# Patient Record
Sex: Male | Born: 1950 | Race: Black or African American | Hispanic: No | Marital: Married | Smoking: Former smoker
Health system: Southern US, Community
[De-identification: ages and names within clinical notes are randomized; demographics above are authoritative.]

## PROBLEM LIST (undated history)

## (undated) DIAGNOSIS — G4730 Sleep apnea, unspecified: Secondary | ICD-10-CM

## (undated) DIAGNOSIS — R634 Abnormal weight loss: Secondary | ICD-10-CM

## (undated) DIAGNOSIS — T8610 Unspecified complication of kidney transplant: Secondary | ICD-10-CM

## (undated) DIAGNOSIS — I119 Hypertensive heart disease without heart failure: Secondary | ICD-10-CM

## (undated) DIAGNOSIS — E785 Hyperlipidemia, unspecified: Secondary | ICD-10-CM

## (undated) DIAGNOSIS — N189 Chronic kidney disease, unspecified: Secondary | ICD-10-CM

## (undated) DIAGNOSIS — E1043 Type 1 diabetes mellitus with diabetic autonomic (poly)neuropathy: Secondary | ICD-10-CM

## (undated) DIAGNOSIS — E1129 Type 2 diabetes mellitus with other diabetic kidney complication: Secondary | ICD-10-CM

## (undated) DIAGNOSIS — Z8619 Personal history of other infectious and parasitic diseases: Secondary | ICD-10-CM

## (undated) DIAGNOSIS — E669 Obesity, unspecified: Secondary | ICD-10-CM

## (undated) DIAGNOSIS — I951 Orthostatic hypotension: Secondary | ICD-10-CM

## (undated) DIAGNOSIS — M1990 Unspecified osteoarthritis, unspecified site: Secondary | ICD-10-CM

## (undated) DIAGNOSIS — K3184 Gastroparesis: Secondary | ICD-10-CM

## (undated) DIAGNOSIS — E13319 Other specified diabetes mellitus with unspecified diabetic retinopathy without macular edema: Secondary | ICD-10-CM

## (undated) DIAGNOSIS — D638 Anemia in other chronic diseases classified elsewhere: Secondary | ICD-10-CM

## (undated) HISTORY — DX: Abnormal weight loss: R63.4

## (undated) HISTORY — DX: Hyperlipidemia, unspecified: E78.5

## (undated) HISTORY — DX: Unspecified osteoarthritis, unspecified site: M19.90

## (undated) HISTORY — DX: Chronic kidney disease, unspecified: N18.9

## (undated) HISTORY — DX: Personal history of other infectious and parasitic diseases: Z86.19

---

## 2002-01-24 ENCOUNTER — Encounter: Payer: Self-pay | Admitting: *Deleted

## 2002-01-24 ENCOUNTER — Inpatient Hospital Stay (HOSPITAL_COMMUNITY): Admission: EM | Admit: 2002-01-24 | Discharge: 2002-01-28 | Payer: Self-pay

## 2002-01-25 ENCOUNTER — Encounter: Payer: Self-pay | Admitting: Internal Medicine

## 2002-01-27 ENCOUNTER — Encounter: Payer: Self-pay | Admitting: Internal Medicine

## 2002-02-01 ENCOUNTER — Ambulatory Visit (HOSPITAL_COMMUNITY): Admission: RE | Admit: 2002-02-01 | Discharge: 2002-02-01 | Payer: Self-pay | Admitting: Vascular Surgery

## 2002-02-01 ENCOUNTER — Encounter: Payer: Self-pay | Admitting: Vascular Surgery

## 2002-03-21 ENCOUNTER — Encounter: Payer: Self-pay | Admitting: Nephrology

## 2002-03-21 ENCOUNTER — Inpatient Hospital Stay (HOSPITAL_COMMUNITY): Admission: AD | Admit: 2002-03-21 | Discharge: 2002-03-22 | Payer: Self-pay | Admitting: Nephrology

## 2002-04-06 ENCOUNTER — Ambulatory Visit (HOSPITAL_COMMUNITY): Admission: RE | Admit: 2002-04-06 | Discharge: 2002-04-06 | Payer: Self-pay | Admitting: Nephrology

## 2002-04-14 ENCOUNTER — Inpatient Hospital Stay (HOSPITAL_COMMUNITY): Admission: RE | Admit: 2002-04-14 | Discharge: 2002-04-14 | Payer: Self-pay | Admitting: Nephrology

## 2002-04-14 ENCOUNTER — Encounter: Payer: Self-pay | Admitting: Nephrology

## 2002-05-06 ENCOUNTER — Encounter: Payer: Self-pay | Admitting: Nephrology

## 2002-05-06 ENCOUNTER — Encounter: Admission: RE | Admit: 2002-05-06 | Discharge: 2002-05-06 | Payer: Self-pay | Admitting: Nephrology

## 2002-06-22 ENCOUNTER — Encounter: Payer: Self-pay | Admitting: *Deleted

## 2002-06-22 ENCOUNTER — Encounter: Admission: RE | Admit: 2002-06-22 | Discharge: 2002-06-22 | Payer: Self-pay | Admitting: *Deleted

## 2002-06-24 ENCOUNTER — Ambulatory Visit (HOSPITAL_COMMUNITY): Admission: RE | Admit: 2002-06-24 | Discharge: 2002-06-24 | Payer: Self-pay | Admitting: *Deleted

## 2002-06-24 ENCOUNTER — Encounter: Payer: Self-pay | Admitting: *Deleted

## 2002-08-10 ENCOUNTER — Ambulatory Visit (HOSPITAL_COMMUNITY): Admission: RE | Admit: 2002-08-10 | Discharge: 2002-08-10 | Payer: Self-pay | Admitting: *Deleted

## 2002-09-14 ENCOUNTER — Ambulatory Visit (HOSPITAL_COMMUNITY): Admission: RE | Admit: 2002-09-14 | Discharge: 2002-09-14 | Payer: Self-pay | Admitting: Vascular Surgery

## 2002-11-17 HISTORY — PX: KIDNEY TRANSPLANT: SHX239

## 2002-12-05 ENCOUNTER — Ambulatory Visit (HOSPITAL_COMMUNITY): Admission: RE | Admit: 2002-12-05 | Discharge: 2002-12-05 | Payer: Self-pay | Admitting: Internal Medicine

## 2002-12-21 ENCOUNTER — Ambulatory Visit (HOSPITAL_COMMUNITY): Admission: RE | Admit: 2002-12-21 | Discharge: 2002-12-21 | Payer: Self-pay | Admitting: Nephrology

## 2002-12-21 ENCOUNTER — Encounter: Payer: Self-pay | Admitting: Nephrology

## 2003-06-02 ENCOUNTER — Ambulatory Visit (HOSPITAL_COMMUNITY): Admission: RE | Admit: 2003-06-02 | Discharge: 2003-06-02 | Payer: Self-pay | Admitting: Nephrology

## 2003-06-02 ENCOUNTER — Encounter: Payer: Self-pay | Admitting: Nephrology

## 2004-02-06 ENCOUNTER — Emergency Department (HOSPITAL_COMMUNITY): Admission: EM | Admit: 2004-02-06 | Discharge: 2004-02-06 | Payer: Self-pay | Admitting: Emergency Medicine

## 2005-03-14 ENCOUNTER — Encounter: Admission: RE | Admit: 2005-03-14 | Discharge: 2005-03-14 | Payer: Self-pay | Admitting: Nephrology

## 2005-07-15 ENCOUNTER — Encounter: Admission: RE | Admit: 2005-07-15 | Discharge: 2005-08-11 | Payer: Self-pay | Admitting: Nephrology

## 2005-09-30 ENCOUNTER — Encounter: Admission: RE | Admit: 2005-09-30 | Discharge: 2005-12-29 | Payer: Self-pay | Admitting: Nephrology

## 2006-03-24 ENCOUNTER — Encounter: Admission: RE | Admit: 2006-03-24 | Discharge: 2006-03-24 | Payer: Self-pay | Admitting: Nephrology

## 2006-10-15 ENCOUNTER — Encounter: Admission: RE | Admit: 2006-10-15 | Discharge: 2007-01-13 | Payer: Self-pay | Admitting: Nephrology

## 2006-10-27 ENCOUNTER — Encounter: Admission: RE | Admit: 2006-10-27 | Discharge: 2006-12-23 | Payer: Self-pay | Admitting: Nephrology

## 2006-11-17 LAB — HM COLONOSCOPY

## 2007-02-18 ENCOUNTER — Encounter: Admission: RE | Admit: 2007-02-18 | Discharge: 2007-02-18 | Payer: Self-pay | Admitting: Nephrology

## 2007-02-21 ENCOUNTER — Encounter: Admission: RE | Admit: 2007-02-21 | Discharge: 2007-02-21 | Payer: Self-pay | Admitting: Nephrology

## 2007-04-13 ENCOUNTER — Ambulatory Visit: Payer: Self-pay | Admitting: Cardiology

## 2007-04-20 ENCOUNTER — Encounter: Admission: RE | Admit: 2007-04-20 | Discharge: 2007-05-18 | Payer: Self-pay | Admitting: Nephrology

## 2007-04-21 ENCOUNTER — Ambulatory Visit: Payer: Self-pay

## 2007-05-04 ENCOUNTER — Ambulatory Visit: Payer: Self-pay

## 2007-05-04 ENCOUNTER — Encounter: Payer: Self-pay | Admitting: Cardiology

## 2007-05-11 ENCOUNTER — Ambulatory Visit: Payer: Self-pay | Admitting: Cardiology

## 2007-08-04 ENCOUNTER — Encounter: Admission: RE | Admit: 2007-08-04 | Discharge: 2007-08-04 | Payer: Self-pay | Admitting: Nephrology

## 2007-09-29 ENCOUNTER — Encounter: Admission: RE | Admit: 2007-09-29 | Discharge: 2007-09-29 | Payer: Self-pay | Admitting: Nephrology

## 2008-07-05 ENCOUNTER — Encounter: Admission: RE | Admit: 2008-07-05 | Discharge: 2008-08-08 | Payer: Self-pay | Admitting: Nephrology

## 2008-08-23 ENCOUNTER — Encounter: Admission: RE | Admit: 2008-08-23 | Discharge: 2008-09-26 | Payer: Self-pay | Admitting: Nephrology

## 2008-10-03 ENCOUNTER — Encounter: Admission: RE | Admit: 2008-10-03 | Discharge: 2008-10-03 | Payer: Self-pay | Admitting: Nephrology

## 2008-10-20 ENCOUNTER — Inpatient Hospital Stay (HOSPITAL_COMMUNITY): Admission: EM | Admit: 2008-10-20 | Discharge: 2008-10-23 | Payer: Self-pay | Admitting: Emergency Medicine

## 2009-02-09 ENCOUNTER — Emergency Department (HOSPITAL_COMMUNITY): Admission: EM | Admit: 2009-02-09 | Discharge: 2009-02-09 | Payer: Self-pay | Admitting: Emergency Medicine

## 2009-02-13 DIAGNOSIS — G4730 Sleep apnea, unspecified: Secondary | ICD-10-CM | POA: Insufficient documentation

## 2009-02-13 DIAGNOSIS — I517 Cardiomegaly: Secondary | ICD-10-CM

## 2009-02-13 DIAGNOSIS — E1143 Type 2 diabetes mellitus with diabetic autonomic (poly)neuropathy: Secondary | ICD-10-CM

## 2009-02-13 HISTORY — DX: Sleep apnea, unspecified: G47.30

## 2009-03-22 ENCOUNTER — Encounter: Admission: RE | Admit: 2009-03-22 | Discharge: 2009-03-22 | Payer: Self-pay | Admitting: Nephrology

## 2009-05-11 ENCOUNTER — Encounter: Payer: Self-pay | Admitting: Cardiology

## 2009-05-30 ENCOUNTER — Ambulatory Visit: Payer: Self-pay | Admitting: Cardiology

## 2009-11-13 ENCOUNTER — Encounter: Payer: Self-pay | Admitting: Cardiology

## 2009-12-04 ENCOUNTER — Encounter: Admission: RE | Admit: 2009-12-04 | Discharge: 2010-03-04 | Payer: Self-pay | Admitting: Family Medicine

## 2010-01-08 ENCOUNTER — Encounter: Payer: Self-pay | Admitting: Cardiology

## 2010-01-10 ENCOUNTER — Encounter: Payer: Self-pay | Admitting: Cardiology

## 2010-04-16 ENCOUNTER — Encounter: Payer: Self-pay | Admitting: Cardiology

## 2010-04-17 ENCOUNTER — Encounter: Payer: Self-pay | Admitting: Cardiology

## 2010-12-17 NOTE — Letter (Signed)
Summary: [facility] Office Note  [facility] Office Note   Imported By: Roderic Ovens 02/07/2010 14:57:13  _____________________________________________________________________  External Attachment:    Type:   Image     Comment:   External Document

## 2010-12-17 NOTE — Letter (Signed)
Summary: [facility] Patient Note   [facility] Patient Note   Imported By: Roderic Ovens 05/21/2010 12:34:04  _____________________________________________________________________  External Attachment:    Type:   Image     Comment:   External Document

## 2010-12-17 NOTE — Letter (Signed)
Summary: [facility] Office Note  [facility] Office Note   Imported By: Roderic Ovens 12/11/2009 11:20:16  _____________________________________________________________________  External Attachment:    Type:   Image     Comment:   External Document

## 2011-02-27 LAB — BASIC METABOLIC PANEL
Calcium: 7.8 mg/dL — ABNORMAL LOW (ref 8.4–10.5)
GFR calc Af Amer: 51 mL/min — ABNORMAL LOW (ref >60)
GFR calc non Af Amer: 42 mL/min — ABNORMAL LOW (ref >60)
Glucose, Bld: 150 mg/dL — ABNORMAL HIGH (ref 70–99)
Potassium: 3.3 mEq/L — ABNORMAL LOW (ref 3.5–5.1)
Sodium: 139 mEq/L (ref 135–145)

## 2011-02-27 LAB — GLUCOSE, CAPILLARY
Glucose-Capillary: 279 mg/dL — ABNORMAL HIGH (ref 70–99)
Glucose-Capillary: 308 mg/dL — ABNORMAL HIGH (ref 70–99)
Glucose-Capillary: 323 mg/dL — ABNORMAL HIGH (ref 70–99)
Glucose-Capillary: 75 mg/dL (ref 70–99)

## 2011-02-27 LAB — URINALYSIS, ROUTINE W REFLEX MICROSCOPIC
Bilirubin Urine: NEGATIVE
Hgb urine dipstick: NEGATIVE
Nitrite: NEGATIVE
Specific Gravity, Urine: 1.017 (ref 1.005–1.030)
pH: 5 (ref 5.0–8.0)

## 2011-02-27 LAB — CBC
HCT: 37.5 % — ABNORMAL LOW (ref 39.0–52.0)
Hemoglobin: 12.5 g/dL — ABNORMAL LOW (ref 13.0–17.0)
RBC: 4.52 MIL/uL (ref 4.22–5.81)
RDW: 13.9 % (ref 11.5–15.5)

## 2011-02-27 LAB — DIFFERENTIAL
Basophils Absolute: 0 10*3/uL (ref 0.0–0.1)
Eosinophils Relative: 2 % (ref 0–5)
Lymphocytes Relative: 12 % (ref 12–46)
Lymphs Abs: 0.6 10*3/uL — ABNORMAL LOW (ref 0.7–4.0)
Monocytes Absolute: 0.8 10*3/uL (ref 0.1–1.0)
Monocytes Relative: 15 % — ABNORMAL HIGH (ref 3–12)
Neutro Abs: 3.8 10*3/uL (ref 1.7–7.7)

## 2011-04-01 NOTE — Discharge Summary (Signed)
[name], [name]             ACCOUNT NO.:  [id number]   MEDICAL RECORD NO.:  [id number]          PATIENT TYPE:  INP   LOCATION:  4741                         FACILITY:  [facility]   PHYSICIAN:  Estelle Grumbles, MDDATE OF BIRTH:  11-06-1951   DATE OF ADMISSION:  10/20/2008  DATE OF DISCHARGE:  10/23/2008                               DISCHARGE SUMMARY   [name] DOCTOR/>  [name], M.D.   CARDIOLOGIST:  Pricilla Riffle, MD, [facility].   PRIMARY CARE PHYSICIAN:  Melvyn Novas, M.D.   DISCHARGE DIAGNOSES:  1. Unresponsiveness secondary to hypoglycemia.  2. Pneumonia likely aspiration pneumonitis.  3. Acute renal failure with history of carotid artery stents status      post renal transplantation.  4. 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  5. Hypertension.   DISCHARGE MEDICATIONS:  1. Avelox 400 mg 1 every twice a day.  2. Meclizine 12.5 mg three times a day as needed.  3. Prednisone 5 mg orally daily.  4. Imdur 30 mg orally twice a day.  5. Zocor 40 mg daily.  6. Lasix 40 mg Monday, Wednesday, and Friday.  7. Omeprazole 20 mg daily.  8. Reglan 10 mg four times a day.  9. CellCept 250 mg orally twice a day.  10.Norvasc 10 mg daily.  11.Cyclosporine 100 mg orally twice a day.  12.Levothyroxine 100 mg twice a day.  13.Aspirin 81 mg daily.  14.Lantus 15 units subcutaneous q.a.m. and 5 units subcutaneous at      bedtime.  Regular insulin according to the sliding scale regimen .   HOSPITAL COURSE:  [name] was admitted to the emergency room with  unresponsiveness.  His blood sugars came down to 40 after taking the  Lantus insulin.  He was on the scene unresponsive, EMS found his state  difficult and brought him to the emergency room with complaints listed  in the history and physical, please review the admitting note done by  Dr. __________ on October 20, 2008.  At the time of admission, he was  also found to have bilateral  basilar interstitial infiltrates concerning  for pneumonitis, suspect of initial pneumonia, which was thought likely  aspiration pneumonitis in view of the prior history of unresponsiveness.  He was started on Avelox.  Nephrology was consulted.  We continued his  home medications.  The patient's Lantus was also adjusted.  Apparently  his Lantus insulin was decreased to 18 units in the morning and then 15  units in the evening, since the blood sugars were repeatedly 90s even  after taking food.  We will decrease the Lantus to 15 units in the  morning and 5 units in the evening.  I have recommend the patient to  hold on to the Lantus if his blood sugars are low, and I also  recommended him to frequently monitor his blood sugars and use  insulin  as needed.  The patient understands and agrees with recommendations.  The patient is discharged home, he is to follow up with his primary care  physician and Dr. Sherrie Mustache.  Estelle Grumbles, MD  Electronically Signed     TP/MEDQ  D:  10/23/2008  T:  10/24/2008  Job:  [id number]   cc:   [name], M.D.  Melvyn Novas, M.D.

## 2011-04-01 NOTE — Assessment & Plan Note (Signed)
HEALTHCARE                            CARDIOLOGY OFFICE NOTE   NAME:Coe, COUNCIL MUNGUIA                    MRN:          [id number]  DATE:04/13/2007                            DOB:          [birth year]    I was asked by Dr. Arrie Aran to evaluate Casimiro Needle Olexa with multiple  cardiac risk factors and a history of congestive heart failure.  He  asked to consult specifically about looking into the possibility of  coronary disease with his multiple risk factors.   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   He has a lot of difficulty getting around, but does become short of  breath with just walking a couple hundred feet.  He denies any chest  pressure, tightness, or other ischemic symptoms.   His risk factors include obesity, type 2 diabetes, hyperlipidemia,  history of hypertension with endstage renal disease, history of renal  transplant, which is stable, remote tobacco, which he quit in 1978.  He  also has a family history of coronary disease in 2 brothers.   MEDICATIONS:  1. Lantus 45 units a day.  2. Labetalol 300 mg p.o. t.i.d.  3. Cellcept 250 mg 4 two times a day.  4. Prednisone 5 mg a day.  5. Metoclopramide 10 mg q.i.d.  6. Isosorbide 30 mg b.i.d.  7. Omeprazole 20 mg daily.  8. Simvastatin 40 mg nightly.  9. Aspirin 81 mg daily.  10.Cyclosporin 100 mg 2 times a day.  11.Lisinopril 20 mg a day.  12.Furosemide 40 mg on Monday, Wednesday, Friday.   ALLERGIES:  1. IV DYE.  2. SHELLFISH.   He gives a history of being hospitalized 6 years ago in [place] with  heart failure.  This was during the stages of his endstage hypertensive  renal disease prior to his transplant.  At that time, he had a lot of  swelling in his ankles and feet.  He does not have much trouble with  that now.   SURGICAL HISTORY:  Negative.   FAMILY HISTORY:   Premature in 2 brothers with coronary disease.   SOCIAL HISTORY:  He is a Actor.  He is married and has 8 children.  His wife is with him today.  They seem very close.   REVIEW OF SYSTEMS:  Other than his HPI, he has gastroesophageal reflux,  and has chronic breathing problems.  He apparently carries a diagnosis  of sleep apnea, but does not wear his CPAP.  He has diabetic neuropathy,  and has a lot of problems with generalized gait disturbances and  weakness.  His MRI of the brain has shown premature atrophy and chronic  microvascular changes.  No stroke or focal mass identified.   EXAMINATION:  His blood pressure is elevated today at 175/84.  His pulse  64 and regular.  His weight is 195. He is 6 feet 1 inch.  HEENT:  Normocephalic and atraumatic.  Premature graying.  He has a  beard.  PERRLA.  Extraocular  movements intact.  Sclerae are muddy.  Facial symmetry is normal.  Dentition is satisfactory.  NECK:  Supple.  Carotids are full without bruits.  There is no  thyromegaly.  Trachea is midline.  LUNGS:  Clear.  HEART:  Reveals a poorly appreciated PMI.  He has normal S1 and S2  without gallop or murmur.  ABDOMINAL EXAM:  Protuberant.  Good bowel sounds.  Organomegaly could  not be adequately assessed.  EXTREMITIES:  Reveal on 1+ pretibial edema.  Pulses are intact, but  reduced.  NEUROLOGIC:  Exam is intact.  SKIN:  Intact.   Electrocardiogram demonstrates sinus rhythm with moderate voltage  criteria for LVH.  He has ST segment changes with T wave inversion in 2,  3, and aVF, as well as V6.  He has early repolarization in other leads.   ASSESSMENT:  1. Dyspnea on exertion with probably coronary artery disease.  He has      multiple risk factors, as outlined above.  2. History of congestive heart failure pretransplant at the time of      his endstage renal disease in [place].  3. Hypertension, under questionable control.  4. Type 2 diabetes.  5. Hyperlipidemia, on a  statin (LDL recently less than 100 at 99, HDL      35, triglycerides elevated).  6. Obesity.  7. Sedentary lifestyle.  8. Remote tobacco.  9. Chronic obstructive sleep apnea with noncompliance.   RECOMMENDATIONS:  1. Adenosine rest/stress Myoview to rule out obstructive coronary      disease.  2. A 2-D echo to assess LV and RV function, as well as LV wall      thickness, and question of diastolic noncompliance.   I have asked him to get an adult blood pressure cuff, and to record  pressures for me.  I will see him back in 3 weeks to review the studies  and look at his blood pressures.  We will make further recommendations  at that time.     Thomas C. Daleen Squibb, MD, [facility]  Electronically Signed    TCW/MedQ  DD: 04/13/2007  DT: 04/13/2007  Job #: [id number]   cc:   [name], M.D.

## 2011-04-01 NOTE — H&P (Signed)
[name], PFALZGRAF NO.:  [id number]   MEDICAL RECORD NO.:  [id number]          PATIENT TYPE:  INP   LOCATION:  4741                         FACILITY:  [facility]   PHYSICIAN:  Acey Lav, MD  DATE OF BIRTH:  October 11, 1951   DATE OF ADMISSION:  10/20/2008  DATE OF DISCHARGE:                              HISTORY & PHYSICAL   duplicate dictation      Acey Lav, MD  Electronically Signed     CV/MEDQ  D:  10/20/2008  T:  10/21/2008  Job:  [id number]

## 2011-04-01 NOTE — H&P (Signed)
[name], [name]             ACCOUNT NO.:  [id number]   MEDICAL RECORD NO.:  [id number]          PATIENT TYPE:  INP   LOCATION:  4741                         FACILITY:  [facility]   PHYSICIAN:  Acey Lav, MD  DATE OF BIRTH:  25-Mar-1951   DATE OF ADMISSION:  10/20/2008  DATE OF DISCHARGE:                              HISTORY & PHYSICAL   PRIMARY CARE PHYSICIAN:  [name], M.D.   CHIEF COMPLAINT:  Unresponsiveness with hypoglycemia and dyspnea and  cough.   HISTORY OF PRESENT ILLNESS:  [name] is a 60 year old African  American gentleman with hypertension, diabetes who underwent a renal  transplant in 2004, followed closely by Dr. Arrie Aran.  He also has  chronic dyspnea and is followed by [facility].  He has felt  relatively well until this morning when he was found unresponsive with a  blood glucose of 30.  The night prior he had strayed from his normal  regimen and after checking his blood sugars at 7 p.m. prior to eating  and then taking insulin based on sliding scale to account for his meal  and for his blood sugar.  He said he ate at 8 p.m. and then checked his  blood sugar at 9 p.m.  He had been getting extra insulin on a sliding  scale for meals.  Later that evening his insulin was in the 90s and he  took 20 units of Lantus at bedtime.  In the morning his blood glucose  was found to be 30 when he was unresponsive and checked by EMS.  EMS  responded, gave him tablets under the tongue and intravenous dextrose  with resuscitation.  He came to finding tablets in his mouth that were  not dissolved.  He coughed vigorously and felt short of breath.  He was  brought to the emergency department where chest x-ray showed bilateral  infiltrates that were read as atelectasis versus infiltrates.  Initial  cardiac enzymes were negative.  EKG did not show any acute changes.  He  was not hypoxic on exam.  He is not febrile initially although his  temperature  later went to 100.3.  He had a VQ scan done which was low  probability for pulmonary embolism.  In the emergency department he was  started on Avelox and Tamiflu.  He notes no sore throat, no sinus  congestion and no cough and told he had this episode of hypoglycemia.  He states he has been eating relatively well.  He did not have fever,  abdominal pain, nausea, vomiting, or diarrhea, blood per rectum until  and had had no new symptoms until his acute hypoglycemic episode.   PAST MEDICAL HISTORY:  1. Kidney transplant 2004.  2. Diabetes mellitus.  3. Hypertension.  4. Hyperlipidemia.  5. Congestive heart failure with diastolic dysfunction.  6. Obstructive sleep apnea.  7. Tobacco use, quit 25 years ago.  8. Secondary hyperparathyroidism.  9. Obesity.   PAST SURGICAL HISTORY:  Transplant done in 2004.   FAMILY HISTORY:  Family history significant for sister dying due to  complications of chronic kidney disease.  Mother died of 63 due to  complications of diabetes. Father died at 19 of heart disease.   SOCIAL HISTORY:  He is married and lives with his wife.  He is retired  from Aeronautical engineer.   ALLERGIES:  INTRAVENOUS DYE and also to SHELLFISH.   CURRENT MEDICATIONS:  1. Meclizine 12.5 mg three times daily.  2. Prednisone 5 mg daily.  3. Isosorbide mononitrate 30 mg twice daily.  4. Zocor 40 mg daily.  5. Lasix 40 mg 1/2 tablet daily.  6. Omeprazole 20 mg daily.  7. Reglan 10 mg four times a day.  8. CellCept 250 mg 4 tablets twice daily.  9. Norvasc 10 mg daily.  10.Cyclosporin 100 mg take two tablets twice daily.  11.Labetalol 300 mg twice daily.  12.Aspirin 81 mg daily.  13.Lantus take 25 units in the morning and 20 units in the evening and      take a sliding scale insulin.   REVIEW OF SYSTEMS:  Review of systems as mentioned in history of present  illness.  Otherwise 10-point review of systems negative.   PHYSICAL EXAMINATION:  VITAL SIGNS:  Good initial vital signs  show blood  pressure 142/68 with a temperature of 97.3, pulse 78, respirations 22,  pulse ox 90% room air.  Temperature rose to 100.3 later during his  hospital stay and blood pressure rose 173/82.  GENERAL:  Pleasant gentleman, fatigued-appearing, slightly overweight.  HEENT: Normocephalic, atraumatic.  He has slightly prominent eyes.  Conjunctivae without exudate or lesions.  Pupils were constricted but  reactive to light.  Oropharynx is clear without exudate or lesion.  He  had no maxillary sinus tenderness.  CARDIOVASCULAR:  Regular rate and rhythm without murmurs, gallops or  rubs.  LUNGS:  Fairly clear to auscultation bilaterally without wheezes,  rhonchi or rales anteriorly.  There were a few crackles at the base  posteriorly.  ABDOMEN:  Soft, nondistended, nontender.  EXTREMITIES:  2+ pretibial edema.   LABORATORY DATA:  EKG showed T-wave inversions in V6, I and aVL which  were chronic and J-point elevation in V2-V3.  Chest x-ray showed  bilateral infiltrates at the bases which were read as atelectasis versus  infiltrates.  His VQ scan was negative.  He did have a positive D-dimer  but again the VQ scan was negative.   His ISTAT showed a creatinine elevated to 1.6, a BUN of 24, sodium 144,  potassium 2.6, chloride 107, glucose 110.  CBC on admission show white  count of 10.7, hemoglobin 11.9, platelets at 206,000 and an ANC of 13.5.  Cardiac markers initially were negative at 1130 hours and also at 1418.  B natriuretic peptide  was 103.  Urinalysis was negative.   ASSESSMENT/PLAN:  1. Unresponsiveness due to hypoglycemia: This is most likely due to      the patient varying from his ordinary routine of taking a meal-      based insulin on a sliding scale later after he had eaten a meal.      He may have also eaten less recently although I could not get this      from his history.  He claimed he had not had a decreased appetite      to me.  He had not had symptoms of fever,  chills or other systemic      symptoms to suggest infection prior to this episode of      hypoglycemia.  I am going to hold his Lantus at present, put  him on      sliding scale insulin, let him eat his regular diet from home.      Will need to adjust his medications upwards as he increases his      intake.  2. Bilateral bibasilar pneumonia versus atelectasis:  My guess is this      patient may have aspirated this morning with this hypoglycemic      event and may have an aspiration pneumonitis versus pneumonia.  I      think Avelox is a reasonable choice with coverage of atypicals      along with anaerobes.  Tamiflu is reasonable although he did not      have much in the way of antecedent symptoms.  He is an immune      compromised patient, however,.  Will follow him closely.  Should he      deteriorate, we will get blood cultures and we will broaden him to      cover staph aureus and gram-negative pathogens such as Pseudomonas.   1. Acute renal insufficiency is most likely prerenal.  We will check a      urine electrolytes, sodium, urea and creatinine.  We will let  Dr.      Arrie Aran of the patient's presence tomorrow morning.  2. Renal transplantation history:  I am going to continue the patient      on CellCept and cyclosporin although he may need some adjustments      based on renal function and we will have to follow his renal      function closely.  Again will talk to Dr. Arrie Aran in the      morning.  I will also continue him on his prednisone.  I am not      increasing the dose to stress dose steroids, given the fact that he      is currently hypertensive and not showing evidence of adrenal      insufficiency at present.  3. Prophylaxis.  I will put the patient on subcutaneous heparin.  4. Code status.  The patient is a full code.      Acey Lav, MD  Electronically Signed     CV/MEDQ  D:  10/20/2008  T:  10/21/2008  Job:  [id number]

## 2011-04-01 NOTE — Assessment & Plan Note (Signed)
[facility]                            CARDIOLOGY OFFICE NOTE   NAME:Berch, TAAJ HURLBUT                    MRN:          [id number]  DATE:05/11/2007                            DOB:          01/19/1951    HISTORY OF PRESENT ILLNESS:  Mr. Searcy comes in today for further  management for his dyspnea on exertion. Please see my evaluation from  Apr 13, 2007.   A 2-D echocardiogram showed left ventricular systolic function to be  normal at 60%. He had moderate left ventricular hypertrophy, probably  secondary to his hypertension. He has mild aortic root dilatation and  moderate dilatation of the left atrium. There was no important valvular  abnormalities.   His stress Myoview demonstrated and EF of 54% with possible inferior  wall hypokinesia. There was soft tissue attenuation versus scarring of  the inferior wall. There was no ischemia.   Despite my asking him to check blood pressures and bring in some  readings today, he has not done so. Unfortunately, his wife is not here  today. She seemed to be willing to take a more active interest in  checking these at the time of our initial visit.   MEDICATIONS:  His medications are unchanged.   PHYSICAL EXAMINATION:  VITAL SIGNS:  Blood pressure 141/77, pulse 62 and  regular. Weight is 295.  HEENT:  Unchanged.  NECK:  Carotid upstrokes are equal bilaterally without bruits. There is  no obvious JVD.  LUNGS:  Reveal inspiratory and expiratory rhonchi.  HEART:  Reveals a poorly appreciated PMI. Normal S1 and S2.  ABDOMEN:  Good bowel sounds.  EXTREMITIES:  1+ edema. Pulses are intact.   ASSESSMENT/PLAN:  At the present time, Mr. Farooq has no objective  evidence of obstructive coronary disease. He does have moderate left  ventricular hypertrophy, which is probably secondary to his  hypertension. I really am concerned about his blood pressure control in  general. I had a long talk with him today about  this. I encouraged him  again to get a large cuff and to check his blood pressures on a regular  basis. He can followup with Dr. Arrie Aran concerning this. I have not  made any changes in the medical program. We will plan on seeing him back  on a p.r.n. basis.   I have also advised him to be more compliant with his sleep apnea. This  could have a favorable effect on his blood pressure, as well as his  cardiac status as well.     Thomas C. Daleen Squibb, MD, [facility]  Electronically Signed    TCW/MedQ  DD: 05/11/2007  DT: 05/11/2007  Job #: [id number]   cc:   [name], M.D.

## 2011-04-04 NOTE — Op Note (Signed)
[place]  Patient:    JEFFRY, VOGELSANG Visit Number: [id number] MRN: [id number]          Service Type: DSU Location: [facility] 2860 [id number] Attending Physician:  Alyson Locket Dictated by:   Larina Earthly, M.D. Proc. Date: 02/01/02 Admit Date:  02/01/2002 Discharge Date: 02/01/2002                             Operative Report  PREOPERATIVE DIAGNOSIS:  End-stage renal disease.  POSTOPERATIVE DIAGNOSIS:  End-stage renal disease.  OPERATION PERFORMED:  Creation of left upper arm arteriovenous fistula.  Lohmeyer:  Larina Earthly, M.D.  ASSISTANT:  Tollie Pizza. Thomasena Edis, P.A.  ANESTHESIA:  MAC.  COMPLICATIONS:  None.  DISPOSITION:  To recovery room stable.  DESCRIPTION OF PROCEDURE:  The patient was taken to the operating room and placed in supine position where the area of the left arm was prepped and draped in the usual sterile fashion.  Using local anesthesia, incision was made over the antecubital space and carried down to isolate the cephalic vein and the brachial artery.  The brachial artery was good with large caliber. The cephalic vein was mobilized proximally and distally and was also of good caliber.  The vein was ligated distally and divided.  This was mobilized to the level of the brachial artery.  The artery was occluded proximally and distally, was opened with an 11 blade and extended longitudinally with Potts scissors.  The vein was cut to the appropriate length, spatulated and sewn end-to-side to the artery with a running 6-0 Prolene suture.  Clamps were removed and excellent thrill was noted.  The wound was irrigated with saline. Hemostasis with electrocautery.  Wound was closed with 3-0 Vicryl in the subcutaneous and subcuticular tissue.  Benzoin and Steri-Strips were applied. ictated by:   Larina Earthly, M.D. Attending Physician:  Alyson Locket DD:  02/01/02 TD:  02/02/02 Job: [id number]

## 2011-04-04 NOTE — Op Note (Signed)
NAME:  [name], [name]                       ACCOUNT NO.:  [id number]   MEDICAL RECORD NO.:  [id number]                   PATIENT TYPE:  OIB   LOCATION:  2899                                 FACILITY:  [facility]   PHYSICIAN:  Caralee Ates, M.D.                  DATE OF BIRTH:  Jun 24, 1951   DATE OF PROCEDURE:  08/10/2002  DATE OF DISCHARGE:  08/10/2002                                 OPERATIVE REPORT   PREOPERATIVE DIAGNOSIS:  End-stage renal disease with left brachiocephalic  fistula that has failed to mature.   POSTOPERATIVE DIAGNOSIS:  End-stage renal disease with left brachiocephalic  fistula that has failed to mature.   PROCEDURE:  Left forearm arteriovenous graft.   [place]:  Caralee Ates, M.D.   ASSISTANT:  Maple Mirza, P.A.   ANESTHESIA:  General endotracheal.   ESTIMATED BLOOD LOSS:  50 cc.   DRAINS:  None.   SPECIMENS:  None.   COMPLICATIONS:  None.   INDICATIONS:  The patient is a 60 year old black gentleman with end-stage  renal disease who had a left brachiocephalic AV fistula placed approximately  six months ago, and they have been unable to dialyze him adequately through  this fistula due to the fact that the fistula is not adequately accessible.  His arm is somewhat large and the fistula is somewhat deep; therefore,  consistent cannulation of the fistula has not been possible.  He was felt to  have good flow through the graft, and conversion to a left forearm AV graft  was felt to be the best course for his ongoing dialysis needs.   DESCRIPTION OF PROCEDURE:  The patient was brought to the operating room and  placed on the operating table in supine position with the left arm extended  on an arm board.  Following adequate IV sedation that was subsequently  converted to general endotracheal anesthesia, the left arm was draped and  prepped in sterile fashion from the axilla to the hand.  A small transverse  incision was made just below the antecubital  fossa on the anterior aspect of  the forearm incorporating the existing scar.  The wound was deepened using  the Bovie to control bleeding.  The cephalic vein where it had been  transposed to the brachial artery was identified, mobilized, and encircled  with a vessel loop.  The vein was followed down to the level of the brachial  artery.  The brachial artery was then mobilized proximally and distally to  the anastomosis.  The artery was encircled with vessel loops in Potts  fashion on the proximal and distal end.  Next a counter incision was made on  the forearm and a 4-7 tapered PTFE graft was then tunneled in the  subcutaneous plane using the Gore tunneler.  The patient was then  systemically heparinized.  Following adequate three-minute circulation time  the loops from the brachial artery were tightened  to occlude flow.  The  anastomosis was taken down and the venous outflow tract was instilled with  heparinized saline.  Next the arterial wall was debrided back to healthy-  appearing tissue, removing all hyperplastic tissue.  The 4 mm end of the  PTFE graft was then spatulated and sewn in position in an end-to-side  fashion with running 6-0 Prolene suture.  Following completion of the  anastomosis, a clamp was placed on the graft and the loops were released,  restoring flow.  There was some bleeding from the anastomotic suture line,  and this was controlled with direct pressure.  Next the cephalic vein was  then debrided back to healthy-appearing vein where no hyperplastic tissue  was seen.  The 7 mm end of the graft was then trimmed to the appropriate  length and sewn in position in end-to-end fashion with running 6-0 Prolene  suture.  Prior to completion of the anastomosis the graft was flushed and  the vein was back bled.  The anastomosis was then completed and flow was  restored.  Thirty milligrams of protamine was administered, and hemostasis  was achieved.  Both wounds were then  closed in layers with 3-0 and 4-0  Vicryl sutures.  Sterile dry dressings were applied.  The patient was then  awakened from anesthesia and transferred to the recovery room in stable  condition.  The patient tolerated the procedure well.  No complications.  All needle and sponge counts were correct.                                               Caralee Ates, M.D.    Exie Parody  D:  08/10/2002  T:  08/11/2002  Job:  [id number]   cc:   Irena Cords, M.D.

## 2011-04-04 NOTE — Discharge Summary (Signed)
[facility]  Patient:    [name], [name] Visit Number: [id number] MRN: [id number]          Service Type: MED Location: [id number] [phone] Attending Physician:  [name] Dictated by:   [name], M.D. Admit Date:  01/24/2002 Discharge Date: 01/28/2002   CC:         [name], M.D.,  [facility]  [name], M.D.   Discharge Summary  DISCHARGE DIAGNOSES: 1. Congestive heart failure exacerbation secondary to fluid overload, likely    diastolic dysfunction, resolved.  History of heart failure with a normal    two-dimensional echocardiogram during this admission (two-dimensional    echocardiogram done on January 25, 2002, with normal left ventricular size,    with normal overall left ventricular systolic function, ejection fraction    estimated to be between 55 to 65%, absent of left ventricular regional wall    motion abnormalities with mild mitral regurgitation. 2. Uncontrolled hypertension - improved.  Admitting blood pressure 150/88.    Blood pressure on discharge 102/80. 3. Uncontrolled type 2 diabetes mellitus, insulin dependent - improved.    Complicated by gastroparesis and nephropathy with episodes of hypoglycemia,    capillary blood glucoses mainly in the high 150s prior to discharge.  DISCHARGE DIAGNOSES: 1. Chronic renal insufficiency secondary to diabetes mellitus and    hypertension.  Baseline creatinine between 6 and 7.  Status post left    femoral AV shunt in October 2002 in [place].  Discharge    creatinine 7.1. 2. Macrocytic anemia with a hemoglobin of 11.6 and MCV of 99 on admission.    Likely, anemia is multifactorial including the chronic renal    insufficiency, stool guaiac was negative during hospitalization.    Patient is on Epogen therapy. 3. Remote history of cigarette smoking, quit 25 years ago, lastly allergic to    intravenous dye and shellfish.  MEDICATIONS ON DISCHARGE:  Patient is  going to take all his medications prior to admission with the following adjustments:  He has been instructed to stop the Bumex that he was taking prior to admission.  His hydralazine has been increased to 100 mg p.o. t.i.d.  His Procardia XL has been increased to 90 mg p.o. q.d.  He has been started on Lasix, a new medicine, 18 mg, two tablets t.i.d.  He is going to continue the rest of his medications as previously. His medications are: 1. Insulin 70/30 13 units in the morning and 10 units in the evening. 2. PhosLo 67 mg t.i.d. 3. Imdur 30 mg p.o. q.d. 4. Cardura 2 mg p.o. q.h.s. 5. Reglan 5 mg p.o. a.c. and h.s.  DISCHARGE LABS:  WBC count 5.2, hemoglobin 11.2, hematocrit 34.6, MCV 78.9, platelet count 278, sodium 135, potassium 3.8, chloride 98, bicarbonate 28, glucose 119, BUN 79, creatinine 7.1.  PROCEDURES:  A 2-D echocardiogram.  CONSULTANTS:  [name], M.D., of [facility].  CONDITION ON DISCHARGE:  Improved.  ACTIVITY:  As tolerated.  DIET:  A 2000-calorie ADA and cardiac diet as previously.  INSTRUCTIONS:  Call physician if he experiences any dizziness, shortness of breath, nausea, vomiting, or chest pain.  Follow up in one month will be with [name] of [facility], address as mentioned above, on February 02, 2002, at 11 a.m.  Patient should call Dr. Heloise Rowland office as discussed with him previously by Dr. Darrick Rowland for follow-up appointment.  I have given patient Dr. Heloise Rowland office number, [phone].  She will  then need to speak to him prior to this appointment.  HISTORY OF PRESENT ILLNESS:  The patient is a pleasant 60 year old gentleman who was visiting from [place] looking for a home in [place], thinking of relocating in this area, who presented with a three-day history of worsening shortness of breath, orthopnea, GI cough, and dyspnea on exertion.  The patient stated on admission that, over the last one week prior to  admission, he has had meals with slightly increased salt content that were not recommended by his primary caregiver in [place].  He denied any history of chills, hemoptysis, or chest pain.  At the time of admission, he admitted to increasing lower extremity edema as well as increasing abdominal gas.  PHYSICAL EXAMINATION AT TIME OF ADMISSION:  GENERAL:  He was alert and oriented x 3.  VITAL SIGNS:  Blood pressure 186/88 with a heart rate of 84, respiratory rate of 20, oxygen saturation of 92% on room air with a temperature of 97.3.  HEENT:  Neck exam notable for increased JVD at about 2 cm at 30 degrees.  LUNGS:  He had bilateral crackles up to upper third bilaterally on chest exam with fair air movement without any wheezes and a 1/6 systolic ejection murmur at the base without any gallops.  LABORATORY DATA:  X-ray done revealed cardiomegaly with pulmonary vascular congestion.  An EKG showed normal sinus rhythm without any acute ST wave changes.  Patient was therefore admitted for evaluation and management of history of decompensation.  HOSPITAL COURSE:  Patient was started on aggressive diuresis with Lasix up to 160 mg p.o. t.i.d., IV initially then subsequently switched to p.o. with monitoring of his electrolytes for obvious reasons.  He was also admitted to a telemetry bed and a cardiac enzymes was obtained to rule out any possible myocardial ischemia, ______ ______factor which was negative.  He had a 2-D echo which did not reveal any systolic dysfunction at, at this point, I believe that the likely cause of the heart failure is diastolic dysfunction - patient is currently on calcium channel blocker, that is, Procardia XL at 90 mg q.d.  At the time of discharge, patient was doing well on p.o. diuresis.  He was not  experiencing anymore decompensation or shortness of breath.  His x-rays have improved compared to x-rays on admission with respect to the pulmonary vascular  congestion.  In view of his renal insufficiency, we called renal to assist with this evaluation.  Dr. Darrick Rowland was good enough to see the patient while he was in house and we determined that his renal insufficiency was not new and there was no acute progression.  Hemodialysis was therefore deferred and patient will be followed up in [facility] by Dr. Heloise Rowland office with respect to initiation of hemodialysis in the future.  He has an ______ level which will need to be revised by CVTS during outpatient visits for possible hemodialysis in the very near future.  Next is uncontrolled hypertension.  Patients blood pressures were very high around a systolic of 180 to 190s.  At the time of discharge, his systolic blood pressure was 150 on multiple medications.  He would need an outpatient follow-up of his blood pressure and his medications would be titrated as needed since he is a diabetic and his optimum blood pressure has not yet been achieved, but I believe this can be done as an outpatient.  Patient will follow up with [name] as mentioned above on March 19 at 11 a.m.  at which time his electrolytes should be checked in view of his aggressive diuresis and the diuretic medication adjusted as needed.  He will also need evaluation of his blood pressures and medications, antihypertensive adjusted as needed, as well.  Patient is a diabetic and, therefore, his target systolic blood pressure should be somewhere around 120 mmHg. Dictated by:   [name], M.D. Attending Physician:  [name] DD:  01/28/02 TD:  01/29/02 Job: [id number]

## 2011-04-04 NOTE — Op Note (Signed)
[place]  Patient:    [name], [name] Visit Number: [id number] MRN: [id number]          Service Type: MED Location: [phone] Attending Physician:  [name] Dictated by:   Llana Aliment. Deterding, M.D. Admit Date:  03/21/2002 Discharge Date: 03/22/2002   CC:         [name]   Operative Report  PROCEDURE:  [name] Rowland insertion.  INDICATION:  Access for hemodialysis.  DESCRIPTION OF PROCEDURE:  The procedure explained to the patient and both benefits and risk were understood and accepted.  The patient was taken to the fluoroscopy suite, placed on the fluoroscopy table in supine position with a towel roll between his shoulders.  Initially the internal jugular vein was located in the middle one-third of the sternocleidomastoid triangle with the Site-Rite ultrasound device to locate the patency, depth, and position. Subsequently the right side of the neck and right upper chest were prepped with Betadine.  Xylocaine 2% local anesthesia was used to numb up the area over the sternocleidomastoid triangle and in an inferolateral fashion approximately 8 x 4 cm.  Using the Site-Rite ultrasound device in a sterile fashion, the internal jugular vein was cannulated with the 18-gauge thin-walled needle and a J-tipped guidewire advanced through that 18-gauge thin-walled needle into the internal jugular vein, superior vena cava, and inferior vena cava under fluoroscopic guidance.  The 18-gauge thin-walled needle was withdrawn, and the opening where the guidewire entered the skin was dilated by sharp dissection approximately 1 cm.  Serial dilators were placed over the guidewire under fluoroscopic guidance starting with a 10 [place], then going through 12 and 14 [place] to lessen resistance in the skin, subcutaneous tissue, and vessel wall.  Subsequently an 57 French trocar with tear-away sheath was placed over the guidewire and  advanced into the internal jugular vein and superior vena cava under fluoroscopic guidance.  A nonserrated dialysis clamp was placed around the trocar and sheath just outside the skin. Both lumens of the double-lumen 16 cm [name] were flushed with saline, clamped, and screw-in tunneling devices placed in the distal ends.  The trocar was then withdrawn from the sheath, the sheath clamped with a dialysis clamp, and both lumens of the double-lumen Schoen catheter started through the distal end of the sheath with the venous lumen being lateral.  The sheath was unclamped then, and the catheter was advanced through the remainder of the sheath into the internal jugular vein and superior vena cava with the tips coming to rest in the superior vena cava-right atrial junction.  The tear-away sheath was removed and subsequently the guidewire removed.  Position was confirmed.  The tunneling device was then advanced in a 180 degree arc to facilitate tunnel formation.  Two parallel curvilinear tunnels were formed, initially in a superior direction, then lateral, then inferior, with the tips coming out 3 cm inferior to the clavicle and 7 cm inferolateral to the vein entry, 2 cm apart.  The catheters were positioned, and it was confirmed there were no kinks in the catheters.  The catheters were then clamped, cut off, and friction screw-in connecting devices applied to the dsital ends.  The catheter was then flushed and heparin-locked with concentrated heparin.  The skin over the vein entry was closed using 3-0 silk, and a pursestring suture was taken around each catheter with 3-0 silk also.  A Hypafix dressing was applied. Dictated by:   Llana Aliment. Deterding, M.D.  Attending Physician:  [name] DD:  03/21/02 TD:  03/23/02 Job: [id number]

## 2011-04-04 NOTE — H&P (Signed)
[facility]  Patient:    [name], [name] Visit Number: [id number] MRN: [id number]          Service Type: MED Location: 3W [phone] Attending Physician:  Rosanne Sack Dictated by:   Rosanne Sack, M.D. Admit Date:  01/24/2002   CC:         [facility]  Garnetta Buddy, M.D.  James L. Deterding, M.D.   History and Physical  DATE OF BIRTH:  Jan 13, 1951  PROBLEM LIST:  1. Congestive heart failure exacerbation secondary to fluid overload.     a. History of congestive heart failure, no information about ejection        fraction, no history of coronary artery disease.  2. Uncontrolled hypertension, blood pressure 186/88.  3. Uncontrolled type 2 diabetes mellitus, insulin-dependent.     a. Currently hypoglycemia.     b. Gastroparesis.     c. Nephropathy.  4. Chronic renal insufficiency secondary to diabetes hypertension.     a. Baseline creatinine between 6.0 and 7.0.     b. Status post left forearm arteriovenous shunt in October 2002, in [place].  5. Microcytic anemia.     a. Hemoglobin 11.6, MCV 79.     b. On iron supplementation, question on EPO therapy.  6. Recent upper respiratory symptoms about two weeks ago, treated with     antibiotic therapy.  7. History of occasional chest pain, question of unstable angina, no cardiac     workup done in the past.  8. Obesity.  9. Remote history of smoking, quit about 25 years ago. 10. Allergy to INTRAVENOUS DYE and SHELLFISH.  CHIEF COMPLAINT:  Shortness of breath.  HISTORY OF PRESENT ILLNESS:  [name] is a very pleasant 60 year old gentleman visiting from [place] who presents with a three-day history of orthopnea, dry cough, and increased dyspnea on exertion.  The patient states that over the last week or so he has had meals with slightly increased salt content that were not recommended by his primary care Maeley Matton.  The patient denies fever,  chills.  No hemoptysis.  No chest pain.  He describes lower extremity edema.  He complains of increased abdominal girth though no abdominal pain.  The patient complains of constipation.  No night sweats, no weight loss.  No increased lower extremity swelling from baseline.  The patient has been here in [place] for already a week.  No lower extremity pain or unusual swelling or skin rash.  No back pain.  No chest pain.  No focal weakness or swallowing problems.  No urinary symptoms.  PAST MEDICAL HISTORY:  As in problem list.  ALLERGIES:  As in problem list.  MEDICATIONS:  1. Insulin 70/30, 30 units in the morning, 10 units in the evening if needed     only.  2. PhosLo 667 mg t.i.d.  3. Isosorbide 30 mg p.o. q.d.  4. Nifedical XL 60 mg p.o. q.d.  5. Doxazosin 2 mg p.o. q.h.s.  6. Zaroxolyn 2.5 mg, 1-2 tablets q.d.  7. Bumex 2 mg p.o. q.d.  8. Hydralazine 10 mg p.o. b.i.d.  9. Reglan 5 mg p.o. a.c. and h.s. 10. Iron sulfate 325 mg p.o. t.i.d.  FAMILY HISTORY:  Significant for diabetes.  All his brothers and sisters, along with his parents, had diabetes.  The same thing with hypertension; everybody has hypertension.  No strokes in the family.  One of his brothers had an acute MI  when he was 35 years old.  No malignancy in the family.  SOCIAL HISTORY:  The patient is married for the second time.  He has six grown children from the first marriage.  He quit smoking about 25 years ago.  No alcohol use.  The patient is disabled.  REVIEW OF SYSTEMS:  As in HPI.  PHYSICAL EXAMINATION:  VITAL SIGNS:  Temperature 97.3, blood pressure 186/88, heart rate 84, respiratory rate 20, oxygen saturation 92% on room air.  HEENT:  Normocephalic, atraumatic.  Sclerae clear.  Conjunctivae within the normal limits.  PERRLA.  EOMI.  Funduscopic exam negative for papilledema or hemorrhages.  TMs within the normal limits.  Moist mucous membranes. Oropharynx clear.  NECK:  Supple.  JVD is present  about 30 degrees, about 2 cm.  No bruits, adenopathy, or thyromegaly.  LUNGS:  Bilateral crackles one-third up bilaterally.  No wheezing.  Fair air movement bilaterally.  CARDIAC:  Irregular rate and rhythm with 1/6 systolic ejection murmur at the base.  No S3.  No rubs.  ABDOMEN:  Obese, nontender, nondistended.  Bowel sounds were present.  No hepatosplenomegaly, no rebound, no guarding.  No bruits or masses.  GENITOURINARY:  Within the normal limits.  RECTAL:  Empty vault.  Normal sphincter tone.  Normal prostatic exam.  EXTREMITIES:  With 2+ pitting edema bilaterally.  No cord signs.  No tenderness.  No cyanosis or clubbing.  Pulses 1+ bilaterally.  The left forearm has a fistula that is ______.  NEUROLOGIC:  Alert and oriented x3.  Strength 5/5 in all extremities.  DTRs 3/5 in all extremities.  Cranial nerves II-XII intact.  Sensory intact. Plantar reflexes downgoing bilaterally.  LABORATORY DATA:  Chest x-ray consistent with cardiomegaly and pulmonary edema.  EKG:  Normal sinus rhythm with a heart rate of 80, normal axis.  No ST segment changes.  T-wave inversion in inferior leads.  No previous EKG to compare with.  No Q-waves.  CK 555, CK-MB 5.3, troponin I pending.  Sodium 138, potassium 3.8, chloride 108, CO2 24, BUN ______ , creatinine 6.2, calcium 8.0, total protein 7.6, albumin 3.6, SGOT 16, SGPT 12, alkaline phosphatase 56, total bilirubin 0.5. Hemoglobin 11.6, MCV 79, WBC 4.6, platelets 268.  ASSESSMENT AND PLAN: 1. Congestive heart failure exacerbation:  Given as described in the history    of present illness section, I suspect fluid overload associated with    chronic renal insufficiency along with increased salt intake as the main    reason for the patients congestive heart failure.  Also, a possible     cardiomyopathy could be present given the patients history of diabetes    hypertension.  There is no previous history of alcohol use or abuse nor     illegal drug use.  Currently, the patient denies any chest pain.  The    electrocardiogram is nonischemic.  The first set of cardiac enzymes is    negative.  Our plan is to admit the patient to a telemetry bed for    aggressive diuresis.  Will start Lasix 120 mg IV q.6h. and will keep    Zaroxolyn.  I spoke with Dr. Hyman Hopes, nephrologist, who agreed with this plan.    Currently the renal function seems to be stable, and the serum potassium    level is within the normal limits.  Will follow up the fluid balance along    with the daily weights.  At this point there is no need for hemodialysis as  the patient has had a positive response to a low dose of Lasix given by the    emergency room physician.  For completion, will obtain the cardiac enzymes    x 3 to rule out myocardial infarction, and a two-dimensional echocardiogram    will be obtained in the morning.  Further information will be obtained from    the patients primary care Aitana Burry in [place] tomorrow morning. 2. Uncontrolled hypertension:  His blood pressure is likely to be associated    with a fluid overload and the stress associated with his congestive heart    failure.  Our plan is aggressive diuresis as described in problem #1.  Will    continue hydralazine, doxazosin, the calcium-channel blocker, and    Zaroxolyn that the patient was taking from home.  Will continue following    up the patients blood pressure closely. 3. Uncontrolled diabetes mellitus:  Currently his CBG is 60 without clinical    symptoms of hypoglycemia.  The patient has had decreased p.o. intake due    to problem #1.  For now, will decrease the 70/30 from 30 units to 20 units.    Will use sliding scale a.c. and h.s. for now. 4. Chronic renal insufficiency:  The creatinine is currently at baseline. As    described in problem #1, the patient has a fair urine output and,    therefore, hemodialysis is not indicated at this point.  Will continue    following  closely the renal function along with the serum potassium level    with aggressive diuresis.  A nephrology consult has already been obtained. 5. Microcytic anemia:  There is no evidence of gastrointestinal bleed.  I    suspect a combination of anemia due to chronic renal insufficiency and    iron deficiency.  Will continue the iron supplementation.  Will obtain    the patients primary care providers records about the need of continuing    EPO shots.  Will guaiac stools.  Will repeat another hemoglobin in the    morning. Dictated by:   Rosanne Sack, M.D. Attending Physician:  Rosanne Sack DD:  01/24/02 TD:  01/25/02 Job: [id number]

## 2011-04-04 NOTE — H&P (Signed)
[place]  Patient:    [name], [name] Visit Number: [id number] MRN: [id number]          Service Type: MED Location: [phone] Attending Physician:  Irena Cords Dictated by:   Irena Cords, M.D. Admit Date:  03/21/2002                           History and Physical  REASON FOR ADMISSION:  Uremia, congestive heart failure.  HISTORY OF PRESENT ILLNESS:  The patient is a 60 year old African-American male with a past medical history significant for longstanding diabetes mellitus, complicated by retinopathy and nephropathy, who has had progressive renal insufficiency over the last two years.  He also has a longstanding history of hypertension, and was most recently admitted in March 2003, with congestive heart failure and worsening renal function.  He has been followed as an outpatient since his last discharge in March, in which his creatinine had peaked to 8.1, and was near starting dialysis.  He has improved with aggressive IV diuretics; however, his renal function has still remained less than 20 cc a minute.  Since he has been following up, he has had progressive increase in his lethargy, weakness, fatigue, and now with increasing nausea. He also reports that last night he had to sleep in a rocking chair because he could not lie flat due to shortness of breath and a choking sensation.  He was instructed to report to our clinic this morning for stat labs and an evaluation.  At the present time he denies any chest pain, fevers, or chills, but does have some increasing nausea.  No vomiting.  He has some orthopnea. He denies any dysuria, pyuria, or hematuria.  PAST MEDICAL HISTORY  1. Diabetes mellitus, insulin-requiring, longstanding for 25 years.  2. Chronic kidney disease, secondary to diabetic nephropathy and     hypertension.  He has a left forearm AV fistula that was immature;     however, a revision done of his left  upper extremity on February 01, 2002.  His most recent creatinine was 7.  3. Congestive heart failure and diastolic dysfunction.  4. Hypertension.  5. Anemia of chronic kidney disease.  6. Tobacco abuse, quit 25 years ago.  7. Secondary hyperparathyroidism, with PTH of 155.  8. Obesity.  CURRENT MEDICATIONS  1. Hydralazine 100 mg t.i.d.  2. Procardia XL 90 mg q.d.  3. Lasix 80 mg q.d.  4. Insulin 70/30, 25 units q.a.m., 10 units q. evening.  5. PhosLo 667 mg, one q.a.c.  6. Imdur 30 mg q.d.  7. Cardura 4 mg b.i.d.  8. Prevacid 15 mg q.d.  9. Multivitamin one q.d. 10. Epo 10,000 units subcu q. week.  ALLERGIES:  IV DYE and SHELL FISH.  FAMILY HISTORY:  He has a family history significant for a sister dying of complications of kidney disease.  Mother died at age 62 due to complications of diabetes mellitus.  Father died at age 33 of heart disease.  He has 14 siblings, and all but one have diabetes mellitus.  Only 11 siblings are alive. He has a positive family history of heart disease.  SOCIAL HISTORY:  He is married.  He recently moved here from [place].  He is retired from a [employer].  REVIEW OF SYSTEMS:  As per the HPI.  GENERAL:  He reports anorexia and a decreased appetite.  OPHTHALMIC:  Denies  any blurred vision, photophobia. ENT:  Denies any tinnitus, dysphagia, or odynophagia.  CARDIAC:  Denies any palpitations or chest pain, but does have orthopnea and increased dyspnea on exertion.  PULMONARY:  Has some shortness of breath.  Denies any hemoptysis or productive cough.  GI:  Has some nausea, but no vomiting, no hematochezia, melena, or bright red blood per rectum.  GENITOURINARY:  Denies any dysuria, pyuria, or hematuria.  MUSCULOSKELETAL:  Denies any arthralgias or myalgias. DERMATOLOGIC:  Denies any rashes, lumps, or bumps.  HEMATOLOGIC:  Denies any abnormal bleeding or bruising.  ENDOCRINE:  Denies any polyuria, polydipsia, or polyphagia.   All other systems are negative.  PHYSICAL EXAMINATION  GENERAL:  He is a well-developed, obese man, who appears in mild distress.  VITAL SIGNS:  Blood pressure 164/68, pulse 90, weight 289 pounds.  HEENT:  Head normocephalic, atraumatic.  Oropharynx without lesions.  NECK:  Supple, full range of motion.  No lymphadenopathy.  LUNGS:  Bilateral crackles one-third of the way up.  CARDIAC:  A regular rate and rhythm.  No precordial rub appreciated.  ABDOMEN:  Normoactive bowel sounds, soft, nontender, obese.  EXTREMITIES:  He has 1-2+ pretibial edema bilaterally to the knees.  In his left upper extremity he has a forearm AV fistula which is immature.  He has a left upper extremity AV fistula with a palpable thrill and audible bruit, but is immature and thin-walled.  LABORATORY DATA:  At this time are pending.  ASSESSMENT/PLAN 1. Progressive nausea, vomiting, and shortness of breath.  It appears    that the patient has had progressive kidney disease and is currently    having signs and symptoms of uremia.  PLAN:  We are awaiting labs at this time; however, the plan is to admit the patient to telemetry and to establish access for hemodialysis through the use of a Diateck permanent catheter and initiate hemodialysis with volume removal at this time.  Will also rule out for a myocardial infarction, given his progressive symptoms.  2. Hypertension.  The blood pressure is poorly controlled.  PLAN:  Will follow after the initiation of hemodialysis with ultrafiltration.  3. Anemia.  PLAN:  Will continue with Epo, and will convert him to IV following dialysis.  5. Secondary hyperparathyroidism.  PLAN:  Will continue to follow calcium and phosphorus.  Will hold off on vitamin D for now, as his level is less than 150.  Dictated by:   Irena Cords, M.D. Attending Physician:  Irena Cords DD:  03/21/02 TD:  03/21/02 Job: [id number]

## 2011-04-04 NOTE — Consult Note (Signed)
[facility]  Patient:    [name], [name] Visit Number: [id number] MRN: [id number]          Service Type: MED Location: [phone] Attending Physician:  Jackie Plum Dictated by:   Llana Aliment. Deterding, M.D. Admit Date:  01/24/2002 Discharge Date: 01/28/2002   CC:         Gerarda Fraction, M.D.   Consultation Report  REASON FOR CONSULTATION:  Chronic renal insufficiency with congestive heart failure.  CONSULTING PHYSICIAN:  Jackie Plum, M.D.  HISTORY OF PRESENT ILLNESS:  This is a 60 year old gentleman with an over 25-year history of diabetes mellitus, 7-8 year history of hypertension, history of progressive renal insufficiency, known at least 2 years. These last 2 years have been very complicated by at least 10 episodes of congestive heart failure requiring hospitalization. He is followed by a nephrologist at a primary care physician in [place]. He had fistula placed in October 2002, which the Feng said it was "okay for use." He knows the dialysis was imminent, however, was told last month that his kidneys were better and he does not need it soon. He is moving to [place] and is down here to find a place to live; he is visiting with his relatives. He has now had 4 days of progressive shortness of breath with PND, orthopnea, having to sit up to breathe, short of breath with any exertion. He has had a cough without phlegm. He has had noticed increased ankle edema and increased abdominal girth. He has had no fevers or chills, nausea, vomiting, or muscle cramping. He has had some itching.  He has no dysuria or hematuria. He has had no history of stones or urinary tract infections or veneral disease. He has a positive family history of renal disease in a sister who died of complications of kidney disease. Mother died at the age of 6 with complications of diabetes. Father died at age 4 of heart disease. He has 14 siblings, and all  but 1 have diabetes; only 11 are alive. He has a positive family history of heart disease.  He has had a history of chest pain on exertion and is on Isordil.  MEDICATION REGIMEN:  1. Nifedipine XL 60 mg b.i.d.  2. Insulin 73, 30 units in the morning, 10 units h.s.  3. Hydralazine 100 mg b.i.d.  4. PhosLo 667 mg t.i.d.  5. Isordil 30 mg per day p.o.  6. Reglan 10 mg b.i.d.  7. Zaroxolyn 2.5 mg a day.  8. Bumex 2 mg b.i.d.  9. Doxazosin 12 mg h.s. 10. Iron sulfate b.i.d. p.o. 10,000 units each week. 11. He has been getting the EPO for about a year. 12. He has a CPAP mask at night. He has to do that because he     has sleep apnea.  REVIEW OF SYSTEMS:  HEENT:  He wears glasses; has had cataracts but denies any laser therapy. HEARING:  No abnormalities. He has a dry mouth. PULMONARY:  He smoked about 5 years almost 30 years ago but has not smoked since. He does has the sleep apnea. He has no history of asthma. GI:  He occasionally has indigestion or heartburn; occasional constipation. No history of hepatitis or yellow jaundice. Skin is very dry, and he has a lot of itching. MUSCULOSKELETAL:  No real arthritis. He has been lucky from that standpoint, he says. NEUROLOGICAL:  Unremarkable. He says his sugars usually run relatively low lately in the 70-120 range.  PAST MEDICAL  HISTORY:  Includes the illnesses listed above.  PAST SURGICAL HISTORY:  He has had no operations except for the fistula.  ALLERGIES:  SHELLFISH and IODINE.  FAMILY HISTORY:  As listed above.  SOCIAL HISTORY:  He is retired from a Barista. He is here visiting relatives.  OBJECTIVE/PHYSICAL EXAMINATION:  GENERAL:  He is edematous. He is on oxygen. He is not short of breath sitting.  VITAL SIGNS:  Blood pressure sitting is 184/88 going to 196/94 standing.  HEENT:  Diabetic retinopathy that is mild.  CARDIOVASCULAR:  Regular rhythm, S4. Grade 3/6 whole systolic murmur, heard best at  the apex. PMI is 14 cm lateral to the mid sternal line at the 5th intercostal space. There is 3-4+ edema. No bruits noted. He has 1+ dorsalis pedis pulses.  LUNGS:  Crackles in both bases to the mid lungs.  ABDOMEN:  Obese, positive bowel sounds. Liver is down 7 cm.  SKIN:  Somewhat dry.  MUSCULOSKELETAL:  Unremarkable.  EXTREMITIES:  He has an AV fistula on examination on his left forearm which is very small and a ________ fistula.  LABORATORY AND ACCESSORY DATA:  Hemoglobin 11.2, white count 5200, platelets 278,000. Chemistries reveal sodium 138, potassium 3.7, chloride 107, bicarb 26, creatinine 6.3, BUN 64, glucose 154, albumin 3.5, calcium 8. CK 555 with MBs serially of 5.3, 4.6, and 4.7.  Chest x-ray shows CHF.  ASSESSMENT: 1. Chronic renal failure, apparently progressive diabetic nephropathy,    no records to substantiate this. He has had really recurrent    episodes of congestive heart failure, and getting salt water excesses.    His GFR is only 15 cc a minute. He is not uremic at this time, but he    has volume overload. His acid-base status is okay. He is anemic. He    will need maximal diuretic doses with his decreased GFR. He may need    hemodialysis soon. His AV fistula seems adequate and we have asked    CVTS to see him. He will need that revised as soon as possible. That    will have to be done at [facility] because of the lack of    vascular services at [facility]. Need to assess more of his    baseline status in terms of his anemia and renal function. 2. Hypertension. Volume excess is one of the big issues here as he is    20-30 pounds up. 3. Positive CK, needs workup. Must worry if he has had a serially    depressed ejection fraction with this recurrent congestive heart failure. 4. Diabetes mellitus. Insulin needs will be decreasing with his    decreasing GFR. 5. Anemia. Need to check his iron TIBC. 6. Setting of hyperthyroidism.  7.  Sleep apnea. 8. Obesity.  PLAN: 1. Maximize his diuresis. 2. CVTS to see him. 3. Iron TIBC. 4. PTH. 5. Use a large blood pressure cuff. 6. Diet. 7. Serum and urine protein electrophoresis. 8. We need his records at this point. 9. Cardiac workup.  ADDENDUM:  We may have to transfer him to [facility] to get this surgery done, if he is still in the hospital. Dictated by:   Llana Aliment. Deterding, M.D. Attending Physician:  Jackie Plum DD:  01/25/02 TD:  01/26/02 Job: [id number]

## 2011-08-21 LAB — CBC
HCT: 32.8 % — ABNORMAL LOW (ref 39.0–52.0)
HCT: 36.7 % — ABNORMAL LOW (ref 39.0–52.0)
Hemoglobin: 10.3 g/dL — ABNORMAL LOW (ref 13.0–17.0)
Hemoglobin: 10.8 g/dL — ABNORMAL LOW (ref 13.0–17.0)
MCHC: 32.3 g/dL (ref 30.0–36.0)
MCHC: 32.5 g/dL (ref 30.0–36.0)
MCHC: 32.7 g/dL (ref 30.0–36.0)
MCHC: 33 g/dL (ref 30.0–36.0)
MCV: 86.1 fL (ref 78.0–100.0)
MCV: 86.3 fL (ref 78.0–100.0)
MCV: 86.5 fL (ref 78.0–100.0)
MCV: 86.8 fL (ref 78.0–100.0)
Platelets: 199 10*3/uL (ref 150–400)
Platelets: 206 10*3/uL (ref 150–400)
RBC: 3.63 MIL/uL — ABNORMAL LOW (ref 4.22–5.81)
RBC: 3.79 MIL/uL — ABNORMAL LOW (ref 4.22–5.81)
RDW: 13.3 % (ref 11.5–15.5)
RDW: 13.5 % (ref 11.5–15.5)
WBC: 10.7 10*3/uL — ABNORMAL HIGH (ref 4.0–10.5)
WBC: 7.4 10*3/uL (ref 4.0–10.5)

## 2011-08-21 LAB — CULTURE, BLOOD (ROUTINE X 2): Culture: NO GROWTH

## 2011-08-21 LAB — COMPREHENSIVE METABOLIC PANEL
ALT: 17 U/L (ref 0–53)
AST: 19 U/L (ref 0–37)
CO2: 21 mEq/L (ref 19–32)
Calcium: 8.2 mg/dL — ABNORMAL LOW (ref 8.4–10.5)
Chloride: 105 mEq/L (ref 96–112)
GFR calc Af Amer: >60 mL/min (ref >60)
GFR calc non Af Amer: 50 mL/min — ABNORMAL LOW (ref >60)
Glucose, Bld: 147 mg/dL — ABNORMAL HIGH (ref 70–99)
Sodium: 134 mEq/L — ABNORMAL LOW (ref 135–145)
Total Bilirubin: 0.9 mg/dL (ref 0.3–1.2)

## 2011-08-21 LAB — GLUCOSE, CAPILLARY
Glucose-Capillary: 103 mg/dL — ABNORMAL HIGH (ref 70–99)
Glucose-Capillary: 164 mg/dL — ABNORMAL HIGH (ref 70–99)
Glucose-Capillary: 190 mg/dL — ABNORMAL HIGH (ref 70–99)
Glucose-Capillary: 234 mg/dL — ABNORMAL HIGH (ref 70–99)
Glucose-Capillary: 234 mg/dL — ABNORMAL HIGH (ref 70–99)
Glucose-Capillary: 92 mg/dL (ref 70–99)
Glucose-Capillary: 93 mg/dL (ref 70–99)

## 2011-08-21 LAB — DIFFERENTIAL
Basophils Relative: 0 % (ref 0–1)
Eosinophils Absolute: 0.1 10*3/uL (ref 0.0–0.7)
Eosinophils Relative: 1 % (ref 0–5)
Lymphocytes Relative: 7 % — ABNORMAL LOW (ref 12–46)
Lymphs Abs: 0.8 10*3/uL (ref 0.7–4.0)
Lymphs Abs: 1 10*3/uL (ref 0.7–4.0)
Monocytes Absolute: 0.8 10*3/uL (ref 0.1–1.0)
Monocytes Relative: 12 % (ref 3–12)
Monocytes Relative: 8 % (ref 3–12)
Neutro Abs: 9 10*3/uL — ABNORMAL HIGH (ref 1.7–7.7)
Neutrophils Relative %: 74 % (ref 43–77)
Neutrophils Relative %: 84 % — ABNORMAL HIGH (ref 43–77)

## 2011-08-21 LAB — POCT I-STAT, CHEM 8
BUN: 24 mg/dL — ABNORMAL HIGH (ref 6–23)
Chloride: 107 mEq/L (ref 96–112)
HCT: 37 % — ABNORMAL LOW (ref 39.0–52.0)
Potassium: 3.6 mEq/L (ref 3.5–5.1)
Sodium: 141 mEq/L (ref 135–145)

## 2011-08-21 LAB — POCT CARDIAC MARKERS
Myoglobin, poc: 169 ng/mL (ref 12–200)
Troponin i, poc: <0.05 ng/mL (ref 0.00–0.09)

## 2011-08-21 LAB — BASIC METABOLIC PANEL
BUN: 20 mg/dL (ref 6–23)
CO2: 22 mEq/L (ref 19–32)
CO2: 23 mEq/L (ref 19–32)
CO2: 23 mEq/L (ref 19–32)
Chloride: 105 mEq/L (ref 96–112)
Chloride: 106 mEq/L (ref 96–112)
Chloride: 107 mEq/L (ref 96–112)
Creatinine, Ser: 1.7 mg/dL — ABNORMAL HIGH (ref 0.4–1.5)
Creatinine, Ser: 1.73 mg/dL — ABNORMAL HIGH (ref 0.4–1.5)
GFR calc Af Amer: 48 mL/min — ABNORMAL LOW (ref >60)
GFR calc Af Amer: 49 mL/min — ABNORMAL LOW (ref >60)
Glucose, Bld: 194 mg/dL — ABNORMAL HIGH (ref 70–99)
Potassium: 4 mEq/L (ref 3.5–5.1)
Sodium: 136 mEq/L (ref 135–145)

## 2011-08-21 LAB — URINALYSIS, ROUTINE W REFLEX MICROSCOPIC
Bilirubin Urine: NEGATIVE
Glucose, UA: NEGATIVE mg/dL
Hgb urine dipstick: NEGATIVE
Protein, ur: NEGATIVE mg/dL
Urobilinogen, UA: 1 mg/dL (ref 0.0–1.0)

## 2011-08-21 LAB — D-DIMER, QUANTITATIVE: D-Dimer, Quant: 1.23 ug/mL-FEU — ABNORMAL HIGH (ref 0.00–0.48)

## 2011-08-21 LAB — RENAL FUNCTION PANEL
CO2: 25 mEq/L (ref 19–32)
Calcium: 9.3 mg/dL (ref 8.4–10.5)
Chloride: 102 mEq/L (ref 96–112)
GFR calc Af Amer: 56 mL/min — ABNORMAL LOW (ref >60)
GFR calc non Af Amer: 47 mL/min — ABNORMAL LOW (ref >60)
Potassium: 4 mEq/L (ref 3.5–5.1)
Sodium: 137 mEq/L (ref 135–145)

## 2011-08-21 LAB — MAGNESIUM: Magnesium: 1.7 mg/dL (ref 1.5–2.5)

## 2011-08-21 LAB — B-NATRIURETIC PEPTIDE (CONVERTED LAB): Pro B Natriuretic peptide (BNP): 103 pg/mL — ABNORMAL HIGH (ref 0.0–100.0)

## 2011-10-13 ENCOUNTER — Encounter (HOSPITAL_COMMUNITY): Payer: Self-pay | Admitting: *Deleted

## 2011-10-13 ENCOUNTER — Emergency Department (HOSPITAL_COMMUNITY)
Admission: EM | Admit: 2011-10-13 | Discharge: 2011-10-13 | Disposition: A | Discharge status: Home / Self Care | Payer: Medicare HMO | Attending: Emergency Medicine | Admitting: Emergency Medicine

## 2011-10-13 ENCOUNTER — Emergency Department (INDEPENDENT_AMBULATORY_CARE_PROVIDER_SITE_OTHER)
Admission: EM | Admit: 2011-10-13 | Discharge: 2011-10-13 | Disposition: A | Discharge status: Nursing Home (Includes ICF) | Payer: Medicare Other | Source: Home / Self Care | Attending: Emergency Medicine | Admitting: Emergency Medicine

## 2011-10-13 ENCOUNTER — Telehealth (HOSPITAL_COMMUNITY): Payer: Self-pay

## 2011-10-13 DIAGNOSIS — M542 Cervicalgia: Secondary | ICD-10-CM | POA: Insufficient documentation

## 2011-10-13 DIAGNOSIS — E119 Type 2 diabetes mellitus without complications: Secondary | ICD-10-CM | POA: Insufficient documentation

## 2011-10-13 DIAGNOSIS — L0201 Cutaneous abscess of face: Secondary | ICD-10-CM | POA: Diagnosis present

## 2011-10-13 DIAGNOSIS — Z940 Kidney transplant status: Secondary | ICD-10-CM | POA: Insufficient documentation

## 2011-10-13 DIAGNOSIS — I10 Essential (primary) hypertension: Secondary | ICD-10-CM | POA: Insufficient documentation

## 2011-10-13 DIAGNOSIS — L0211 Cutaneous abscess of neck: Secondary | ICD-10-CM | POA: Insufficient documentation

## 2011-10-13 DIAGNOSIS — R220 Localized swelling, mass and lump, head: Secondary | ICD-10-CM | POA: Insufficient documentation

## 2011-10-13 DIAGNOSIS — Z79899 Other long term (current) drug therapy: Secondary | ICD-10-CM | POA: Insufficient documentation

## 2011-10-13 DIAGNOSIS — R221 Localized swelling, mass and lump, neck: Secondary | ICD-10-CM | POA: Insufficient documentation

## 2011-10-13 DIAGNOSIS — Z7982 Long term (current) use of aspirin: Secondary | ICD-10-CM | POA: Insufficient documentation

## 2011-10-13 DIAGNOSIS — L0291 Cutaneous abscess, unspecified: Secondary | ICD-10-CM

## 2011-10-13 DIAGNOSIS — L0390 Cellulitis, unspecified: Secondary | ICD-10-CM

## 2011-10-13 LAB — BASIC METABOLIC PANEL
CO2: 26 mEq/L (ref 19–32)
Calcium: 8.4 mg/dL (ref 8.4–10.5)
Glucose, Bld: 151 mg/dL — ABNORMAL HIGH (ref 70–99)
Potassium: 3.7 mEq/L (ref 3.5–5.1)
Sodium: 140 mEq/L (ref 135–145)

## 2011-10-13 LAB — DIFFERENTIAL
Eosinophils Relative: 3 % (ref 0–5)
Lymphocytes Relative: 16 % (ref 12–46)
Lymphs Abs: 1.1 10*3/uL (ref 0.7–4.0)

## 2011-10-13 LAB — CBC
MCV: 83.5 fL (ref 78.0–100.0)
Platelets: 208 10*3/uL (ref 150–400)
RBC: 4.17 MIL/uL — ABNORMAL LOW (ref 4.22–5.81)
WBC: 7.1 10*3/uL (ref 4.0–10.5)

## 2011-10-13 MED ORDER — HYDROCODONE-ACETAMINOPHEN 5-500 MG PO TABS: 2.0000 | ORAL_TABLET | Freq: Four times a day (QID) | ORAL | Status: AC | PRN

## 2011-10-13 MED ORDER — DOXYCYCLINE HYCLATE 100 MG PO CAPS: 100.0000 mg | ORAL_CAPSULE | Freq: Two times a day (BID) | ORAL | Status: AC

## 2011-10-13 MED ORDER — HYDROMORPHONE HCL PF 1 MG/ML IJ SOLN
1.0000 mg | Freq: Once | INTRAMUSCULAR | Status: AC
  Administered 2011-10-13: 1 mg via INTRAVENOUS
  Filled 2011-10-13: qty 1

## 2011-10-13 MED ORDER — HYDROMORPHONE HCL PF 1 MG/ML IJ SOLN: 1.0000 mg | Freq: Once | INTRAMUSCULAR | Status: DC

## 2011-10-13 MED ORDER — LIDOCAINE-EPINEPHRINE 1 %-1:100000 IJ SOLN
10.0000 mL | Freq: Once | INTRAMUSCULAR | Status: AC
  Administered 2011-10-13: 10 mL
  Filled 2011-10-13: qty 1

## 2011-10-13 MED ORDER — VANCOMYCIN HCL IN DEXTROSE 1-5 GM/200ML-% IV SOLN
1000.0000 mg | Freq: Once | INTRAVENOUS | Status: AC
  Administered 2011-10-13: 1000 mg via INTRAVENOUS
  Filled 2011-10-13: qty 200

## 2011-10-13 NOTE — ED Provider Notes (Signed)
Pt seen by me in CDU. Pt with right neck abscess. Tried to drain in UC, unable. Will call general surgery.   Lottie Mussel, PA 10/13/11 1459  Spoke with Caryn Bee, General surgery. Asked to call ENT due to abscess location  Lottie Mussel, [place] 10/13/11 1623   4:46 PM Spoke with Dr. Annalee Genta, will come see pt in ED  Lottie Mussel, PA 10/13/11 1646

## 2011-10-13 NOTE — ED Notes (Addendum)
Family at bedside. 

## 2011-10-13 NOTE — ED Notes (Signed)
Patient is resting comfortably. 

## 2011-10-13 NOTE — ED Provider Notes (Signed)
History     CSN: [id number] Arrival date & time: 10/13/2011 10:48 AM   First MD Initiated Contact with Patient 10/13/11 1318      Chief Complaint  Patient presents with  . Recurrent Skin Infections    Patient is a 60 y.o. male presenting with abscess.  Abscess  This is a new problem. The current episode started less than one week ago. The onset was gradual. The problem occurs continuously. The problem has been gradually worsening. Affected Location: right neck. The problem is moderate. The abscess is characterized by painfulness. The abscess first occurred at home. Pertinent negatives include no fever, no vomiting and no sore throat.  pt reports abscess to right neck  He denies difficulty swallowing No dental pain reported  Past Medical History  Diagnosis Date  . Diabetes mellitus   . Hypertension     Past Surgical History  Procedure Date  . Kidney transplant     History reviewed. No pertinent family history.  History  Substance Use Topics  . Smoking status: Never Smoker   . Smokeless tobacco: Never Used  . Alcohol Use: No      Review of Systems  Constitutional: Negative for fever.  HENT: Negative for sore throat.   Gastrointestinal: Negative for vomiting.  All other systems reviewed and are negative.    Allergies  Iodine and Shellfish allergy  Home Medications   Current Outpatient Rx  Name Route Sig Dispense Refill  . AMLODIPINE BESYLATE 5 MG PO TABS Oral Take 5 mg by mouth daily.      . ASPIRIN 81 MG PO TABS Oral Take 81 mg by mouth daily.      . ATORVASTATIN CALCIUM 80 MG PO TABS Oral Take 80 mg by mouth daily.      Marland Kitchen FEXOFENADINE HCL 180 MG PO TABS Oral Take 180 mg by mouth daily.      Marland Kitchen FLUTICASONE PROPIONATE 50 MCG/ACT NA SUSP Nasal Place 1 spray into the nose daily.      . FUROSEMIDE 80 MG PO TABS Oral Take 80 mg by mouth daily.      Marland Kitchen GABAPENTIN 300 MG PO CAPS Oral Take 300 mg by mouth 2 (two) times daily.      . INSULIN GLARGINE 100 UNIT/ML [place]  SOLN Subcutaneous Inject 25 Units into the skin every morning.     . ISOSORBIDE DINITRATE 30 MG PO TABS Oral Take 30 mg by mouth 1 day or 1 dose.      Marland Kitchen LABETALOL HCL 300 MG PO TABS Oral Take 300 mg by mouth 2 (two) times daily.      Marland Kitchen LISINOPRIL 20 MG PO TABS Oral Take 20 mg by mouth daily.      Marland Kitchen METOCLOPRAMIDE HCL 10 MG PO TABS Oral Take 10 mg by mouth 2 (two) times daily.     Marland Kitchen MYCOPHENOLATE MOFETIL 250 MG PO CAPS Oral Take 250 mg by mouth 2 (two) times daily.      Marland Kitchen OMEPRAZOLE 20 MG PO CPDR Oral Take 20 mg by mouth daily.      Marland Kitchen VITAMIN D (ERGOCALCIFEROL) 50000 UNITS PO CAPS Oral Take 50,000 Units by mouth 2 (two) times a week.        BP 153/60  Pulse 64  Temp(Src) 97.9 F (36.6 C) (Oral)  Resp 20  SpO2 98%  Physical Exam CONSTITUTIONAL: Well developed/well nourished HEAD AND FACE: Normocephalic/atraumatic EYES: EOMI/PERRL ENMT: Mucous membranes moist.  No dental abscess/tenderness noted.  No trismus/malocclusion NECK: supple  no meningeal signs Large abscess noted to right mid neck, no crepitance.  No active bleeding.  +drainage. It is indurated.   Voice normal, no stridor CV: S1/S2 noted, no murmurs/rubs/gallops noted LUNGS: Lungs are clear to auscultation bilaterally, no apparent distress ABDOMEN: soft, nontender, no rebound or guarding GU:no cva tenderness NEURO: Pt is awake/alert, moves all extremitiesx4 EXTREMITIES: pulses normal, full ROM SKIN: warm, color normal PSYCH: no abnormalities of mood noted    ED Course  Procedures   Labs Reviewed  CBC - Abnormal; Notable for the following:    RBC 4.17 (*)    Hemoglobin 11.0 (*)    HCT 34.8 (*)    All other components within normal limits  DIFFERENTIAL - Abnormal; Notable for the following:    Monocytes Relative 16 (*)    Monocytes Absolute 1.1 (*)    All other components within normal limits  BASIC METABOLIC PANEL - Abnormal; Notable for the following:    Glucose, Bld 151 (*)    BUN 39 (*)    Creatinine, Ser  1.49 (*)    GFR calc non Af Amer 49 (*)    GFR calc Af Amer 57 (*)    All other components within normal limits   2:32 PM Move to CDU, start abx, and will consult surgery Care shared with PA Kirichenko  MDM  Nursing notes reviewed and considered in documentation All labs/vitals reviewed and considered         Joya Gaskins, MD 10/14/11 1349

## 2011-10-13 NOTE — ED Provider Notes (Signed)
Patient seen by Dr. Annalee Genta, with I&D performed.  Dr. Annalee Genta will see in office in three days to remove drain.  Requests doxycycline 100 mg bid x 10 days and lortab 5/500 (#20).  Patient should also follow-up with his PCP and renal physician.  Jimmye Norman, NP 10/13/11 1901

## 2011-10-13 NOTE — ED Provider Notes (Deleted)
Pt with large abscess to right neck Awake/alert, stable at this time Labs/pain meds ordered  Joya Gaskins, MD 10/13/11 1328  Joya Gaskins, MD 10/13/11 1428

## 2011-10-13 NOTE — ED Notes (Signed)
Family at bedside. 

## 2011-10-13 NOTE — ED Notes (Signed)
MD at bedside. 

## 2011-10-13 NOTE — ED Notes (Signed)
For past week pt. has noticed boils in different areas of body.  Has large boil on right lower jaw, top of head, several at groin area, 2 on back.  Areas start out as small pimples then become large boils.  Pt. States the only thing he has changed is one medication..cyclosporin.  He normally takes 150mg  bid..was out of the 100mg  pills.  Pt. Started to take 50mg  bid.

## 2011-10-13 NOTE — ED Notes (Signed)
Vital signs stable. 

## 2011-10-13 NOTE — ED Notes (Signed)
Sent here from ucc for multiple abscess, they attempted to I&D facial abscess with no success, may need surgical consult. No acute distress noted at triage.

## 2011-10-13 NOTE — ED Notes (Signed)
Pt. Transferred to ED via shuttle.  Gave report to [place], RN triage nurse.  Covered left lower jaw boil site with 4x4.

## 2011-10-13 NOTE — Procedures (Signed)
Incision and Drainage Procedure Note  Pre-operative Diagnosis: Facial Abscess  Post-operative Diagnosis: same  Indications: Above  Anesthesia: 1% lidocaine with epinephrine  Procedure Details  The procedure, risks and complications have been discussed in detail (including, but not limited to airway compromise, infection, bleeding) with the patient, and the patient has signed consent to the procedure.  The skin was sterilely prepped and draped over the affected area in the usual fashion. After adequate local anesthesia, I&D with a #11 blade was performed on the right Facial abscess. Purulent drainage: present The patient was observed until stable.  Findings: Min purulent d/c, C/S obtained  EBL: 0 cc's  Drains: 1/4 inch penrose  Condition: Tolerated procedure well   Complications: none.

## 2011-10-13 NOTE — ED Notes (Signed)
Patient denies pain and is resting comfortably.  

## 2011-10-13 NOTE — ED Provider Notes (Signed)
Medical screening examination/treatment/procedure(s) were conducted as a shared visit with non-physician practitioner(s) and myself.  I personally evaluated the patient during the encounter   Joya Gaskins, MD 10/13/11 2015

## 2011-10-13 NOTE — ED Provider Notes (Addendum)
History     CSN: [id number] Arrival date & time: 10/13/2011  8:38 AM   First MD Initiated Contact with Patient 10/13/11 [phone]      Chief Complaint  Patient presents with  . Recurrent Skin Infections    For past week pt. has noticed boils in different areas of body.  Has large boil on right lower jaw, top of head, several at groin area, 2 on back.  Areas start out as small pimples then become large boils.      (Consider location/radiation/quality/duration/timing/severity/associated sxs/prior treatment) HPI Comments: For 2 weeks been having this "boil" on his face its just getting bigger and bigger, he has other little boils on his scalp lower back and legs, for several days" the one on his face its growing larger".  The history is provided by the patient and the spouse.    Past Medical History  Diagnosis Date  . Diabetes mellitus   . Hypertension     Past Surgical History  Procedure Date  . Kidney transplant     History reviewed. No pertinent family history.  History  Substance Use Topics  . Smoking status: Never Smoker   . Smokeless tobacco: Never Used  . Alcohol Use: No      Review of Systems  Constitutional: Negative for fever and fatigue.  Skin: Positive for rash and wound.    Allergies  Iodine and Shellfish allergy  Home Medications   Current Outpatient Rx  Name Route Sig Dispense Refill  . AMLODIPINE BESYLATE 5 MG PO TABS Oral Take 5 mg by mouth daily.      . ASPIRIN 81 MG PO TABS Oral Take 81 mg by mouth daily.      . ATORVASTATIN CALCIUM 80 MG PO TABS Oral Take 80 mg by mouth daily.      Marland Kitchen FEXOFENADINE HCL 180 MG PO TABS Oral Take 180 mg by mouth daily.      Marland Kitchen FLUTICASONE PROPIONATE 50 MCG/ACT NA SUSP Nasal Place 1 spray into the nose daily.      . FUROSEMIDE 80 MG PO TABS Oral Take 80 mg by mouth daily.      Marland Kitchen GABAPENTIN 300 MG PO CAPS Oral Take 300 mg by mouth 2 (two) times daily.      . INSULIN GLARGINE 100 UNIT/ML [place] SOLN Subcutaneous Inject 15  Units into the skin at bedtime.      . ISOSORBIDE DINITRATE 30 MG PO TABS Oral Take 30 mg by mouth 1 day or 1 dose.      Marland Kitchen LABETALOL HCL 300 MG PO TABS Oral Take 300 mg by mouth 2 (two) times daily.      Marland Kitchen LISINOPRIL 20 MG PO TABS Oral Take 20 mg by mouth daily.      Marland Kitchen METOCLOPRAMIDE HCL 10 MG PO TABS Oral Take 10 mg by mouth 4 (four) times daily.      Marland Kitchen MYCOPHENOLATE MOFETIL 250 MG PO CAPS Oral Take 250 mg by mouth 2 (two) times daily.      Marland Kitchen OMEPRAZOLE 20 MG PO CPDR Oral Take 20 mg by mouth daily.      Marland Kitchen VITAMIN D (ERGOCALCIFEROL) 50000 UNITS PO CAPS Oral Take 50,000 Units by mouth 2 (two) times a week.        BP 145/62  Pulse 59  Temp(Src) 98 F (36.7 C) (Oral)  Resp 16  SpO2 100%  Physical Exam  Nursing note and vitals reviewed. Constitutional: He appears well-developed and well-nourished.  Neck: Normal  range of motion.  Skin: Skin is warm. There is erythema.       ED Course  Procedures (including critical care time)  Labs Reviewed - No data to display No results found.   No diagnosis found.    MDM  Multiple furuncles one facial abscess R submandibular.        Jimmie Molly, MD 10/13/11 1336  Jimmie Molly, MD 10/13/11 [phone]

## 2011-10-13 NOTE — Consult Note (Signed)
Reason for Consult:Rt Facial swelling Referring Physician: ER physician  [name] is an 60 y.o. male.  HPI: Several wk hx of increased swelling and pain rt perimandibular region. No fever or recent infection. Pt reports squeezing the area last wk. Several other areas of swelling: Scalp and groin. 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  Past Medical History  Diagnosis Date  . Diabetes mellitus   . Hypertension     Past Surgical History  Procedure Date  . Kidney transplant     History reviewed. No pertinent family history.  Social History:  reports that he has never smoked. He has never used smokeless tobacco. He reports that he does not drink alcohol or use illicit drugs.  Allergies:  Allergies  Allergen Reactions  . Iodine     Iv dye  . Shellfish Allergy Swelling    Has throat swelling, tongue swelling, and entire body swells up.     Medications: I have reviewed the patient's current medications.  Results for orders placed during the hospital encounter of 10/13/11 (from the past 48 hour(s))  CBC     Status: Abnormal   Collection Time   10/13/11  1:35 PM      Component Value Range Comment   WBC 7.1  4.0 - 10.5 (K/uL)    RBC 4.17 (*) 4.22 - 5.81 (MIL/uL)    Hemoglobin 11.0 (*) 13.0 - 17.0 (g/dL)    HCT 09.8 (*) 11.9 - 52.0 (%)    MCV 83.5  78.0 - 100.0 (fL)    MCH 26.4  26.0 - 34.0 (pg)    MCHC 31.6  30.0 - 36.0 (g/dL)    RDW 14.7  82.9 - 56.2 (%)    Platelets 208  150 - 400 (K/uL)   DIFFERENTIAL     Status: Abnormal   Collection Time   10/13/11  1:35 PM      Component Value Range Comment   Neutrophils Relative 66  43 - 77 (%)    Neutro Abs 4.7  1.7 - 7.7 (K/uL)    Lymphocytes Relative 16  12 - 46 (%)    Lymphs Abs 1.1  0.7 - 4.0 (K/uL)    Monocytes Relative 16 (*) 3 - 12 (%)    Monocytes Absolute 1.1 (*) 0.1 - 1.0 (K/uL)    Eosinophils Relative 3  0 - 5 (%)    Eosinophils Absolute 0.2  0.0 - 0.7 (K/uL)    Basophils Relative 0  0 - 1 (%)    Basophils Absolute 0.0  0.0 - 0.1 (K/uL)   BASIC METABOLIC PANEL     Status: Abnormal   Collection Time   10/13/11  1:35 PM      Component Value Range Comment   Sodium 140  135 - 145 (mEq/L)    Potassium 3.7  3.5 - 5.1 (mEq/L)    Chloride 105  96 - 112 (mEq/L)    CO2 26  19 - 32 (mEq/L)    Glucose, Bld 151 (*) 70 - 99 (mg/dL)    BUN 39 (*) 6 - 23 (mg/dL)    Creatinine, Ser 1.30 (*) 0.50 - 1.35 (mg/dL)    Calcium 8.4  8.4 - 10.5 (mg/dL)    GFR calc non Af Amer 49 (*) >90 (mL/min)    GFR calc Af Amer 57 (*) >90 (mL/min)     No results found.  Review of Systems  Constitutional: Negative.   HENT: Negative.   Respiratory: Negative.   Cardiovascular: Negative.  Skin:       Multiple skin swelling and possible abscess.   Blood pressure 183/74, pulse 61, temperature 97.9 F (36.6 C), temperature source Oral, resp. rate 20, SpO2 100.00%. Physical Exam  Constitutional: He is oriented to person, place, and time. He appears well-developed and well-nourished.  HENT:  Head: Normocephalic and atraumatic.  Eyes: Pupils are equal, round, and reactive to light.  Neck:       Rt peri-mandibular swelling c/w abscess.  Cardiovascular: Normal rate and regular rhythm.   Respiratory: Effort normal and breath sounds normal.  Musculoskeletal: Normal range of motion.  Lymphadenopathy:    He has no cervical adenopathy.  Neurological: He is alert and oriented to person, place, and time.  Skin: Skin is warm and dry.    Assessment/Plan: I & D performed, drain placed. C/S sent. Wound care and dressing changes. Plan F/U on Thursday for drain removal. PO ABX: Doxycycline 100mg  bid for 10 days.  Veona Bittman L 10/13/2011, 6:36 PM

## 2011-10-13 NOTE — ED Provider Notes (Signed)
Medical screening examination/treatment/procedure(s) were conducted as a shared visit with non-physician practitioner(s) and myself.  I personally evaluated the patient during the encounter   Laquinn Shippy W Cayci Mcnabb, MD 10/13/11 2015 

## 2011-10-14 ENCOUNTER — Emergency Department (HOSPITAL_COMMUNITY): Payer: Medicare HMO

## 2011-10-14 ENCOUNTER — Emergency Department (HOSPITAL_COMMUNITY)
Admission: EM | Admit: 2011-10-14 | Discharge: 2011-10-14 | Disposition: A | Discharge status: Home / Self Care | Payer: Medicare HMO | Attending: Emergency Medicine | Admitting: Emergency Medicine

## 2011-10-14 ENCOUNTER — Encounter (HOSPITAL_COMMUNITY): Payer: Self-pay | Admitting: *Deleted

## 2011-10-14 DIAGNOSIS — L0201 Cutaneous abscess of face: Secondary | ICD-10-CM | POA: Insufficient documentation

## 2011-10-14 DIAGNOSIS — K920 Hematemesis: Secondary | ICD-10-CM

## 2011-10-14 DIAGNOSIS — Z79899 Other long term (current) drug therapy: Secondary | ICD-10-CM | POA: Insufficient documentation

## 2011-10-14 DIAGNOSIS — I10 Essential (primary) hypertension: Secondary | ICD-10-CM | POA: Insufficient documentation

## 2011-10-14 DIAGNOSIS — E119 Type 2 diabetes mellitus without complications: Secondary | ICD-10-CM | POA: Insufficient documentation

## 2011-10-14 DIAGNOSIS — L03211 Cellulitis of face: Secondary | ICD-10-CM | POA: Insufficient documentation

## 2011-10-14 DIAGNOSIS — Z794 Long term (current) use of insulin: Secondary | ICD-10-CM | POA: Insufficient documentation

## 2011-10-14 DIAGNOSIS — R42 Dizziness and giddiness: Secondary | ICD-10-CM | POA: Insufficient documentation

## 2011-10-14 LAB — BASIC METABOLIC PANEL
BUN: 28 mg/dL — ABNORMAL HIGH (ref 6–23)
Chloride: 105 mEq/L (ref 96–112)
GFR calc Af Amer: 63 mL/min — ABNORMAL LOW (ref >90)
GFR calc non Af Amer: 54 mL/min — ABNORMAL LOW (ref >90)
Glucose, Bld: 49 mg/dL — ABNORMAL LOW (ref 70–99)
Potassium: 4.1 mEq/L (ref 3.5–5.1)
Sodium: 139 mEq/L (ref 135–145)

## 2011-10-14 LAB — CBC
Hemoglobin: 11.1 g/dL — ABNORMAL LOW (ref 13.0–17.0)
Platelets: 227 10*3/uL (ref 150–400)
RBC: 4.21 MIL/uL — ABNORMAL LOW (ref 4.22–5.81)

## 2011-10-14 LAB — DIFFERENTIAL
Basophils Relative: 0 % (ref 0–1)
Eosinophils Absolute: 0.2 10*3/uL (ref 0.0–0.7)
Lymphs Abs: 1.1 10*3/uL (ref 0.7–4.0)
Monocytes Relative: 8 % (ref 3–12)
Neutro Abs: 6.9 10*3/uL (ref 1.7–7.7)
Neutrophils Relative %: 77 % (ref 43–77)

## 2011-10-14 LAB — ETHANOL: Alcohol, Ethyl (B): <11 mg/dL (ref 0–11)

## 2011-10-14 LAB — HEPATIC FUNCTION PANEL
Albumin: 3.6 g/dL (ref 3.5–5.2)
Alkaline Phosphatase: 63 U/L (ref 39–117)
Total Protein: 7.1 g/dL (ref 6.0–8.3)

## 2011-10-14 NOTE — ED Notes (Signed)
Pt presents to department for evaluation of vomiting blood. Onset this morning. Pt states he noticed bright red blood in emesis. No abdominal pain. No fever. Pt alert and oriented x4. Was seen here last night for abscess to R side of face, no complications with this at the time. Vital signs stable. Family at the bedside.

## 2011-10-14 NOTE — ED Provider Notes (Signed)
History     CSN: [id number] Arrival date & time: 10/14/2011  1:17 PM   First MD Initiated Contact with Patient 10/14/11 1731      Chief Complaint  Patient presents with  . Hematemesis    (Consider location/radiation/quality/duration/timing/severity/associated sxs/prior treatment) HPI The patient is a 60 year old male who presents today complaining of having 3-4 episodes of emesis this morning. He noted streaks of blood in this. Patient felt fine prior to eating breakfast. He had some oatmeal and then vomited. He said what came up. Bluish and also had streaks of blood in it. He has history of reflux but has never had this before. Patient has past medical history significant for kidney transplant 8 years ago. He actually was just seen in this emergency department yesterday for a facial abscess. Patient received IV antibiotics yesterday. He has not taken any oral antibiotics today. He is no longer nauseous. He endorses lightheadedness but denies chest pain or shortness of breath. The patient has no abdominal pain either. His symptoms have completely resolved at this time. He has no pain. There are no other associated or modifying factors. Past Medical History  Diagnosis Date  . Diabetes mellitus   . Hypertension     Past Surgical History  Procedure Date  . Kidney transplant     No family history on file.  History  Substance Use Topics  . Smoking status: Never Smoker   . Smokeless tobacco: Never Used  . Alcohol Use: No      Review of Systems  Constitutional: Negative.   HENT: Negative.   Eyes: Negative.   Respiratory: Negative.   Cardiovascular: Negative.   Gastrointestinal: Positive for vomiting.       Streaks of blood in emesis  Genitourinary: Negative.   Musculoskeletal: Negative.   Skin: Negative.   Neurological: Positive for light-headedness.  Hematological: Negative.   Psychiatric/Behavioral: Negative.   All other systems reviewed and are  negative.    Allergies  Iodine and Shellfish allergy  Home Medications   Current Outpatient Rx  Name Route Sig Dispense Refill  . AMLODIPINE BESYLATE 5 MG PO TABS Oral Take 5 mg by mouth daily.      . ASPIRIN 81 MG PO TABS Oral Take 81 mg by mouth daily.      . ATORVASTATIN CALCIUM 80 MG PO TABS Oral Take 80 mg by mouth daily.      Marland Kitchen DOXYCYCLINE HYCLATE 100 MG PO CAPS Oral Take 1 capsule (100 mg total) by mouth 2 (two) times daily. 20 capsule 0  . FEXOFENADINE HCL 180 MG PO TABS Oral Take 180 mg by mouth daily.      Marland Kitchen FLUTICASONE PROPIONATE 50 MCG/ACT NA SUSP Nasal Place 1 spray into the nose daily.      . FUROSEMIDE 80 MG PO TABS Oral Take 80 mg by mouth daily.      Marland Kitchen GABAPENTIN 300 MG PO CAPS Oral Take 300 mg by mouth 2 (two) times daily.      Marland Kitchen HYDROCODONE-ACETAMINOPHEN 5-500 MG PO TABS Oral Take 2 tablets by mouth every 6 (six) hours as needed for pain.  10 tablet 0  . INSULIN GLARGINE 100 UNIT/ML [place] SOLN Subcutaneous Inject 25 Units into the skin every morning.     . ISOSORBIDE DINITRATE 30 MG PO TABS Oral Take 30 mg by mouth 1 day or 1 dose.      Marland Kitchen LABETALOL HCL 300 MG PO TABS Oral Take 300 mg by mouth 2 (two) times daily.      Marland Kitchen  LISINOPRIL 20 MG PO TABS Oral Take 20 mg by mouth daily.      Marland Kitchen METOCLOPRAMIDE HCL 10 MG PO TABS Oral Take 10 mg by mouth 2 (two) times daily.     Marland Kitchen MYCOPHENOLATE MOFETIL 250 MG PO CAPS Oral Take 250 mg by mouth 2 (two) times daily.      Marland Kitchen OMEPRAZOLE 20 MG PO CPDR Oral Take 20 mg by mouth daily.      Marland Kitchen VITAMIN D (ERGOCALCIFEROL) 50000 UNITS PO CAPS Oral Take 50,000 Units by mouth 2 (two) times a week.        BP 140/73  Pulse 59  Temp(Src) 98.4 F (36.9 C) (Oral)  Resp 18  SpO2 98%  Physical Exam  Nursing note and vitals reviewed. Constitutional: He is oriented to person, place, and time. He appears well-developed and well-nourished.  HENT:  Head: Normocephalic.  Mouth/Throat: Oropharynx is clear and moist.       Patient with right-sided  facial abscess status post I&D.  Eyes: Conjunctivae and EOM are normal. Pupils are equal, round, and reactive to light.  Neck: Normal range of motion.  Cardiovascular: Normal rate, regular rhythm, normal heart sounds and intact distal pulses.  Exam reveals no gallop and no friction rub.   No murmur heard. Pulmonary/Chest: Effort normal and breath sounds normal. No respiratory distress. He has no wheezes. He has no rales.  Abdominal: Soft. Bowel sounds are normal. He exhibits no distension. There is no tenderness. There is no rebound and no guarding.  Musculoskeletal: Normal range of motion.  Neurological: He is alert and oriented to person, place, and time. No cranial nerve deficit. He exhibits normal muscle tone. Coordination normal.  Skin: Skin is warm and dry. No rash noted.  Psychiatric: He has a normal mood and affect.    ED Course  Procedures (including critical care time)  Labs Reviewed  CBC - Abnormal; Notable for the following:    RBC 4.21 (*)    Hemoglobin 11.1 (*)    HCT 35.3 (*)    All other components within normal limits  BASIC METABOLIC PANEL - Abnormal; Notable for the following:    Glucose, Bld 49 (*)    BUN 28 (*)    Creatinine, Ser 1.37 (*)    GFR calc non Af Amer 54 (*)    GFR calc Af Amer 63 (*)    All other components within normal limits  DIFFERENTIAL  LIPASE, BLOOD  ETHANOL  HEPATIC FUNCTION PANEL   Dg Chest 2 View  10/14/2011  *RADIOLOGY REPORT*  Clinical Data: Hematemesis.  CHEST - 2 VIEW  Comparison: Chest x-ray 02/09/2009.  Findings: The cardiac silhouette, mediastinal and hilar contours are stable.  Mild chronic bronchitic type lung changes but no infiltrates, edema or effusions.  The bony thorax is intact.  IMPRESSION: Mild chronic bronchitic type lung changes but no acute pulmonary findings.  Original Report Authenticated By: P. Loralie Champagne, M.D.     1. Hematemesis       MDM  Patient was evaluated by myself. Patient did have a BMP and  CBC as well as a lipase and alcohol level ordered at triage. CBC showed a hemoglobin of 11.1. This is unchanged from yesterday. This also was not significantly different from last recorded value and 2010. Patient had elevated creatinine but this is in line with recent previous values. Patient had a normal lipase and no evidence of alcohol in his system. This was ordered prior to my evaluation.  7:00 PM  Patient's workup returned negative. Patient was completely asymptomatic here. Patient was comfortable with plan for discharge home. He did not have any signs of free air or other concerning symptoms on his chest x-ray. I did not think he had an upper GI bleed based on his symptoms. Additionally patient did not have symptoms consistent with Boerhaave's.  He is planning on following up with his primary care provider regarding the area on his face as well as today's symptoms. He knows that he is welcome to return if his symptoms return. He was discharged home in good condition. Patient was neither lightheaded nor having abdominal pain, nausea, or vomiting at the time of discharge   Cyndra Numbers, MD 10/14/11 1901

## 2011-10-14 NOTE — ED Notes (Signed)
Pt resting quietly at the time. Vital signs stable. Family at bedside. No signs of distress at the present.

## 2011-10-14 NOTE — ED Notes (Signed)
Pt is here with vomiting blood that started at 1130 today.Marland Kitchen  No pain.  Pt takes aspirin.

## 2011-10-16 LAB — CULTURE, ROUTINE-ABSCESS

## 2011-10-16 NOTE — ED Notes (Signed)
Abscess culture and medications reviewed.  Pt. adequately treated with Doxycycline for Staph. Aureus. Vassie Moselle 10/16/2011

## 2012-08-02 ENCOUNTER — Encounter (HOSPITAL_COMMUNITY): Payer: Self-pay

## 2012-08-02 ENCOUNTER — Emergency Department (HOSPITAL_COMMUNITY): Payer: Medicare HMO

## 2012-08-02 ENCOUNTER — Emergency Department (HOSPITAL_COMMUNITY)
Admission: EM | Admit: 2012-08-02 | Discharge: 2012-08-03 | Disposition: A | Discharge status: Home / Self Care | Payer: Medicare HMO | Attending: Emergency Medicine | Admitting: Emergency Medicine

## 2012-08-02 DIAGNOSIS — Z794 Long term (current) use of insulin: Secondary | ICD-10-CM | POA: Insufficient documentation

## 2012-08-02 DIAGNOSIS — I10 Essential (primary) hypertension: Secondary | ICD-10-CM | POA: Insufficient documentation

## 2012-08-02 DIAGNOSIS — J189 Pneumonia, unspecified organism: Secondary | ICD-10-CM | POA: Insufficient documentation

## 2012-08-02 DIAGNOSIS — Z79899 Other long term (current) drug therapy: Secondary | ICD-10-CM | POA: Insufficient documentation

## 2012-08-02 DIAGNOSIS — J40 Bronchitis, not specified as acute or chronic: Secondary | ICD-10-CM | POA: Insufficient documentation

## 2012-08-02 DIAGNOSIS — E860 Dehydration: Secondary | ICD-10-CM | POA: Insufficient documentation

## 2012-08-02 DIAGNOSIS — E119 Type 2 diabetes mellitus without complications: Secondary | ICD-10-CM | POA: Insufficient documentation

## 2012-08-02 LAB — CBC WITH DIFFERENTIAL/PLATELET
Basophils Absolute: 0 10*3/uL (ref 0.0–0.1)
Eosinophils Relative: 3 % (ref 0–5)
HCT: 32.3 % — ABNORMAL LOW (ref 39.0–52.0)
Hemoglobin: 10.4 g/dL — ABNORMAL LOW (ref 13.0–17.0)
Lymphocytes Relative: 18 % (ref 12–46)
Lymphs Abs: 1 10*3/uL (ref 0.7–4.0)
MCV: 83.9 fL (ref 78.0–100.0)
Monocytes Absolute: 0.7 10*3/uL (ref 0.1–1.0)
Monocytes Relative: 13 % — ABNORMAL HIGH (ref 3–12)
Neutro Abs: 3.6 10*3/uL (ref 1.7–7.7)
RBC: 3.85 MIL/uL — ABNORMAL LOW (ref 4.22–5.81)
WBC: 5.4 10*3/uL (ref 4.0–10.5)

## 2012-08-02 LAB — COMPREHENSIVE METABOLIC PANEL
AST: 13 U/L (ref 0–37)
BUN: 55 mg/dL — ABNORMAL HIGH (ref 6–23)
CO2: 23 mEq/L (ref 19–32)
Calcium: 8.9 mg/dL (ref 8.4–10.5)
Chloride: 103 mEq/L (ref 96–112)
Creatinine, Ser: 2.27 mg/dL — ABNORMAL HIGH (ref 0.50–1.35)
GFR calc Af Amer: 34 mL/min — ABNORMAL LOW (ref >90)
GFR calc non Af Amer: 29 mL/min — ABNORMAL LOW (ref >90)
Glucose, Bld: 114 mg/dL — ABNORMAL HIGH (ref 70–99)
Total Bilirubin: 0.3 mg/dL (ref 0.3–1.2)

## 2012-08-02 MED ORDER — SODIUM CHLORIDE 0.9 % IV BOLUS (SEPSIS)
1000.0000 mL | Freq: Once | INTRAVENOUS | Status: AC
  Administered 2012-08-02: 1000 mL via INTRAVENOUS

## 2012-08-02 MED ORDER — MOXIFLOXACIN HCL IN NACL 400 MG/250ML IV SOLN
400.0000 mg | Freq: Once | INTRAVENOUS | Status: AC
  Administered 2012-08-02: 400 mg via INTRAVENOUS
  Filled 2012-08-02: qty 250

## 2012-08-02 MED ORDER — ALBUTEROL SULFATE (5 MG/ML) 0.5% IN NEBU
5.0000 mg | INHALATION_SOLUTION | Freq: Once | RESPIRATORY_TRACT | Status: AC
  Administered 2012-08-02: 5 mg via RESPIRATORY_TRACT
  Filled 2012-08-02: qty 1

## 2012-08-02 MED ORDER — IPRATROPIUM BROMIDE 0.02 % IN SOLN
0.5000 mg | Freq: Once | RESPIRATORY_TRACT | Status: AC
  Administered 2012-08-02: 0.5 mg via RESPIRATORY_TRACT
  Filled 2012-08-02: qty 2.5

## 2012-08-02 NOTE — ED Notes (Signed)
Patient states that for the past 3 days he has been having a cough, + green prod cough.  Lungs with decreased breath sounds and rhonchi throughout

## 2012-08-02 NOTE — ED Notes (Signed)
Pt reports chest congestion, productive cough w/light green congestion, nasal congestion, and a sinus headache x3 days

## 2012-08-02 NOTE — ED Provider Notes (Signed)
History  This chart was scribed for Timothy Canal, MD by Bennett Scrape. This patient was seen in room TR04C/TR04C and the patient's care was started at 2128.  CSN: [id number]  Arrival date & time 08/02/12  1954   First MD Initiated Contact with Patient 08/02/12 2128      Chief Complaint  Patient presents with  . Cough    The history is provided by the patient. No language interpreter was used.    [name] is a 61 y.o. male who presents to the Emergency Department complaining of 3 days of gradual onset, gradually worsening, constant cough productive with light green congestion with associated nasal congestion, chest congestion and sinus HA. He also reports one episode of dizziness and auras in his vision today. He reports one episode of similar dizziness and auras several years ago when he was first diagnosed with renal failure. He states that he has not experienced any further episodes since his kidney transplant. He reports that he has not been eating and drinking well. He denies chills, fever, nausea, emesis and rash as associated symptoms. He has a h/o DM and HTN. He denies smoking and alcohol use.    Past Medical History  Diagnosis Date  . Diabetes mellitus   . Hypertension     Past Surgical History  Procedure Date  . Kidney transplant     History reviewed. No pertinent family history.  History  Substance Use Topics  . Smoking status: Never Smoker   . Smokeless tobacco: Never Used  . Alcohol Use: No      Review of Systems  Constitutional: Negative for fever and chills.  HENT: Positive for congestion. Negative for sore throat.   Respiratory: Positive for cough. Negative for shortness of breath.   Gastrointestinal: Negative for nausea and vomiting.  Skin: Negative for rash.  Neurological: Positive for headaches. Negative for syncope.    Allergies  Iodine and Shellfish allergy  Home Medications   Current Outpatient Rx  Name Route Sig Dispense Refill   . AMLODIPINE BESYLATE 5 MG PO TABS Oral Take 5 mg by mouth daily.      . ASPIRIN 81 MG PO TABS Oral Take 81 mg by mouth daily.      . ATORVASTATIN CALCIUM 80 MG PO TABS Oral Take 80 mg by mouth daily.      Marland Kitchen FEXOFENADINE HCL 180 MG PO TABS Oral Take 180 mg by mouth daily.      Marland Kitchen FLUTICASONE PROPIONATE 50 MCG/ACT NA SUSP Nasal Place 1 spray into the nose daily.      . FUROSEMIDE 80 MG PO TABS Oral Take 80 mg by mouth daily.      Marland Kitchen GABAPENTIN 300 MG PO CAPS Oral Take 300 mg by mouth 2 (two) times daily.      . INSULIN GLARGINE 100 UNIT/ML [place] SOLN Subcutaneous Inject into the skin 2 (two) times daily. Sliding scale    . ISOSORBIDE DINITRATE 30 MG PO TABS Oral Take 30 mg by mouth daily.     Marland Kitchen LABETALOL HCL 300 MG PO TABS Oral Take 300 mg by mouth 2 (two) times daily.      Marland Kitchen LISINOPRIL 20 MG PO TABS Oral Take 20 mg by mouth daily.      Marland Kitchen METOCLOPRAMIDE HCL 10 MG PO TABS Oral Take 10 mg by mouth 4 (four) times daily.     Marland Kitchen MYCOPHENOLATE MOFETIL 250 MG PO CAPS Oral Take 250 mg by mouth 2 (two) times daily.      Marland Kitchen  OMEPRAZOLE 20 MG PO CPDR Oral Take 20 mg by mouth daily.      Marland Kitchen VITAMIN D (ERGOCALCIFEROL) 50000 UNITS PO CAPS Oral Take 50,000 Units by mouth 2 (two) times a week.      . ALBUTEROL SULFATE HFA 108 (90 BASE) MCG/ACT IN AERS Inhalation Inhale 2 puffs into the lungs every 4 (four) hours as needed for wheezing. 1 Inhaler 0  . MOXIFLOXACIN HCL 400 MG PO TABS Oral Take 1 tablet (400 mg total) by mouth daily. 7 tablet 0    Triage Vitals: BP 131/51  Pulse 61  Temp 97.8 F (36.6 C) (Oral)  Resp 20  SpO2 97%  Physical Exam  Nursing note and vitals reviewed. Constitutional: He is oriented to person, place, and time. He appears well-developed and well-nourished. No distress.  HENT:  Head: Normocephalic and atraumatic.       OP slightly dry  Eyes: Conjunctivae normal and EOM are normal. Pupils are equal, round, and reactive to light. No scleral icterus.  Neck: Neck supple. No tracheal  deviation present.  Cardiovascular: Normal rate and regular rhythm.   Pulmonary/Chest: Effort normal. No respiratory distress. He has wheezes (diffuse wheezing). He has no rales.  Abdominal: He exhibits no distension.  Musculoskeletal: Normal range of motion.       No pedal edema  Neurological: He is alert and oriented to person, place, and time.  Skin: Skin is warm and dry.  Psychiatric: He has a normal mood and affect. His behavior is normal.    ED Course  Procedures (including critical care time)  DIAGNOSTIC STUDIES: Oxygen Saturation is 97% on room air, normal by my interpretation.    COORDINATION OF CARE: 2142-Informed pt that the radiology report shows possible PNA. Discussed treatment plan which includes IV fluids, antibiotics and breathing treatment with pt at bedside and pt agreed to plan.  2328-Pt rechecked and is resting comfortably receiving IV fluids. Informed pt of radiology and lab results. Discussed discharge plan with pt at bedside and pt agreed to plan.  Labs Reviewed  CBC WITH DIFFERENTIAL - Abnormal; Notable for the following:    RBC 3.85 (*)     Hemoglobin 10.4 (*)     HCT 32.3 (*)     Monocytes Relative 13 (*)     All other components within normal limits  COMPREHENSIVE METABOLIC PANEL - Abnormal; Notable for the following:    Glucose, Bld 114 (*)     BUN 55 (*)     Creatinine, Ser 2.27 (*)     GFR calc non Af Amer 29 (*)     GFR calc Af Amer 34 (*)     All other components within normal limits  BASIC METABOLIC PANEL - Abnormal; Notable for the following:    BUN 53 (*)     Creatinine, Ser 2.11 (*)     GFR calc non Af Amer 32 (*)     GFR calc Af Amer 37 (*)     All other components within normal limits   Dg Chest 2 View  08/02/2012  *RADIOLOGY REPORT*  Clinical Data: Cough and shortness of breath.  CHEST - 2 VIEW  Comparison: 10/14/2011  Findings: The patient has borderline cardiomegaly.  Pulmonary vascularity is normal.  There is peribronchial  thickening with slight atelectasis in the right middle lobe.  No effusions or consolidative infiltrates.  No osseous abnormality.  IMPRESSION: Bronchitic changes with slight atelectasis in the right middle lobe.   Original Report Authenticated By: Allayne Gitelman.  MAXWELL, M.D.      1. Bronchitis   2. Community acquired pneumonia   3. Dehydration       MDM  Jabez Smtih Vultaggio is a 61 y.o. male here with wheezing, cough, dehydration. He likely has bronchitis but CXR showed RML atelectasis. His labs showed nl WBC, CMP showed Cr 2.27 (basline 1.36). Patient is also orthostatic initially. After hydration, IV avelox, and nebs, patient felt better. He is no longer orthostatic. Repeat BMP showed Cr improving to 2.11 from 2.27. I told patient to stay hydrated and use albuterol pump and take antibiotics. D/c home in stable condition.    This document was completed by the scribe at my direction and I have reviewed its accuracy. I have personally examined the patient and agrees with the above document.   Chaney Malling, MD       Timothy Canal, MD 08/03/12 0100

## 2012-08-03 LAB — BASIC METABOLIC PANEL
BUN: 53 mg/dL — ABNORMAL HIGH (ref 6–23)
GFR calc Af Amer: 37 mL/min — ABNORMAL LOW (ref >90)
GFR calc non Af Amer: 32 mL/min — ABNORMAL LOW (ref >90)
Potassium: 4.5 mEq/L (ref 3.5–5.1)

## 2012-08-03 MED ORDER — MOXIFLOXACIN HCL 400 MG PO TABS: 400.0000 mg | ORAL_TABLET | Freq: Every day | ORAL | Status: DC

## 2012-08-03 MED ORDER — ALBUTEROL SULFATE HFA 108 (90 BASE) MCG/ACT IN AERS: 2.0000 | INHALATION_SPRAY | RESPIRATORY_TRACT | Status: DC | PRN

## 2012-09-28 ENCOUNTER — Emergency Department (HOSPITAL_COMMUNITY): Payer: Medicare HMO

## 2012-09-28 ENCOUNTER — Emergency Department (HOSPITAL_COMMUNITY)
Admission: EM | Admit: 2012-09-28 | Discharge: 2012-09-28 | Disposition: A | Discharge status: Home / Self Care | Payer: Medicare HMO | Attending: Emergency Medicine | Admitting: Emergency Medicine

## 2012-09-28 ENCOUNTER — Encounter (HOSPITAL_COMMUNITY): Payer: Self-pay | Admitting: *Deleted

## 2012-09-28 DIAGNOSIS — R112 Nausea with vomiting, unspecified: Secondary | ICD-10-CM | POA: Insufficient documentation

## 2012-09-28 DIAGNOSIS — Z79899 Other long term (current) drug therapy: Secondary | ICD-10-CM | POA: Insufficient documentation

## 2012-09-28 DIAGNOSIS — I10 Essential (primary) hypertension: Secondary | ICD-10-CM | POA: Insufficient documentation

## 2012-09-28 DIAGNOSIS — R110 Nausea: Secondary | ICD-10-CM

## 2012-09-28 DIAGNOSIS — E119 Type 2 diabetes mellitus without complications: Secondary | ICD-10-CM | POA: Insufficient documentation

## 2012-09-28 DIAGNOSIS — Z794 Long term (current) use of insulin: Secondary | ICD-10-CM | POA: Insufficient documentation

## 2012-09-28 DIAGNOSIS — Z940 Kidney transplant status: Secondary | ICD-10-CM | POA: Insufficient documentation

## 2012-09-28 DIAGNOSIS — K5900 Constipation, unspecified: Secondary | ICD-10-CM | POA: Insufficient documentation

## 2012-09-28 LAB — CBC WITH DIFFERENTIAL/PLATELET
Basophils Absolute: 0 10*3/uL (ref 0.0–0.1)
Basophils Relative: 0 % (ref 0–1)
Eosinophils Relative: 3 % (ref 0–5)
HCT: 33.3 % — ABNORMAL LOW (ref 39.0–52.0)
Lymphocytes Relative: 31 % (ref 12–46)
MCHC: 32.4 g/dL (ref 30.0–36.0)
MCV: 83.7 fL (ref 78.0–100.0)
Monocytes Absolute: 0.7 10*3/uL (ref 0.1–1.0)
Neutro Abs: 1.8 10*3/uL (ref 1.7–7.7)
Platelets: 195 10*3/uL (ref 150–400)
RDW: 13.5 % (ref 11.5–15.5)
WBC: 3.8 10*3/uL — ABNORMAL LOW (ref 4.0–10.5)

## 2012-09-28 LAB — URINALYSIS, ROUTINE W REFLEX MICROSCOPIC
Glucose, UA: NEGATIVE mg/dL
Ketones, ur: NEGATIVE mg/dL
Leukocytes, UA: NEGATIVE
Protein, ur: NEGATIVE mg/dL
Urobilinogen, UA: 1 mg/dL (ref 0.0–1.0)

## 2012-09-28 LAB — COMPREHENSIVE METABOLIC PANEL
ALT: 10 U/L (ref 0–53)
AST: 13 U/L (ref 0–37)
Albumin: 3.9 g/dL (ref 3.5–5.2)
Calcium: 9 mg/dL (ref 8.4–10.5)
Creatinine, Ser: 1.92 mg/dL — ABNORMAL HIGH (ref 0.50–1.35)
Sodium: 137 mEq/L (ref 135–145)

## 2012-09-28 LAB — TROPONIN I: Troponin I: <0.3 ng/mL (ref <0.30)

## 2012-09-28 MED ORDER — POLYETHYLENE GLYCOL 3350 17 GM/SCOOP PO POWD: 17.0000 g | Freq: Every day | ORAL | Status: DC

## 2012-09-28 MED ORDER — ONDANSETRON 8 MG PO TBDP: 8.0000 mg | ORAL_TABLET | Freq: Three times a day (TID) | ORAL | Status: DC | PRN

## 2012-09-28 MED ORDER — ONDANSETRON HCL 4 MG/2ML IJ SOLN
4.0000 mg | Freq: Once | INTRAMUSCULAR | Status: AC
  Administered 2012-09-28: 4 mg via INTRAVENOUS
  Filled 2012-09-28: qty 2

## 2012-09-28 NOTE — ED Notes (Signed)
PT is here intermittent nausea and some vomiting for 2 weeks.  Pt was seen at [facility] last week and thought it was related to some of his medication.  Chills.  No diarrhea, reports possible constipation.  Pt reports bilateral knee pain.

## 2012-09-28 NOTE — ED Notes (Signed)
Patient transported to X-ray 

## 2012-09-28 NOTE — ED Provider Notes (Signed)
History     CSN: [id number]  Arrival date & time 09/28/12  1122   First MD Initiated Contact with Patient 09/28/12 1412      No chief complaint on file.   (Consider location/radiation/quality/duration/timing/severity/associated sxs/prior treatment) HPI Comments: Patient presents today with a chief complaint of intermittent nausea and vomiting over the past 2 weeks.  Last episode of vomiting was 8 days ago.  He saw his PCP for this 6 days ago and was told that it was related to his blood pressure medication.  He was taken off two of his medications at that time.  He has not had any vomiting since then, but his nausea has persisted.  He denies diarrhea.  He reports that he has been constipated.  His last regular bowel movement was one week ago.  He reports that his bowel movements since that time have been small and hard.  He has not taken any medication for constipation. He denies fever, but has had chills.  He also reports intermittent generalized abdominal pain over the past 2 weeks.  No abdominal pain at this time.  His PCP is Dr. Cloyd Stagers Bonsu.   PMH significant for Kidney Transplant in 2004.    He also reports bilateral knee pain that has been present for the past 6 months.  He was told by his PCP that it is OA.  No swelling, erythema, or warmth of the knees.  Full ROM of both knees.  He is able to ambulate.  The history is provided by the patient.    Past Medical History  Diagnosis Date  . Diabetes mellitus   . Hypertension     Past Surgical History  Procedure Date  . Kidney transplant     No family history on file.  History  Substance Use Topics  . Smoking status: Never Smoker   . Smokeless tobacco: Never Used  . Alcohol Use: No      Review of Systems  Constitutional: Positive for chills and appetite change. Negative for fever.  Gastrointestinal: Positive for nausea and constipation. Negative for diarrhea, blood in stool and abdominal distention.       Intermittent  abdominal pain Vomiting one week ago  Genitourinary: Negative for dysuria, urgency, frequency, hematuria, decreased urine volume, scrotal swelling, difficulty urinating and testicular pain.  Neurological: Negative for dizziness, syncope and light-headedness.    Allergies  Iodine and Shellfish allergy  Home Medications   Current Outpatient Rx  Name  Route  Sig  Dispense  Refill  . ALBUTEROL SULFATE HFA 108 (90 BASE) MCG/ACT IN AERS   Inhalation   Inhale 2 puffs into the lungs every 4 (four) hours as needed for wheezing.   1 Inhaler   0   . AMLODIPINE BESYLATE 5 MG PO TABS   Oral   Take 5 mg by mouth daily.           . ASPIRIN 81 MG PO TABS   Oral   Take 81 mg by mouth daily.           . ATORVASTATIN CALCIUM 80 MG PO TABS   Oral   Take 80 mg by mouth daily.           Marland Kitchen FEXOFENADINE HCL 180 MG PO TABS   Oral   Take 180 mg by mouth daily.           Marland Kitchen FLUTICASONE PROPIONATE 50 MCG/ACT NA SUSP   Nasal   Place 1 spray into the nose daily.           Marland Kitchen  FUROSEMIDE 80 MG PO TABS   Oral   Take 80 mg by mouth daily.           Marland Kitchen GABAPENTIN 300 MG PO CAPS   Oral   Take 300 mg by mouth 2 (two) times daily.           . INSULIN GLARGINE 100 UNIT/ML [place] SOLN   Subcutaneous   Inject 20-25 Units into the skin daily. Sliding scale         . ISOSORBIDE DINITRATE 30 MG PO TABS   Oral   Take 30 mg by mouth daily.          Marland Kitchen LABETALOL HCL 300 MG PO TABS   Oral   Take 300 mg by mouth 2 (two) times daily.           Marland Kitchen METOCLOPRAMIDE HCL 10 MG PO TABS   Oral   Take 10 mg by mouth daily.          Marland Kitchen MYCOPHENOLATE MOFETIL 250 MG PO CAPS   Oral   Take 250 mg by mouth 2 (two) times daily.           Marland Kitchen OMEPRAZOLE 20 MG PO CPDR   Oral   Take 20 mg by mouth daily.             BP 147/61  Pulse 64  Temp 98 F (36.7 C) (Oral)  Resp 20  Wt 280 lb (127.007 kg)  SpO2 100%  Physical Exam  Nursing note and vitals reviewed. Constitutional: He appears  well-developed and well-nourished. No distress.  HENT:  Head: Normocephalic and atraumatic.  Mouth/Throat: Oropharynx is clear and moist.  Neck: Normal range of motion. Neck supple.  Cardiovascular: Normal rate, regular rhythm and normal heart sounds.   Pulmonary/Chest: Effort normal and breath sounds normal.  Abdominal: Soft. Bowel sounds are normal. He exhibits no distension and no mass. There is no tenderness. There is no rigidity, no rebound and no guarding.  Musculoskeletal: Normal range of motion.       Right knee: He exhibits normal range of motion, no swelling, no effusion and no erythema. no tenderness found.       Left knee: He exhibits normal range of motion, no swelling, no effusion and no erythema. no tenderness found.  Neurological: He is alert.  Skin: Skin is warm and dry. He is not diaphoretic. No erythema.  Psychiatric: He has a normal mood and affect.    ED Course  Procedures (including critical care time)  Labs Reviewed  CBC WITH DIFFERENTIAL - Abnormal; Notable for the following:    WBC 3.8 (*)     RBC 3.98 (*)     Hemoglobin 10.8 (*)     HCT 33.3 (*)     Monocytes Relative 17 (*)     All other components within normal limits  COMPREHENSIVE METABOLIC PANEL - Abnormal; Notable for the following:    Glucose, Bld 104 (*)     BUN 36 (*)     Creatinine, Ser 1.92 (*)     GFR calc non Af Amer 36 (*)     GFR calc Af Amer 41 (*)     All other components within normal limits  URINALYSIS, ROUTINE W REFLEX MICROSCOPIC   Dg Abd Acute W/chest  09/28/2012  *RADIOLOGY REPORT*  Clinical Data: Nausea, vomiting, abdominal pain, history diabetes, hypertension  ACUTE ABDOMEN SERIES (ABDOMEN 2 VIEW & CHEST 1 VIEW)  Comparison: 08/02/2012  Findings: Enlargement of cardiac silhouette  with slight pulmonary vascular congestion. Atherosclerotic calcification aorta. Minimal chronic peribronchial thickening and accentuation right basilar markings question right basilar atelectasis. No  definite acute infiltrate, pleural effusion or pneumothorax. Nonobstructive bowel gas pattern. Scattered stool throughout colon. No bowel dilatation, bowel wall thickening, or free intraperitoneal air. Bones demineralized.  IMPRESSION: No acute abdominal findings. Enlargement of cardiac silhouette with pulmonary vascular congestion. Bronchitic changes with question minimal right basilar atelectasis.   Original Report Authenticated By: Ulyses Southward, M.D.      No diagnosis found.  Patient discussed with Dr. Clarene Duke.    5:20 PM Patient able to tolerate drinking po liquids.    MDM  Patient presenting today with a chief complaint of persistent nausea that has been present for the past 2 weeks.  No vomiting in the past week.  He has also been constipated.  Patient afebrile.  VSS.  Labs unremarkable.  UA negative. No ischemic changes on EKG.  Negative Troponin.  No abdominal findings on Acute Abdominal series.  No abdominal tenderness to palpation on exam.  Patient able to tolerate po liquids.  Therefore, feel that the patient can be discharged home with prescription for Zofran.  Return precautions discussed with the patient.  Patient and wife in agreement with the plan.          Pascal Lux [place], PA-C 09/29/12 1255

## 2012-09-28 NOTE — ED Notes (Signed)
Pt returned from xray

## 2012-09-29 NOTE — ED Provider Notes (Signed)
Medical screening examination/treatment/procedure(s) were performed by non-physician practitioner and as supervising physician I was immediately available for consultation/collaboration.   Laray Anger, DO 09/29/12 1359

## 2012-11-28 ENCOUNTER — Inpatient Hospital Stay (HOSPITAL_COMMUNITY)
Admission: EM | Admit: 2012-11-28 | Discharge: 2012-12-04 | DRG: 682 | Disposition: A | Discharge status: Home Health | Payer: Medicare HMO | Attending: Internal Medicine | Admitting: Internal Medicine

## 2012-11-28 ENCOUNTER — Encounter (HOSPITAL_COMMUNITY): Payer: Self-pay | Admitting: Nurse Practitioner

## 2012-11-28 ENCOUNTER — Emergency Department (HOSPITAL_COMMUNITY): Payer: Medicare HMO

## 2012-11-28 DIAGNOSIS — G4730 Sleep apnea, unspecified: Secondary | ICD-10-CM

## 2012-11-28 DIAGNOSIS — E1149 Type 2 diabetes mellitus with other diabetic neurological complication: Secondary | ICD-10-CM

## 2012-11-28 DIAGNOSIS — I517 Cardiomegaly: Secondary | ICD-10-CM

## 2012-11-28 DIAGNOSIS — Z794 Long term (current) use of insulin: Secondary | ICD-10-CM

## 2012-11-28 DIAGNOSIS — E1142 Type 2 diabetes mellitus with diabetic polyneuropathy: Secondary | ICD-10-CM | POA: Diagnosis present

## 2012-11-28 DIAGNOSIS — I5031 Acute diastolic (congestive) heart failure: Secondary | ICD-10-CM | POA: Diagnosis not present

## 2012-11-28 DIAGNOSIS — N183 Chronic kidney disease, stage 3 unspecified: Secondary | ICD-10-CM | POA: Diagnosis present

## 2012-11-28 DIAGNOSIS — IMO0002 Reserved for concepts with insufficient information to code with codable children: Secondary | ICD-10-CM

## 2012-11-28 DIAGNOSIS — D72829 Elevated white blood cell count, unspecified: Secondary | ICD-10-CM

## 2012-11-28 DIAGNOSIS — R7881 Bacteremia: Secondary | ICD-10-CM | POA: Diagnosis present

## 2012-11-28 DIAGNOSIS — Z79899 Other long term (current) drug therapy: Secondary | ICD-10-CM

## 2012-11-28 DIAGNOSIS — E119 Type 2 diabetes mellitus without complications: Secondary | ICD-10-CM

## 2012-11-28 DIAGNOSIS — R4781 Slurred speech: Secondary | ICD-10-CM | POA: Diagnosis present

## 2012-11-28 DIAGNOSIS — E162 Hypoglycemia, unspecified: Secondary | ICD-10-CM | POA: Diagnosis present

## 2012-11-28 DIAGNOSIS — E1169 Type 2 diabetes mellitus with other specified complication: Secondary | ICD-10-CM | POA: Diagnosis present

## 2012-11-28 DIAGNOSIS — B9689 Other specified bacterial agents as the cause of diseases classified elsewhere: Secondary | ICD-10-CM | POA: Diagnosis present

## 2012-11-28 DIAGNOSIS — N179 Acute kidney failure, unspecified: Secondary | ICD-10-CM

## 2012-11-28 DIAGNOSIS — L0201 Cutaneous abscess of face: Secondary | ICD-10-CM

## 2012-11-28 DIAGNOSIS — Z940 Kidney transplant status: Secondary | ICD-10-CM

## 2012-11-28 DIAGNOSIS — I129 Hypertensive chronic kidney disease with stage 1 through stage 4 chronic kidney disease, or unspecified chronic kidney disease: Secondary | ICD-10-CM | POA: Diagnosis present

## 2012-11-28 DIAGNOSIS — R197 Diarrhea, unspecified: Secondary | ICD-10-CM | POA: Diagnosis not present

## 2012-11-28 DIAGNOSIS — E872 Acidosis, unspecified: Secondary | ICD-10-CM | POA: Diagnosis not present

## 2012-11-28 DIAGNOSIS — R112 Nausea with vomiting, unspecified: Secondary | ICD-10-CM | POA: Diagnosis present

## 2012-11-28 DIAGNOSIS — E1143 Type 2 diabetes mellitus with diabetic autonomic (poly)neuropathy: Secondary | ICD-10-CM | POA: Diagnosis present

## 2012-11-28 DIAGNOSIS — I509 Heart failure, unspecified: Secondary | ICD-10-CM | POA: Diagnosis not present

## 2012-11-28 DIAGNOSIS — N170 Acute kidney failure with tubular necrosis: Principal | ICD-10-CM | POA: Diagnosis present

## 2012-11-28 DIAGNOSIS — R6510 Systemic inflammatory response syndrome (SIRS) of non-infectious origin without acute organ dysfunction: Secondary | ICD-10-CM

## 2012-11-28 DIAGNOSIS — N390 Urinary tract infection, site not specified: Secondary | ICD-10-CM | POA: Diagnosis present

## 2012-11-28 DIAGNOSIS — J209 Acute bronchitis, unspecified: Secondary | ICD-10-CM | POA: Diagnosis present

## 2012-11-28 DIAGNOSIS — I10 Essential (primary) hypertension: Secondary | ICD-10-CM

## 2012-11-28 DIAGNOSIS — Z7982 Long term (current) use of aspirin: Secondary | ICD-10-CM

## 2012-11-28 LAB — CBC WITH DIFFERENTIAL/PLATELET
Basophils Relative: 0 % (ref 0–1)
HCT: 31.8 % — ABNORMAL LOW (ref 39.0–52.0)
Hemoglobin: 9.8 g/dL — ABNORMAL LOW (ref 13.0–17.0)
Lymphocytes Relative: 3 % — ABNORMAL LOW (ref 12–46)
Lymphs Abs: 0.4 10*3/uL — ABNORMAL LOW (ref 0.7–4.0)
Monocytes Relative: 10 % (ref 3–12)
Neutro Abs: 11.8 10*3/uL — ABNORMAL HIGH (ref 1.7–7.7)
Neutrophils Relative %: 87 % — ABNORMAL HIGH (ref 43–77)
RBC: 3.75 MIL/uL — ABNORMAL LOW (ref 4.22–5.81)
WBC: 13.5 10*3/uL — ABNORMAL HIGH (ref 4.0–10.5)

## 2012-11-28 LAB — COMPREHENSIVE METABOLIC PANEL
Albumin: 3.8 g/dL (ref 3.5–5.2)
Alkaline Phosphatase: 43 U/L (ref 39–117)
BUN: 60 mg/dL — ABNORMAL HIGH (ref 6–23)
CO2: 18 mEq/L — ABNORMAL LOW (ref 19–32)
Chloride: 106 mEq/L (ref 96–112)
GFR calc non Af Amer: 31 mL/min — ABNORMAL LOW (ref >90)
Potassium: 4.8 mEq/L (ref 3.5–5.1)
Total Bilirubin: 0.5 mg/dL (ref 0.3–1.2)

## 2012-11-28 LAB — URINALYSIS, ROUTINE W REFLEX MICROSCOPIC
Bilirubin Urine: NEGATIVE
Ketones, ur: NEGATIVE mg/dL
Nitrite: NEGATIVE
Protein, ur: NEGATIVE mg/dL
Urobilinogen, UA: 1 mg/dL (ref 0.0–1.0)
pH: 5 (ref 5.0–8.0)

## 2012-11-28 LAB — GLUCOSE, CAPILLARY
Glucose-Capillary: 70 mg/dL (ref 70–99)
Glucose-Capillary: 62 mg/dL — ABNORMAL LOW (ref 70–99)
Glucose-Capillary: 97 mg/dL (ref 70–99)
Glucose-Capillary: 128 mg/dL — ABNORMAL HIGH (ref 70–99)

## 2012-11-28 LAB — URINE MICROSCOPIC-ADD ON

## 2012-11-28 LAB — LACTIC ACID, PLASMA: Lactic Acid, Venous: 1.3 mmol/L (ref 0.5–2.2)

## 2012-11-28 LAB — LIPASE, BLOOD: Lipase: 21 U/L (ref 11–59)

## 2012-11-28 MED ORDER — ACETAMINOPHEN 325 MG PO TABS
975.0000 mg | ORAL_TABLET | Freq: Once | ORAL | Status: AC
  Administered 2012-11-28: 975 mg via ORAL
  Filled 2012-11-28: qty 3

## 2012-11-28 MED ORDER — ONDANSETRON 8 MG PO TBDP
8.0000 mg | ORAL_TABLET | Freq: Three times a day (TID) | ORAL | Status: DC | PRN
  Administered 2012-11-30: 8 mg via ORAL
  Filled 2012-11-28: qty 1

## 2012-11-28 MED ORDER — MYCOPHENOLATE MOFETIL 250 MG PO CAPS
1000.0000 mg | ORAL_CAPSULE | Freq: Two times a day (BID) | ORAL | Status: DC
  Administered 2012-11-28 – 2012-12-04 (×12): 1000 mg via ORAL
  Filled 2012-11-28 (×13): qty 4

## 2012-11-28 MED ORDER — METOCLOPRAMIDE HCL 10 MG PO TABS
10.0000 mg | ORAL_TABLET | Freq: Every day | ORAL | Status: DC
  Administered 2012-11-28 – 2012-12-04 (×7): 10 mg via ORAL
  Filled 2012-11-28 (×8): qty 1

## 2012-11-28 MED ORDER — ASPIRIN 81 MG PO TABS: 81.0000 mg | ORAL_TABLET | Freq: Every day | ORAL | Status: DC

## 2012-11-28 MED ORDER — ALBUTEROL SULFATE HFA 108 (90 BASE) MCG/ACT IN AERS
2.0000 | INHALATION_SPRAY | RESPIRATORY_TRACT | Status: DC | PRN
  Filled 2012-11-28: qty 6.7

## 2012-11-28 MED ORDER — ALBUTEROL SULFATE (5 MG/ML) 0.5% IN NEBU
2.5000 mg | INHALATION_SOLUTION | Freq: Four times a day (QID) | RESPIRATORY_TRACT | Status: DC
  Administered 2012-11-28 – 2012-12-01 (×11): 2.5 mg via RESPIRATORY_TRACT
  Filled 2012-11-28 (×11): qty 0.5

## 2012-11-28 MED ORDER — INSULIN ASPART 100 UNIT/ML ~~LOC~~ SOLN
0.0000 [IU] | Freq: Three times a day (TID) | SUBCUTANEOUS | Status: DC
  Administered 2012-11-29 – 2012-11-30 (×3): 1 [IU] via SUBCUTANEOUS
  Administered 2012-12-01 – 2012-12-02 (×3): 2 [IU] via SUBCUTANEOUS
  Administered 2012-12-02: 1 [IU] via SUBCUTANEOUS
  Administered 2012-12-03: 2 [IU] via SUBCUTANEOUS
  Administered 2012-12-03: 1 [IU] via SUBCUTANEOUS
  Administered 2012-12-03: 3 [IU] via SUBCUTANEOUS
  Administered 2012-12-04: 5 [IU] via SUBCUTANEOUS
  Administered 2012-12-04: 3 [IU] via SUBCUTANEOUS

## 2012-11-28 MED ORDER — IPRATROPIUM BROMIDE 0.02 % IN SOLN
0.5000 mg | Freq: Four times a day (QID) | RESPIRATORY_TRACT | Status: DC
  Administered 2012-11-28 – 2012-12-01 (×10): 0.5 mg via RESPIRATORY_TRACT
  Filled 2012-11-28 (×12): qty 2.5

## 2012-11-28 MED ORDER — SODIUM CHLORIDE 0.9 % IV BOLUS (SEPSIS)
1000.0000 mL | Freq: Once | INTRAVENOUS | Status: AC
  Administered 2012-11-28: 1000 mL via INTRAVENOUS

## 2012-11-28 MED ORDER — GUAIFENESIN-DM 100-10 MG/5ML PO SYRP
5.0000 mL | ORAL_SOLUTION | ORAL | Status: DC | PRN
  Administered 2012-11-30: 5 mL via ORAL
  Filled 2012-11-28: qty 5

## 2012-11-28 MED ORDER — ASPIRIN 81 MG PO CHEW
81.0000 mg | CHEWABLE_TABLET | Freq: Every day | ORAL | Status: DC
  Administered 2012-11-28 – 2012-12-04 (×7): 81 mg via ORAL
  Filled 2012-11-28 (×7): qty 1

## 2012-11-28 MED ORDER — MYCOPHENOLATE MOFETIL 250 MG PO CAPS: 250.0000 mg | ORAL_CAPSULE | Freq: Two times a day (BID) | ORAL | Status: DC

## 2012-11-28 MED ORDER — HEPARIN SODIUM (PORCINE) 5000 UNIT/ML IJ SOLN
5000.0000 [IU] | Freq: Three times a day (TID) | INTRAMUSCULAR | Status: DC
  Administered 2012-11-28 – 2012-12-04 (×17): 5000 [IU] via SUBCUTANEOUS
  Filled 2012-11-28 (×20): qty 1

## 2012-11-28 MED ORDER — AMLODIPINE BESYLATE 5 MG PO TABS
5.0000 mg | ORAL_TABLET | Freq: Every day | ORAL | Status: DC
  Administered 2012-11-28 – 2012-12-04 (×7): 5 mg via ORAL
  Filled 2012-11-28 (×8): qty 1

## 2012-11-28 MED ORDER — ONDANSETRON HCL 4 MG/2ML IJ SOLN
4.0000 mg | Freq: Four times a day (QID) | INTRAMUSCULAR | Status: DC | PRN
  Administered 2012-11-28: 4 mg via INTRAVENOUS

## 2012-11-28 MED ORDER — LABETALOL HCL 300 MG PO TABS
300.0000 mg | ORAL_TABLET | Freq: Two times a day (BID) | ORAL | Status: DC
  Administered 2012-11-28 – 2012-12-04 (×12): 300 mg via ORAL
  Filled 2012-11-28 (×13): qty 1

## 2012-11-28 MED ORDER — ISOSORBIDE DINITRATE 30 MG PO TABS
30.0000 mg | ORAL_TABLET | Freq: Every day | ORAL | Status: DC
  Administered 2012-11-29 – 2012-12-04 (×6): 30 mg via ORAL
  Filled 2012-11-28 (×6): qty 1

## 2012-11-28 MED ORDER — ONDANSETRON HCL 4 MG/2ML IJ SOLN
INTRAMUSCULAR | Status: AC
  Administered 2012-11-28: 4 mg via INTRAVENOUS
  Filled 2012-11-28: qty 2

## 2012-11-28 MED ORDER — SODIUM CHLORIDE 0.9 % IV SOLN
INTRAVENOUS | Status: DC
Start: 2012-11-28 — End: 2012-12-02
  Administered 2012-11-29: 1000 mL via INTRAVENOUS
  Administered 2012-11-30 – 2012-12-02 (×3): via INTRAVENOUS

## 2012-11-28 MED ORDER — GUAIFENESIN ER 600 MG PO TB12
1200.0000 mg | ORAL_TABLET | Freq: Two times a day (BID) | ORAL | Status: DC
  Administered 2012-11-28 – 2012-12-04 (×12): 1200 mg via ORAL
  Filled 2012-11-28 (×13): qty 2

## 2012-11-28 MED ORDER — DEXTROSE 5 % IV SOLN
1.0000 g | INTRAVENOUS | Status: DC
  Administered 2012-11-28: 1 g via INTRAVENOUS
  Filled 2012-11-28 (×2): qty 10

## 2012-11-28 MED ORDER — OSELTAMIVIR PHOSPHATE 75 MG PO CAPS
75.0000 mg | ORAL_CAPSULE | Freq: Two times a day (BID) | ORAL | Status: DC
  Administered 2012-11-28: 75 mg via ORAL
  Filled 2012-11-28 (×3): qty 1

## 2012-11-28 MED ORDER — ATORVASTATIN CALCIUM 80 MG PO TABS
80.0000 mg | ORAL_TABLET | Freq: Every day | ORAL | Status: DC
  Administered 2012-11-29: 80 mg via ORAL
  Filled 2012-11-28 (×2): qty 1

## 2012-11-28 MED ORDER — GABAPENTIN 300 MG PO CAPS
300.0000 mg | ORAL_CAPSULE | Freq: Two times a day (BID) | ORAL | Status: DC
  Administered 2012-11-28 – 2012-12-04 (×12): 300 mg via ORAL
  Filled 2012-11-28 (×15): qty 1

## 2012-11-28 MED ORDER — PANTOPRAZOLE SODIUM 40 MG PO TBEC
40.0000 mg | DELAYED_RELEASE_TABLET | Freq: Every day | ORAL | Status: DC
  Administered 2012-11-28 – 2012-12-04 (×7): 40 mg via ORAL
  Filled 2012-11-28 (×7): qty 1

## 2012-11-28 MED ORDER — SODIUM CHLORIDE 0.9 % IJ SOLN
3.0000 mL | Freq: Two times a day (BID) | INTRAMUSCULAR | Status: DC
  Administered 2012-11-28 – 2012-12-04 (×8): 3 mL via INTRAVENOUS

## 2012-11-28 MED ORDER — DEXTROSE 5 % IV SOLN
500.0000 mg | INTRAVENOUS | Status: DC
  Administered 2012-11-29 – 2012-11-30 (×3): 500 mg via INTRAVENOUS
  Filled 2012-11-28 (×4): qty 500

## 2012-11-28 MED ORDER — ALBUTEROL SULFATE (5 MG/ML) 0.5% IN NEBU
2.5000 mg | INHALATION_SOLUTION | RESPIRATORY_TRACT | Status: DC | PRN
  Filled 2012-11-28: qty 0.5

## 2012-11-28 NOTE — ED Notes (Signed)
Per EMS pt was on bus has episode of slurred speech pt "talking out of his head " and vomiting @ 11:30 upon arrival no symptoms of slurred speech CBG 50 given 25g of D10. Pt sts "talking out of head" is sign of low blood sugar. BP 150/106 HR irregular couplet PVCs denies pain, SOB

## 2012-11-28 NOTE — ED Notes (Addendum)
Pt c/o generalized weakness, chills and body aches x3 days, nausea and vomiting started today. Pt sts taking 25 units of lantus this AM but has not been able to keep any food down. Pt. Unable to remember slurred speech episode, wife sts this has happened in the past when his CBG is low.

## 2012-11-28 NOTE — ED Provider Notes (Signed)
History     CSN: [id number]  Arrival date & time 11/28/12  1318   First MD Initiated Contact with Patient 11/28/12 1354      Chief Complaint  Patient presents with  . Hypoglycemia  . Emesis  . slurred speech     (Consider location/radiation/quality/duration/timing/severity/associated sxs/prior treatment) The history is provided by the patient and medical records.    [name] is a 62 y.o. male  with a hx of IDDM, HTN and kidney transplant presents to the Emergency Department complaining of gradual, persistent, progressively worsening hypoglycemia onset 1 hr ago.  Pt took his insulin this AM and then did not eat causing a hypoglycemia episode. EMS states patient had "an episode of slurred speech and talking out of his head" and was associated with vomiting approximately 11:30 this morning.  EMS states on arrival CBG was 50 the patient was given 25 g of D10 with resolution of symptoms. Wife states that the slurred speech and "crazy talk" is often associated with his blood sugar being low which prompted her 911 call.  Patient also exposed to a child with flulike illness approximately 2 days ago who also had nausea vomiting and diarrhea.  Patient had several episodes of nausea and vomiting this morning around the time that his blood sugar dropped.   He currently denies abdominal pain, nausea.  Associated symptoms include hyperglycemia, abdominal size, nausea, vomiting, general illness.  Nothing makes it better and nothing makes it worse.  Pt denies fever, chills, headache, neck pain, chest pain, abdominal pain, diarrhea, weakness, dizziness, syncope, dysuria, hematuria.    Past Medical History  Diagnosis Date  . Diabetes mellitus   . Hypertension     Past Surgical History  Procedure Date  . Kidney transplant     No family history on file.  History  Substance Use Topics  . Smoking status: Never Smoker   . Smokeless tobacco: Never Used  . Alcohol Use: No      Review of  Systems  Constitutional: Negative for fever, diaphoresis, appetite change, fatigue and unexpected weight change.       Transient altered mental status  HENT: Negative for mouth sores and neck stiffness.   Eyes: Negative for visual disturbance.  Respiratory: Negative for cough, chest tightness, shortness of breath and wheezing.   Cardiovascular: Negative for chest pain.  Gastrointestinal: Positive for nausea and vomiting. Negative for abdominal pain, diarrhea and constipation.  Genitourinary: Negative for dysuria, urgency, frequency and hematuria.  Skin: Negative for rash.  Neurological: Negative for syncope, light-headedness and headaches.  Psychiatric/Behavioral: Negative for sleep disturbance. The patient is not nervous/anxious.   All other systems reviewed and are negative.    Allergies  Iodine and Shellfish allergy  Home Medications   Current Outpatient Rx  Name  Route  Sig  Dispense  Refill  . AMLODIPINE BESYLATE 5 MG PO TABS   Oral   Take 5 mg by mouth daily.           . ASPIRIN 81 MG PO TABS   Oral   Take 81 mg by mouth daily.           . ATORVASTATIN CALCIUM 80 MG PO TABS   Oral   Take 80 mg by mouth daily.           Marland Kitchen FEXOFENADINE HCL 180 MG PO TABS   Oral   Take 180 mg by mouth daily.           Marland Kitchen FLUTICASONE PROPIONATE  50 MCG/ACT NA SUSP   Nasal   Place 1 spray into the nose daily.           . FUROSEMIDE 80 MG PO TABS   Oral   Take 80 mg by mouth daily.           Marland Kitchen GABAPENTIN 300 MG PO CAPS   Oral   Take 300 mg by mouth 2 (two) times daily.           . INSULIN GLARGINE 100 UNIT/ML [place] SOLN   Subcutaneous   Inject 20-25 Units into the skin daily. Sliding scale         . ISOSORBIDE DINITRATE 30 MG PO TABS   Oral   Take 30 mg by mouth daily.          Marland Kitchen LABETALOL HCL 300 MG PO TABS   Oral   Take 300 mg by mouth 2 (two) times daily.           Marland Kitchen METOCLOPRAMIDE HCL 10 MG PO TABS   Oral   Take 10 mg by mouth daily.          Marland Kitchen  MYCOPHENOLATE MOFETIL 250 MG PO CAPS   Oral   Take 250 mg by mouth 2 (two) times daily.           Marland Kitchen OMEPRAZOLE 20 MG PO CPDR   Oral   Take 20 mg by mouth daily.           . ALBUTEROL SULFATE HFA 108 (90 BASE) MCG/ACT IN AERS   Inhalation   Inhale 2 puffs into the lungs every 4 (four) hours as needed for wheezing.   1 Inhaler   0   . ONDANSETRON 8 MG PO TBDP   Oral   Take 1 tablet (8 mg total) by mouth every 8 (eight) hours as needed for nausea.   20 tablet   0     BP 123/35  Pulse 96  Temp 98.6 F (37 C) (Oral)  Resp 19  SpO2 100%  Physical Exam  Nursing note and vitals reviewed. Constitutional: He appears well-developed and well-nourished. No distress.       Ill appearing  HENT:  Head: Normocephalic and atraumatic.  Mouth/Throat: Oropharynx is clear and moist and mucous membranes are normal. No oropharyngeal exudate.  Eyes: Conjunctivae normal are normal. Pupils are equal, round, and reactive to light. No scleral icterus.  Neck: Normal range of motion. Neck supple.  Cardiovascular: Normal rate, regular rhythm, S1 normal, S2 normal, normal heart sounds and intact distal pulses.  Exam reveals no gallop and no friction rub.   No murmur heard. Pulses:      Radial pulses are 2+ on the right side, and 2+ on the left side.       Dorsalis pedis pulses are 2+ on the right side, and 2+ on the left side.       Posterior tibial pulses are 2+ on the right side, and 2+ on the left side.  Pulmonary/Chest: Breath sounds normal. Tachypnea noted. No respiratory distress. He has no decreased breath sounds. He has no wheezes. He has no rhonchi. He has no rales. He exhibits no tenderness.  Abdominal: Soft. Bowel sounds are normal. He exhibits no distension and no mass. There is no tenderness. There is no rigidity, no rebound, no guarding, no CVA tenderness, no tenderness at McBurney's point and negative Murphy's sign.  Musculoskeletal: Normal range of motion. He exhibits no edema and no  tenderness.  Lymphadenopathy:    He has no cervical adenopathy.  Neurological: He is alert. He exhibits normal muscle tone. Coordination normal.       Speech is clear and goal oriented Moves extremities without ataxia  Skin: Skin is warm and dry. No rash noted. He is not diaphoretic. No erythema.  Psychiatric: He has a normal mood and affect.    ED Course  Procedures (including critical care time)  Labs Reviewed  CBC WITH DIFFERENTIAL - Abnormal; Notable for the following:    WBC 13.5 (*)     RBC 3.75 (*)     Hemoglobin 9.8 (*)     HCT 31.8 (*)     Neutrophils Relative 87 (*)     Neutro Abs 11.8 (*)     Lymphocytes Relative 3 (*)     Lymphs Abs 0.4 (*)     Monocytes Absolute 1.3 (*)     All other components within normal limits  COMPREHENSIVE METABOLIC PANEL - Abnormal; Notable for the following:    CO2 18 (*)     Glucose, Bld 56 (*)     BUN 60 (*)     Creatinine, Ser 2.17 (*)     GFR calc non Af Amer 31 (*)     GFR calc Af Amer 36 (*)     All other components within normal limits  GLUCOSE, CAPILLARY - Abnormal; Notable for the following:    Glucose-Capillary 62 (*)     All other components within normal limits  GLUCOSE, CAPILLARY  LIPASE, BLOOD  GLUCOSE, CAPILLARY  INFLUENZA PANEL BY PCR  LACTIC ACID, PLASMA  CULTURE, BLOOD (ROUTINE X 2)  CULTURE, BLOOD (ROUTINE X 2)  URINALYSIS, ROUTINE W REFLEX MICROSCOPIC  URINE CULTURE   No results found.  ECG:  Date: 11/28/2012  Rate: 90  Rhythm: normal sinus rhythm and premature ventricular contractions (PVC)  QRS Axis: normal  Intervals: PR prolonged  ST/T Wave abnormalities: normal  Conduction Disutrbances:first-degree A-V block   Narrative Interpretation: non-ischemic with multiple PVCs  Old EKG Reviewed: changes noted   1. Hypoglycemia   2. Leukocytosis   3. SIRS (systemic inflammatory response syndrome)   4. Nausea and vomiting   5. DM   6. HYPERTENSION, UNSPECIFIED       MDM  [name]  presents after episode of hypoglycemia.  On exam pt alert and oriented, febrile to 100.5.  Likely viral gastritis vs ILI accompanied by hypoglycemia 2/2 to decreased PO intake with insulin administration.  Pt given antipyretics, fluid and PO food.  Repeat CBG before PO intake was 62.  Pt has since eaten and remains alert, but continues to be tachypenic and looks septic.  Pt with elevated WBC in spite of cellcept use for kidney transplant in 2004.  Pt also with elevated kidney function.  Pt meet SIRS criteria.  Will continue to look for source of infection.  Will proceed with admission.    Dr. Samuel Jester was consulted, evaluated this patient with me and agrees with the plan.           Dahlia Client Blayze Haen, PA-C 11/28/12 1701

## 2012-11-28 NOTE — H&P (Signed)
[facility] History and Physical  [name] ZOX:[id number] DOB: 1951-01-26 DOA: 11/28/2012  Referring physician: Dierdre Forth, PA-C PCP: Leanor Rubenstein, MD   Chief Complaint: Generalized weakness  HPI: [name] is a 62 y.o. male with past medical history of diabetes mellitus type 2 and hypertension. Patient has history of renal transplant in 2004. She came in to the hospital because of generalized weakness and slurred speech. He said for the past 2 days he's been nauseous, he is been in contact with his grandson who developed a stomach flu. Patient administered his 25 units of Lantus insulin this morning and after that he started to vomit vigorously. He could not keep anything down. Patient involved generalized weakness after that and slurred speech, his wife said when his blood pressure gets low always manifested as slurred speech. 911 cold, he was given D5W in route to the hospital, his CBGs 97 currently he is fully awake and alert he denies any weakness or slurred speech. Upon further questioning he said he also has nonproductive cough, shortness of breath and fever. Initial evaluation in the emergency department documented fever of 100.5. His BUN is 2.1 which is around his baseline, BUN is 60 and he is slightly acidotic with CO2 of 18.   Review of Systems:  Constitutional: negative for anorexia, fevers and sweats Eyes: negative for irritation, redness and visual disturbance Ears, nose, mouth, throat, and face: negative for earaches, epistaxis, nasal congestion and sore throat Respiratory: Complains about nonproductive cough and SOB. Cardiovascular: negative for chest pain, dyspnea, lower extremity edema, orthopnea, palpitations and syncope Gastrointestinal: Has nausea and vomiting, denies abdominal pain or diarrhea. Genitourinary:negative for dysuria, frequency and hematuria Hematologic/lymphatic: negative for bleeding, easy bruising and  lymphadenopathy Musculoskeletal:negative for arthralgias, muscle weakness and stiff joints Neurological: negative for coordination problems, gait problems, headaches and weakness Endocrine: negative for diabetic symptoms including polydipsia, polyuria and weight loss Allergic/Immunologic: negative for anaphylaxis, hay fever and urticaria   Past Medical History  Diagnosis Date  . Diabetes mellitus   . Hypertension    Past Surgical History  Procedure Date  . Kidney transplant    Social History:  reports that he has never smoked. He has never used smokeless tobacco. He reports that he does not drink alcohol or use illicit drugs.   Allergies  Allergen Reactions  . Iodine     Iv dye  . Shellfish Allergy Swelling    Has throat swelling, tongue swelling, and entire body swells up.     No family history on file.   Prior to Admission medications   Medication Sig Start Date End Date Taking? Authorizing Provider  amLODipine (NORVASC) 5 MG tablet Take 5 mg by mouth daily.     Yes Historical Provider, MD  aspirin 81 MG tablet Take 81 mg by mouth daily.     Yes Historical Provider, MD  atorvastatin (LIPITOR) 80 MG tablet Take 80 mg by mouth daily.     Yes Historical Provider, MD  fexofenadine (ALLEGRA) 180 MG tablet Take 180 mg by mouth daily.     Yes Historical Provider, MD  fluticasone (FLONASE) 50 MCG/ACT nasal spray Place 1 spray into the nose daily.     Yes Historical Provider, MD  furosemide (LASIX) 80 MG tablet Take 80 mg by mouth daily.     Yes Historical Provider, MD  gabapentin (NEURONTIN) 300 MG capsule Take 300 mg by mouth 2 (two) times daily.     Yes Historical Provider, MD  insulin glargine (LANTUS)  100 UNIT/ML injection Inject 20-25 Units into the skin daily. Sliding scale   Yes Historical Provider, MD  isosorbide dinitrate (ISORDIL) 30 MG tablet Take 30 mg by mouth daily.    Yes Historical Provider, MD  labetalol (NORMODYNE) 300 MG tablet Take 300 mg by mouth 2 (two) times  daily.     Yes Historical Provider, MD  metoCLOPramide (REGLAN) 10 MG tablet Take 10 mg by mouth daily.    Yes Historical Provider, MD  mycophenolate (CELLCEPT) 250 MG capsule Take 250 mg by mouth 2 (two) times daily.     Yes Historical Provider, MD  omeprazole (PRILOSEC) 20 MG capsule Take 20 mg by mouth daily.     Yes Historical Provider, MD  albuterol (PROVENTIL HFA;VENTOLIN HFA) 108 (90 BASE) MCG/ACT inhaler Inhale 2 puffs into the lungs every 4 (four) hours as needed for wheezing. 08/03/12   Richardean Canal, MD  ondansetron (ZOFRAN ODT) 8 MG disintegrating tablet Take 1 tablet (8 mg total) by mouth every 8 (eight) hours as needed for nausea. 09/28/12   Heather Anne Shutter, PA-C   Physical Exam: Filed Vitals:   11/28/12 1331 11/28/12 1647  BP: 137/47 123/35  Pulse: 90 96  Temp: 100.5 F (38.1 C) 98.6 F (37 C)  TempSrc: Oral Oral  Resp: 30 19  SpO2: 100% 100%   General appearance: alert, cooperative and no distress  Head: Normocephalic, without obvious abnormality, atraumatic  Eyes: conjunctivae/corneas clear. PERRL, EOM's intact. Fundi benign.  Nose: Nares normal. Septum midline. Mucosa normal. No drainage or sinus tenderness.  Throat: lips, mucosa, and tongue normal; teeth and gums normal  Neck: Supple, no masses, no cervical lymphadenopathy, no JVD appreciated, no meningeal signs Resp: clear to auscultation bilaterally  Chest wall: no tenderness  Cardio: regular rate and rhythm, S1, S2 normal, no murmur, click, rub or gallop  GI: soft, non-tender; bowel sounds normal; no masses, no organomegaly  Extremities: extremities normal, atraumatic, no cyanosis or edema  Skin: Skin color, texture, turgor normal. No rashes or lesions  Neurologic: Alert and oriented X 3, normal strength and tone. Normal symmetric reflexes. Normal coordination and gait   Labs on Admission:  Basic Metabolic Panel:  Lab 11/28/12 1610  NA 137  K 4.8  CL 106  CO2 18*  GLUCOSE 56*  BUN 60*  CREATININE  2.17*  CALCIUM 9.1  MG --  PHOS --   Liver Function Tests:  Lab 11/28/12 1505  AST 12  ALT 7  ALKPHOS 43  BILITOT 0.5  PROT 7.0  ALBUMIN 3.8    Lab 11/28/12 1505  LIPASE 21  AMYLASE --   No results found for this basename: AMMONIA:5 in the last 168 hours CBC:  Lab 11/28/12 1505  WBC 13.5*  NEUTROABS 11.8*  HGB 9.8*  HCT 31.8*  MCV 84.8  PLT 186   Cardiac Enzymes: No results found for this basename: CKTOTAL:5,CKMB:5,CKMBINDEX:5,TROPONINI:5 in the last 168 hours  BNP (last 3 results) No results found for this basename: PROBNP:3 in the last 8760 hours CBG:  Lab 11/28/12 1639 11/28/12 1455 11/28/12 1327  GLUCAP 97 62* 70    Radiological Exams on Admission: Dg Chest 2 View  11/28/2012  *RADIOLOGY REPORT*  Clinical Data: Weakness, shortness of breath, chest pain.  CHEST - 2 VIEW  Comparison: 09/28/2012  Findings: Moderate cardiomegaly, increased since prior study. Atheromatous aorta.  No focal infiltrate or effusion.  Some increase in central pulmonary vascular congestion.  Spurring in the lower thoracic spine.  IMPRESSION:  New  cardiomegaly and central pulmonary vascular congestion.   Original Report Authenticated By: D. Andria Rhein, MD     EKG: Independently reviewed.   Assessment/Plan Principal Problem:  *Acute bronchitis Active Problems:  DM 2  DIABETIC PERIPHERAL NEUROPATHY  History of renal transplant  Hypoglycemia  Nausea and vomiting  Slurred speech   Nausea and vomiting -Likely transient viral gastroenteritis, patient has contact was his sick grandson. -Supportive management with antiemetics, and hydration with IV fluids. -Advance his diet as tolerated.  Acute bronchitis -Started on a azithromycin and Rocephin. -X-ray showed bronchitic changes, he has upper respiratory tract infection symptoms. -Supportive management with inhaled bronchodilators, mucolytics, antitussives and oxygen. -URTI and fever, although he was vaccinated for the flu I  started Tamiflu, check flu PCR and dis/continue accordingly.   Hypoglycemia and diabetes mellitus type 2 -This is likely secondary to the insulin and the poor oral intake. -Patient was given IV D5W and the ambulance on route to the hospital. He started to tolerate diet in the ED. -He is fully awake and alert, was not put on D5W, check blood sugar every 2 hours, diet as tolerated. -Carbohydrate modified diet, insulin sliding scale and check hemoglobin A1c   Status post kidney transplant/CKD -Patient baseline creatinine is about 2.0, he has higher BUN than usual. -Because of nausea and vomiting today, we'll hold his Lasix, hydrate gently with IV fluids. -We will assess in the morning for the continuation of the IV fluids, likely to be discontinued. -Patient is on CellCept, continue. He is not on prednisone. If they creatinine increased we might call nephrology.  Code Status:  Full code Family Communication:  His wife and his church's bishop was at bedside Disposition Plan: inpatient, telemetry  Time spent:  70 minutes  [facility] Pager [phone]  If 7PM-7AM, please contact night-coverage [email or web address] Password [facility] 11/28/2012, 5:45 PM

## 2012-11-29 ENCOUNTER — Inpatient Hospital Stay (HOSPITAL_COMMUNITY): Payer: Medicare HMO

## 2012-11-29 LAB — BASIC METABOLIC PANEL
BUN: 66 mg/dL — ABNORMAL HIGH (ref 6–23)
BUN: 62 mg/dL — ABNORMAL HIGH (ref 6–23)
CO2: 19 mEq/L (ref 19–32)
CO2: 17 mEq/L — ABNORMAL LOW (ref 19–32)
Calcium: 8.7 mg/dL (ref 8.4–10.5)
Calcium: 6.9 mg/dL — ABNORMAL LOW (ref 8.4–10.5)
Chloride: 104 mEq/L (ref 96–112)
Creatinine, Ser: 2.6 mg/dL — ABNORMAL HIGH (ref 0.50–1.35)
Creatinine, Ser: 2.68 mg/dL — ABNORMAL HIGH (ref 0.50–1.35)
GFR calc Af Amer: 29 mL/min — ABNORMAL LOW (ref >90)
GFR calc non Af Amer: 25 mL/min — ABNORMAL LOW (ref >90)
Glucose, Bld: 87 mg/dL (ref 70–99)
Glucose, Bld: 128 mg/dL — ABNORMAL HIGH (ref 70–99)
Potassium: 5.4 mEq/L — ABNORMAL HIGH (ref 3.5–5.1)
Sodium: 136 mEq/L (ref 135–145)
Sodium: 135 mEq/L (ref 135–145)

## 2012-11-29 LAB — IRON AND TIBC: Iron: <10 ug/dL — ABNORMAL LOW (ref 42–135)

## 2012-11-29 LAB — GLUCOSE, CAPILLARY
Glucose-Capillary: 107 mg/dL — ABNORMAL HIGH (ref 70–99)
Glucose-Capillary: 106 mg/dL — ABNORMAL HIGH (ref 70–99)
Glucose-Capillary: 102 mg/dL — ABNORMAL HIGH (ref 70–99)
Glucose-Capillary: 159 mg/dL — ABNORMAL HIGH (ref 70–99)

## 2012-11-29 LAB — CBC
HCT: 31 % — ABNORMAL LOW (ref 39.0–52.0)
Hemoglobin: 9.6 g/dL — ABNORMAL LOW (ref 13.0–17.0)
MCH: 26.3 pg (ref 26.0–34.0)
MCHC: 31 g/dL (ref 30.0–36.0)
MCV: 84.9 fL (ref 78.0–100.0)
Platelets: 159 10*3/uL (ref 150–400)
RBC: 3.65 MIL/uL — ABNORMAL LOW (ref 4.22–5.81)
RDW: 14.4 % (ref 11.5–15.5)
WBC: 14.5 10*3/uL — ABNORMAL HIGH (ref 4.0–10.5)

## 2012-11-29 LAB — MAGNESIUM: Magnesium: 1.8 mg/dL (ref 1.5–2.5)

## 2012-11-29 LAB — CREATININE, URINE, RANDOM: Creatinine, Urine: 130.62 mg/dL

## 2012-11-29 LAB — HEMOGLOBIN A1C: Hgb A1c MFr Bld: 7.2 % — ABNORMAL HIGH (ref <5.7)

## 2012-11-29 LAB — TSH: TSH: 0.579 u[IU]/mL (ref 0.350–4.500)

## 2012-11-29 MED ORDER — CYCLOSPORINE MODIFIED (NEORAL) 25 MG PO CAPS
150.0000 mg | ORAL_CAPSULE | Freq: Two times a day (BID) | ORAL | Status: DC
  Administered 2012-11-29 – 2012-12-04 (×11): 150 mg via ORAL
  Filled 2012-11-29 (×13): qty 6

## 2012-11-29 MED ORDER — MAGNESIUM SULFATE 40 MG/ML IJ SOLN
2.0000 g | Freq: Once | INTRAMUSCULAR | Status: AC
  Administered 2012-11-29: 2 g via INTRAVENOUS
  Filled 2012-11-29: qty 50

## 2012-11-29 MED ORDER — PREDNISONE 5 MG PO TABS
5.0000 mg | ORAL_TABLET | Freq: Every day | ORAL | Status: DC
  Administered 2012-11-29 – 2012-12-04 (×6): 5 mg via ORAL
  Filled 2012-11-29 (×7): qty 1

## 2012-11-29 MED ORDER — CYCLOSPORINE MODIFIED (NEORAL) 25 MG PO CAPS
ORAL_CAPSULE | Freq: Two times a day (BID) | ORAL | Status: DC
  Filled 2012-11-29 (×2): qty 1

## 2012-11-29 MED ORDER — PIPERACILLIN-TAZOBACTAM 3.375 G IVPB
3.3750 g | Freq: Three times a day (TID) | INTRAVENOUS | Status: DC
  Administered 2012-11-29 – 2012-12-01 (×6): 3.375 g via INTRAVENOUS
  Filled 2012-11-29 (×8): qty 50

## 2012-11-29 MED ORDER — ACETAMINOPHEN 325 MG PO TABS
650.0000 mg | ORAL_TABLET | ORAL | Status: DC | PRN
  Administered 2012-11-29 – 2012-12-01 (×6): 650 mg via ORAL
  Filled 2012-11-29 (×6): qty 2

## 2012-11-29 NOTE — Progress Notes (Signed)
VS - Blood pressure 120/62, pulse 82, temperature 102.2 F (39 C), temperature source Oral, resp. rate 24, height 6\' 2"  (1.88 m), SpO2 99.00%., O2   @ 2L [place].  K. Schorr, NP on call notified of elevated temp and pt having episodes of trigeminy and bigeminy on the monitor. Orders entered into [facility] by NP. Julien Nordmann [facility]

## 2012-11-29 NOTE — Plan of Care (Signed)
Problem: Phase I Progression Outcomes Goal: Flu/PneumoVaccines if indicated Outcome: Not Applicable Date Met:  11/29/12 Completed PTA Goal: Progress activity as tolerated unless otherwise ordered Outcome: Completed/Met Date Met:  11/29/12 Up ad lib with supervision

## 2012-11-29 NOTE — Consult Note (Signed)
Reason for Consult: Acute renal failure/hyperkalemia in a patient with CK D. 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 Has a baseline creatinine of around 1.4-1.8, and recently noted to be trending higher at about 1.9 with ongoing ACE inhibitor use. Reports sick contacts and exposure to extremes of weather over the last 2 weeks resulting in URI and later nausea/vomiting with significant reduction of oral intake over the past few days. He did continue to take his furosemide and lisinopril as prescribed. No interruptions noted in immunosuppressive therapy. Yesterday brought to the emergency room with altered mental status secondary to hypoglycemia following Lantus use.  Concern raised with elevated creatinine which on admission was 2.1 and is risen to 2.6 today in spite of intravenous fluids. No preceding nonsteroidal anti-inflammatory use/contrast exposure noted. His wife reports a diminished urine output and also remarks that he has had incontinence over the past 24 hours. No allograft site pain reported. No hematuria or hemoptysis noted.  Past Medical History  Diagnosis Date  . Diabetes mellitus   . Hypertension     Past Surgical History  Procedure Date  . Kidney transplant     No family history on file.  Social History:  reports that he has never smoked. He has never used smokeless tobacco. He reports that he does not drink alcohol or use illicit drugs.  Allergies:  Allergies  Allergen Reactions  . Iodine     Iv dye  . Shellfish Allergy Swelling    Has throat swelling, tongue swelling, and entire body swells up.     Medications:  Scheduled:   . albuterol  2.5 mg Nebulization Q6H  . amLODipine  5 mg Oral  Daily  . aspirin  81 mg Oral Daily  . atorvastatin  80 mg Oral q1800  . azithromycin  500 mg Intravenous Q24H  . cefTRIAXone (ROCEPHIN)  IV  1 g Intravenous Q24H  . gabapentin  300 mg Oral BID  . guaiFENesin  1,200 mg Oral BID  . heparin  5,000 Units Subcutaneous Q8H  . insulin aspart  0-9 Units Subcutaneous TID WC  . ipratropium  0.5 mg Nebulization Q6H  . isosorbide dinitrate  30 mg Oral Daily  . labetalol  300 mg Oral BID  . metoCLOPramide  10 mg Oral Daily  . mycophenolate  1,000 mg Oral BID  . pantoprazole  40 mg Oral Daily  . sodium chloride  3 mL Intravenous Q12H    Results for orders placed during the hospital encounter of 11/28/12 (from the past 48 hour(s))  GLUCOSE, CAPILLARY     Status: Normal   Collection Time   11/28/12  1:27 PM      Component Value Range Comment   Glucose-Capillary 70  70 - 99 mg/dL   GLUCOSE, CAPILLARY     Status: Abnormal   Collection Time   11/28/12  2:55 PM      Component Value Range Comment   Glucose-Capillary 62 (*) 70 - 99 mg/dL   CBC WITH DIFFERENTIAL     Status: Abnormal   Collection Time   11/28/12  3:05 PM      Component Value Range Comment   WBC 13.5 (*) 4.0 - 10.5 K/uL    RBC 3.75 (*) 4.22 - 5.81 MIL/uL  Hemoglobin 9.8 (*) 13.0 - 17.0 g/dL    HCT 16.1 (*) 09.6 - 52.0 %    MCV 84.8  78.0 - 100.0 fL    MCH 26.1  26.0 - 34.0 pg    MCHC 30.8  30.0 - 36.0 g/dL    RDW 04.5  40.9 - 81.1 %    Platelets 186  150 - 400 K/uL    Neutrophils Relative 87 (*) 43 - 77 %    Neutro Abs 11.8 (*) 1.7 - 7.7 K/uL    Lymphocytes Relative 3 (*) 12 - 46 %    Lymphs Abs 0.4 (*) 0.7 - 4.0 K/uL    Monocytes Relative 10  3 - 12 %    Monocytes Absolute 1.3 (*) 0.1 - 1.0 K/uL    Eosinophils Relative 0  0 - 5 %    Eosinophils Absolute 0.0  0.0 - 0.7 K/uL    Basophils Relative 0  0 - 1 %    Basophils Absolute 0.0  0.0 - 0.1 K/uL   COMPREHENSIVE METABOLIC PANEL     Status: Abnormal   Collection Time   11/28/12  3:05 PM      Component Value Range Comment     Sodium 137  135 - 145 mEq/L    Potassium 4.8  3.5 - 5.1 mEq/L    Chloride 106  96 - 112 mEq/L    CO2 18 (*) 19 - 32 mEq/L    Glucose, Bld 56 (*) 70 - 99 mg/dL    BUN 60 (*) 6 - 23 mg/dL    Creatinine, Ser 9.14 (*) 0.50 - 1.35 mg/dL    Calcium 9.1  8.4 - 78.2 mg/dL    Total Protein 7.0  6.0 - 8.3 g/dL    Albumin 3.8  3.5 - 5.2 g/dL    AST 12  0 - 37 U/L    ALT 7  0 - 53 U/L    Alkaline Phosphatase 43  39 - 117 U/L    Total Bilirubin 0.5  0.3 - 1.2 mg/dL    GFR calc non Af Amer 31 (*) >90 mL/min    GFR calc Af Amer 36 (*) >90 mL/min   LIPASE, BLOOD     Status: Normal   Collection Time   11/28/12  3:05 PM      Component Value Range Comment   Lipase 21  11 - 59 U/L   INFLUENZA PANEL BY PCR     Status: Normal   Collection Time   11/28/12  3:57 PM      Component Value Range Comment   Influenza A By PCR NEGATIVE  NEGATIVE    Influenza B By PCR NEGATIVE  NEGATIVE    H1N1 flu by pcr NOT DETECTED  NOT DETECTED   GLUCOSE, CAPILLARY     Status: Normal   Collection Time   11/28/12  4:39 PM      Component Value Range Comment   Glucose-Capillary 97  70 - 99 mg/dL   URINALYSIS, ROUTINE W REFLEX MICROSCOPIC     Status: Abnormal   Collection Time   11/28/12  4:45 PM      Component Value Range Comment   Color, Urine YELLOW  YELLOW    APPearance CLOUDY (*) CLEAR    Specific Gravity, Urine 1.021  1.005 - 1.030    pH 5.0  5.0 - 8.0    Glucose, UA NEGATIVE  NEGATIVE mg/dL    Hgb urine dipstick SMALL (*) NEGATIVE  Bilirubin Urine NEGATIVE  NEGATIVE    Ketones, ur NEGATIVE  NEGATIVE mg/dL    Protein, ur NEGATIVE  NEGATIVE mg/dL    Urobilinogen, UA 1.0  0.0 - 1.0 mg/dL    Nitrite NEGATIVE  NEGATIVE    Leukocytes, UA LARGE (*) NEGATIVE   URINE MICROSCOPIC-ADD ON     Status: Abnormal   Collection Time   11/28/12  4:45 PM      Component Value Range Comment   Squamous Epithelial / LPF RARE  RARE    WBC, UA 21-50  <3 WBC/hpf    RBC / HPF 0-2  <3 RBC/hpf    Bacteria, UA MANY (*) RARE     Casts GRANULAR CAST (*) NEGATIVE   LACTIC ACID, PLASMA     Status: Normal   Collection Time   11/28/12  5:25 PM      Component Value Range Comment   Lactic Acid, Venous 1.3  0.5 - 2.2 mmol/L   GLUCOSE, CAPILLARY     Status: Abnormal   Collection Time   11/28/12  6:44 PM      Component Value Range Comment   Glucose-Capillary 128 (*) 70 - 99 mg/dL   GLUCOSE, CAPILLARY     Status: Abnormal   Collection Time   11/28/12  9:10 PM      Component Value Range Comment   Glucose-Capillary 144 (*) 70 - 99 mg/dL    Comment 1 Notify RN      Comment 2 Documented in Chart     TSH     Status: Normal   Collection Time   11/28/12 11:26 PM      Component Value Range Comment   TSH 0.579  0.350 - 4.500 uIU/mL   GLUCOSE, CAPILLARY     Status: Abnormal   Collection Time   11/28/12 11:40 PM      Component Value Range Comment   Glucose-Capillary 118 (*) 70 - 99 mg/dL   GLUCOSE, CAPILLARY     Status: Abnormal   Collection Time   11/29/12  1:10 AM      Component Value Range Comment   Glucose-Capillary 121 (*) 70 - 99 mg/dL   GLUCOSE, CAPILLARY     Status: Abnormal   Collection Time   11/29/12  2:07 AM      Component Value Range Comment   Glucose-Capillary 117 (*) 70 - 99 mg/dL    Comment 1 Notify RN      Comment 2 Documented in Chart     CBC     Status: Abnormal   Collection Time   11/29/12  5:06 AM      Component Value Range Comment   WBC 14.5 (*) 4.0 - 10.5 K/uL    RBC 3.65 (*) 4.22 - 5.81 MIL/uL    Hemoglobin 9.6 (*) 13.0 - 17.0 g/dL    HCT 45.4 (*) 09.8 - 52.0 %    MCV 84.9  78.0 - 100.0 fL    MCH 26.3  26.0 - 34.0 pg    MCHC 31.0  30.0 - 36.0 g/dL    RDW 11.9  14.7 - 82.9 %    Platelets 159  150 - 400 K/uL   BASIC METABOLIC PANEL     Status: Abnormal   Collection Time   11/29/12  5:06 AM      Component Value Range Comment   Sodium 136  135 - 145 mEq/L    Potassium 5.4 (*) 3.5 - 5.1 mEq/L    Chloride 104  96 - 112 mEq/L    CO2 19  19 - 32 mEq/L    Glucose, Bld 87  70 - 99 mg/dL    BUN 66  (*) 6 - 23 mg/dL    Creatinine, Ser 1.61 (*) 0.50 - 1.35 mg/dL    Calcium 8.7  8.4 - 09.6 mg/dL    GFR calc non Af Amer 25 (*) >90 mL/min    GFR calc Af Amer 29 (*) >90 mL/min   MAGNESIUM     Status: Normal   Collection Time   11/29/12  5:06 AM      Component Value Range Comment   Magnesium 1.8  1.5 - 2.5 mg/dL   GLUCOSE, CAPILLARY     Status: Abnormal   Collection Time   11/29/12  6:23 AM      Component Value Range Comment   Glucose-Capillary 107 (*) 70 - 99 mg/dL   GLUCOSE, CAPILLARY     Status: Abnormal   Collection Time   11/29/12  7:57 AM      Component Value Range Comment   Glucose-Capillary 106 (*) 70 - 99 mg/dL     Dg Chest 2 View  0/45/4098  *RADIOLOGY REPORT*  Clinical Data: Weakness, shortness of breath, chest pain.  CHEST - 2 VIEW  Comparison: 09/28/2012  Findings: Moderate cardiomegaly, increased since prior study. Atheromatous aorta.  No focal infiltrate or effusion.  Some increase in central pulmonary vascular congestion.  Spurring in the lower thoracic spine.  IMPRESSION:  New cardiomegaly and central pulmonary vascular congestion.   Original Report Authenticated By: D. Andria Rhein, MD     Review of Systems  Constitutional: Positive for fever, chills and malaise/fatigue. Negative for weight loss and diaphoresis.  HENT: Positive for congestion. Negative for hearing loss, ear pain, nosebleeds, sore throat, neck pain, tinnitus and ear discharge.   Eyes: Negative.   Respiratory: Positive for cough and sputum production. Negative for hemoptysis, shortness of breath, wheezing and stridor.   Cardiovascular: Negative.   Gastrointestinal: Positive for nausea and vomiting. Negative for abdominal pain, diarrhea, constipation, blood in stool and melena.  Genitourinary: Negative.   Musculoskeletal: Positive for myalgias. Negative for back pain, joint pain and falls.  Skin: Negative.   Neurological: Positive for weakness. Negative for dizziness, tingling, tremors, sensory change,  speech change, focal weakness, seizures, loss of consciousness and headaches.  Endo/Heme/Allergies: Negative.   Psychiatric/Behavioral: Negative.    Blood pressure 114/63, pulse 77, temperature 101.9 F (38.8 C), temperature source Oral, resp. rate 22, height 6\' 2"  (1.88 m), weight 126.5 kg (278 lb 14.1 oz), SpO2 95.00%. Physical Exam  Nursing note and vitals reviewed. Constitutional: He is oriented to person, place, and time. He appears well-developed. No distress.       Somnolent but engaging  HENT:  Head: Normocephalic and atraumatic.  Nose: Nose normal.  Mouth/Throat: No oropharyngeal exudate.  Eyes: Conjunctivae normal are normal. Pupils are equal, round, and reactive to light. No scleral icterus.  Neck: Normal range of motion. Neck supple. No JVD present. No tracheal deviation present. No thyromegaly present.  Cardiovascular: Normal rate, regular rhythm and intact distal pulses.  Exam reveals no gallop and no friction rub.   No murmur heard. Respiratory: Effort normal. He has wheezes. He has no rales. He exhibits no tenderness.       Coarse Breath sounds with rare expiratory wheeze  GI: Soft. Bowel sounds are normal. He exhibits no distension and no mass. There is tenderness. There is no rebound and no  guarding.       Mild epigastric tenderness  Musculoskeletal: Normal range of motion. He exhibits edema. He exhibits no tenderness.       Trace LE edema  Lymphadenopathy:    He has no cervical adenopathy.  Neurological: He is alert and oriented to person, place, and time. No cranial nerve deficit.  Skin: Skin is warm and dry. No rash noted. He is not diaphoretic. No erythema.  Psychiatric: He has a normal mood and affect. His behavior is normal.    Assessment/Plan: 1. Acute renal failure on chronic kidney disease stage III T. 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 I agree with the current plan of holding these 2 medications as we attempt fluid resuscitation. I will also check urine electrolytes and request for a renal ultrasound in order to rule out any structural allograft problems. His immunosuppression will be continued at his usual outpatient doses-cyclosporine 150 mg by mouth twice a day, CellCept 1 g by mouth twice a day and prednisone 5 mg by mouth daily as his respiratory illness/infection does not appear to be overwhelming. 2. Acute Bronchitis: Clinically doing better on intravenous ceftriaxone/azithromycin as well as bronchodilators. Fever curve improving. 3. Hypertension: Discontinue lisinopril and furosemide at this time while he is being followed resuscitated. We'll continue to monitor blood pressures closely for intermittent dosing with when necessary hydralazine/beta blockers 4. Hyperkalemia: Likely secondary to acute renal failure, continue to monitor particularly while on cyclosporine. Anticipate will improve with improving urine output/renal function. Will use Kayexalate if potassium is greater than 5.5. 5. Anemia: Probably chronic from underlying CK D.-will check iron stores.  Derriona Branscom K. 11/29/2012, 11:57 AM

## 2012-11-29 NOTE — Progress Notes (Signed)
Pharmacy Consult - Zosyn  For UTI CrCl > 20 ml/min  Plan: 1) Zosyn 3.375 grams iv Q 8 hours - 4 hr infusion 2) Pharmacy will sign off.  Thank you. Okey Regal, PharmD

## 2012-11-29 NOTE — Progress Notes (Addendum)
Addendum  Patient seen and examined, chart and data base reviewed.  I agree with the above assessment and plan.  For full details please see Mrs. Melbourne Abts NP note.  Acute bronchitis, continue current antibiotics, blood cultures NGTD. Nausea and vomiting improving.  Acute on chronic kidney disease nephrology to evaluate.  Blood culture showed GNR, likely from UTI, On Rocephin, I will broaden the coverage to Zosyn b/c imuunosupressents.    Clint Lipps, MD [facility] Pager: [phone] 11/29/2012, 12:13 PM

## 2012-11-29 NOTE — ED Provider Notes (Signed)
Medical screening examination/treatment/procedure(s) were performed by non-physician practitioner and as supervising physician I was immediately available for consultation/collaboration.   Dilynn Munroe M Jakim Drapeau, DO 11/29/12 1129 

## 2012-11-29 NOTE — Progress Notes (Signed)
[facility] PROGRESS NOTE  [name] ZHY:[id number] DOB: 08/29/1951 DOA: 11/28/2012 PCP: Leanor Rubenstein, MD  Assessment/Plan: Nausea and vomiting  -somewhat improved. No vomiting and less nausea.  -Likely transient viral gastroenteritis, patient has contact was his sick grandson.  - Continue supportive management with antiemetics, and hydration with IV fluids.  -Advance his diet as tolerated. Not eating much this am Acute bronchitis  - continue azithromycin and Rocephin day #2.   -X-ray showed bronchitic changes, he has upper respiratory tract infection symptoms. -Continues with  fever, flu PCR negative will discontinue tamiflu. - continue with supportive management with inhaled bronchodilators, mucolytics, antitussives and oxygen.  Hypoglycemia and diabetes mellitus type 2  -This is likely secondary to the insulin and the poor oral intake.  -Patient was given IV D5W and the ambulance on route to the hospital.   -Improved CBG range 107-114. Still not taking po consistently.   -Carbohydrate modified diet, insulin sliding scale. check hemoglobin A1c pending.  Status post kidney transplant/CKD  -Patient baseline creatinine is about 2.0, he has higher BUN than usual.  -trending upward this am.  -Because of nausea and vomiting Lasix on hold. Continue to  hydrate gently with IV fluids.  --Patient is on CellCept, continue. He is not on prednisone.  -Dr. Allena Katz with nephrology will consult  Hyperkalemia -mild -likely related to #4. -monitor request renal consult given renal hx   Code Status: full Family Communication:  Disposition Plan: home when ready   Consultants:  nephrology  Procedures:  none  Antibiotics: Azithromycin 11/28/12 Rocephin 11/28/12 HPI/Subjective: Lying in bed eyes closed. Arouses to verbal stimuli but doses when not stimulated. Denies pain/discomfort. States feeling "a little better"  Objective: Filed Vitals:   11/29/12 0543 11/29/12 0627  11/29/12 0629 11/29/12 0815  BP:      Pulse:      Temp: 102.3 F (39.1 C) 101.9 F (38.8 C)    TempSrc: Oral Oral    Resp:      Height:      Weight:   126.5 kg (278 lb 14.1 oz)   SpO2:    95%    Intake/Output Summary (Last 24 hours) at 11/29/12 0848 Last data filed at 11/29/12 0429  Gross per 24 hour  Intake 486.75 ml  Output    201 ml  Net 285.75 ml   Filed Weights   11/29/12 0629  Weight: 126.5 kg (278 lb 14.1 oz)    Exam:   General:  Drowsy, well nourished NAD  Cardiovascular: RRR No MGR No LEE PPP  Respiratory: normal effort. BSCTAB No wheeze/rhonchi  Abdomen: obese soft +BS non-tender to palpation  Data Reviewed: Basic Metabolic Panel:  Lab 11/29/12 2952 11/28/12 1505  NA 136 137  K 5.4* 4.8  CL 104 106  CO2 19 18*  GLUCOSE 87 56*  BUN 66* 60*  CREATININE 2.60* 2.17*  CALCIUM 8.7 9.1  MG 1.8 --  PHOS -- --   Liver Function Tests:  Lab 11/28/12 1505  AST 12  ALT 7  ALKPHOS 43  BILITOT 0.5  PROT 7.0  ALBUMIN 3.8    Lab 11/28/12 1505  LIPASE 21  AMYLASE --   No results found for this basename: AMMONIA:5 in the last 168 hours CBC:  Lab 11/29/12 0506 11/28/12 1505  WBC 14.5* 13.5*  NEUTROABS -- 11.8*  HGB 9.6* 9.8*  HCT 31.0* 31.8*  MCV 84.9 84.8  PLT 159 186   Cardiac Enzymes: No results found for this basename: CKTOTAL:5,CKMB:5,CKMBINDEX:5,TROPONINI:5 in the  last 168 hours BNP (last 3 results) No results found for this basename: PROBNP:3 in the last 8760 hours CBG:  Lab 11/29/12 0757 11/29/12 0623 11/29/12 0207 11/29/12 0110 11/28/12 2340  GLUCAP 106* 107* 117* 121* 118*    No results found for this or any previous visit (from the past 240 hour(s)).   Studies: Dg Chest 2 View  11/28/2012  *RADIOLOGY REPORT*  Clinical Data: Weakness, shortness of breath, chest pain.  CHEST - 2 VIEW  Comparison: 09/28/2012  Findings: Moderate cardiomegaly, increased since prior study. Atheromatous aorta.  No focal infiltrate or effusion.  Some  increase in central pulmonary vascular congestion.  Spurring in the lower thoracic spine.  IMPRESSION:  New cardiomegaly and central pulmonary vascular congestion.   Original Report Authenticated By: D. Deanne Coffer III, MD     Scheduled Meds:   . albuterol  2.5 mg Nebulization Q6H  . amLODipine  5 mg Oral Daily  . aspirin  81 mg Oral Daily  . atorvastatin  80 mg Oral q1800  . azithromycin  500 mg Intravenous Q24H  . cefTRIAXone (ROCEPHIN)  IV  1 g Intravenous Q24H  . gabapentin  300 mg Oral BID  . guaiFENesin  1,200 mg Oral BID  . heparin  5,000 Units Subcutaneous Q8H  . insulin aspart  0-9 Units Subcutaneous TID WC  . ipratropium  0.5 mg Nebulization Q6H  . isosorbide dinitrate  30 mg Oral Daily  . labetalol  300 mg Oral BID  . metoCLOPramide  10 mg Oral Daily  . mycophenolate  1,000 mg Oral BID  . oseltamivir  75 mg Oral BID  . pantoprazole  40 mg Oral Daily  . sodium chloride  3 mL Intravenous Q12H   Continuous Infusions:   . sodium chloride 1,000 mL (11/29/12 0733)    Principal Problem:  *Acute bronchitis Active Problems:  DM 2  DIABETIC PERIPHERAL NEUROPATHY  History of renal transplant  Hypoglycemia  Nausea and vomiting  Slurred speech    Time spent: 30 minutes    [facility] M  [facility]  If 8PM-8AM, please contact night-coverage at [email or web address], password [email or web address] 11/29/2012, 8:48 AM  LOS: 1 day

## 2012-11-30 DIAGNOSIS — B9689 Other specified bacterial agents as the cause of diseases classified elsewhere: Secondary | ICD-10-CM

## 2012-11-30 DIAGNOSIS — N179 Acute kidney failure, unspecified: Secondary | ICD-10-CM

## 2012-11-30 DIAGNOSIS — E1149 Type 2 diabetes mellitus with other diabetic neurological complication: Secondary | ICD-10-CM

## 2012-11-30 DIAGNOSIS — R7881 Bacteremia: Secondary | ICD-10-CM

## 2012-11-30 LAB — CBC
Hemoglobin: 8.7 g/dL — ABNORMAL LOW (ref 13.0–17.0)
MCV: 83.2 fL (ref 78.0–100.0)
Platelets: 139 10*3/uL — ABNORMAL LOW (ref 150–400)
RBC: 3.21 MIL/uL — ABNORMAL LOW (ref 4.22–5.81)
WBC: 10.3 10*3/uL (ref 4.0–10.5)

## 2012-11-30 LAB — GLUCOSE, CAPILLARY
Glucose-Capillary: 105 mg/dL — ABNORMAL HIGH (ref 70–99)
Glucose-Capillary: 128 mg/dL — ABNORMAL HIGH (ref 70–99)
Glucose-Capillary: 133 mg/dL — ABNORMAL HIGH (ref 70–99)

## 2012-11-30 LAB — BASIC METABOLIC PANEL
CO2: 17 mEq/L — ABNORMAL LOW (ref 19–32)
Calcium: 8.3 mg/dL — ABNORMAL LOW (ref 8.4–10.5)
Chloride: 101 mEq/L (ref 96–112)
Creatinine, Ser: 3.21 mg/dL — ABNORMAL HIGH (ref 0.50–1.35)
GFR calc non Af Amer: 20 mL/min — ABNORMAL LOW (ref >90)
Sodium: 133 mEq/L — ABNORMAL LOW (ref 135–145)
Sodium: 130 mEq/L — ABNORMAL LOW (ref 135–145)

## 2012-11-30 LAB — URINE CULTURE

## 2012-11-30 LAB — UREA NITROGEN, URINE: Urea Nitrogen, Ur: 527 mg/dL

## 2012-11-30 MED ORDER — PRAVASTATIN SODIUM 40 MG PO TABS
40.0000 mg | ORAL_TABLET | Freq: Every day | ORAL | Status: DC
  Administered 2012-11-30 – 2012-12-03 (×4): 40 mg via ORAL
  Filled 2012-11-30 (×5): qty 1

## 2012-11-30 MED ORDER — SODIUM BICARBONATE 650 MG PO TABS
650.0000 mg | ORAL_TABLET | Freq: Two times a day (BID) | ORAL | Status: DC
  Administered 2012-11-30 – 2012-12-01 (×4): 650 mg via ORAL
  Filled 2012-11-30 (×6): qty 1

## 2012-11-30 MED ORDER — SODIUM CHLORIDE 0.9 % IV SOLN
1020.0000 mg | Freq: Once | INTRAVENOUS | Status: AC
  Administered 2012-11-30: 1020 mg via INTRAVENOUS
  Filled 2012-11-30: qty 34

## 2012-11-30 NOTE — Progress Notes (Signed)
Pt had 5 runs of V tach. Paging doctor now.

## 2012-11-30 NOTE — Progress Notes (Signed)
Pt has fever of 101.2. RN will give PRN tylenol and will contact the doctor if pt does not improve. Will continue to monitor pt.

## 2012-11-30 NOTE — Evaluation (Signed)
Physical Therapy Evaluation Patient Details Name: [name] MRN: [id number] DOB: [birth year] Today's Date: 11/30/2012 Time: 0865-7846 PT Time Calculation (min): 20 min  PT Assessment / Plan / Recommendation Clinical Impression  Pt adm with bacteremia and acute on chronic renal failure. Pt with hx of renal transplant.  Currently pt fatigues very quickly and dyspneic with minimal exertion.  Wife supportive and can assist at home.  Expect pt will progress enough that he will be able to return home with wife's assist.    PT Assessment  Patient needs continued PT services    Follow Up Recommendations  Home health PT;Supervision/Assistance - 24 hour    Does the patient have the potential to tolerate intense rehabilitation      Barriers to Discharge        Equipment Recommendations  None recommended by PT    Recommendations for Other Services OT consult   Frequency Min 3X/week    Precautions / Restrictions Precautions Precautions: Fall   Pertinent Vitals/Pain Dyspnea 3/4 with standing.      Mobility  Bed Mobility Bed Mobility: Supine to Sit;Sitting - Scoot to Edge of Bed;Sit to Supine Supine to Sit: 4: Min assist;HOB elevated;With rails Sitting - Scoot to Edge of Bed: 4: Min assist;With rail Sit to Supine: 4: Min assist Details for Bed Mobility Assistance: verbal/tactile cues to initiate Transfers Transfers: Sit to Stand;Stand to Sit Sit to Stand: 1: +2 Total assist;With upper extremity assist;From bed Sit to Stand: Patient Percentage: 80% Stand to Sit: 1: +2 Total assist;With upper extremity assist;To bed Details for Transfer Assistance: verbal cues for hand placement. Repeated 3 times to clean pt after BM Ambulation/Gait Ambulation/Gait Assistance: 1: +2 Total assist Ambulation/Gait: Patient Percentage: 80% Ambulation Distance (Feet): 2 Feet (2' forward and 2' backward) Assistive device: Rolling walker Ambulation/Gait Assistance Details: verbal cues to stand  more erect Gait Pattern: Step-to pattern;Decreased step length - right;Decreased step length - left;Shuffle;Trunk flexed Gait velocity: slow    Shoulder Instructions     Exercises     PT Diagnosis: Difficulty walking;Generalized weakness  PT Problem List: Decreased strength;Decreased activity tolerance;Decreased balance;Decreased mobility;Decreased knowledge of use of DME PT Treatment Interventions: DME instruction;Gait training;Functional mobility training;Patient/family education;Therapeutic activities;Therapeutic exercise;Balance training   PT Goals Acute Rehab PT Goals PT Goal Formulation: With patient Time For Goal Achievement: 12/14/12 Potential to Achieve Goals: Good Pt will go Supine/Side to Sit: with supervision PT Goal: Supine/Side to Sit - Progress: Goal set today Pt will go Sit to Supine/Side: with supervision PT Goal: Sit to Supine/Side - Progress: Goal set today Pt will go Sit to Stand: with supervision PT Goal: Sit to Stand - Progress: Goal set today Pt will go Stand to Sit: with supervision PT Goal: Stand to Sit - Progress: Goal set today Pt will Ambulate: 51 - 150 feet;with supervision;with least restrictive assistive device PT Goal: Ambulate - Progress: Goal set today  Visit Information  Last PT Received On: 11/30/12 Assistance Needed: +2    Subjective Data  Subjective: Pt denies pain but does acknowledge fatigue. Patient Stated Goal: Return home   Prior Functioning  Home Living Lives With: Spouse Available Help at Discharge: Family;Available 24 hours/day Type of Home: House Home Access: Level entry Home Layout: One level Bathroom Shower/Tub: Walk-in shower Home Adaptive Equipment: Walker - rolling;Other (comment) (uses wheeled office chair to push around at times) Prior Function Level of Independence: Independent Vocation: On disability Communication Communication: No difficulties    Cognition  Overall Cognitive Status:  Impaired Arousal/Alertness:  Lethargic Behavior During Session: Lethargic Cognition - Other Comments: slow to respond    Extremity/Trunk Assessment Right Lower Extremity Assessment RLE ROM/Strength/Tone: Deficits RLE ROM/Strength/Tone Deficits: grossly 4-/5 Left Lower Extremity Assessment LLE ROM/Strength/Tone: Deficits LLE ROM/Strength/Tone Deficits: grossly 4-/5   Balance Static Standing Balance Static Standing - Balance Support: Bilateral upper extremity supported (on walker) Static Standing - Level of Assistance: 4: Min assist  End of Session PT - End of Session Equipment Utilized During Treatment: Gait belt Activity Tolerance: Patient limited by fatigue Patient left: in bed;with call bell/phone within reach;with family/visitor present Nurse Communication: Mobility status  GP     [name] 11/30/2012, 3:48 PM  [employer] PT [phone]

## 2012-11-30 NOTE — Progress Notes (Signed)
Pt having fever and chills- contacted doctor.

## 2012-11-30 NOTE — Progress Notes (Signed)
Patient ID: [name], male   DOB: April 25, 1951, 62 y.o.   MRN: [id number]   [facility] Progress Note    Subjective:   Reports to be feeling a little better today- denies any chest pain/shortness of breath. Wife reports mental status is at baseline   Objective:   BP 113/47  Pulse 77  Temp 98.7 F (37.1 C) (Oral)  Resp 32  Ht 6\' 2"  (1.88 m)  Wt 126.5 kg (278 lb 14.1 oz)  BMI 35.81 kg/m2  SpO2 98%  Intake/Output Summary (Last 24 hours) at 11/30/12 0834 Last data filed at 11/30/12 0600  Gross per 24 hour  Intake   1100 ml  Output    650 ml  Net    450 ml   Weight change:   Physical Exam: Gen: Comfortably resting in bed, wife by bedside CVS: Pulse regular in rate and rhythm, heart sounds S1 and S2 normal Resp: Clear to auscultation bilaterally with rare expiratory wheeze. No rales.  Abd: Soft, obese, nontender-no allograft site tenderness Ext: Trace lower extremity edema  Imaging: Dg Chest 2 View  11/28/2012  *RADIOLOGY REPORT*  Clinical Data: Weakness, shortness of breath, chest pain.  CHEST - 2 VIEW  Comparison: 09/28/2012  Findings: Moderate cardiomegaly, increased since prior study. Atheromatous aorta.  No focal infiltrate or effusion.  Some increase in central pulmonary vascular congestion.  Spurring in the lower thoracic spine.  IMPRESSION:  New cardiomegaly and central pulmonary vascular congestion.   Original Report Authenticated By: D. Andria Rhein, MD    US Renal  11/29/2012  *RADIOLOGY REPORT*  Clinical Data: Acute renal failure on chronic renal disease.  RENAL/URINARY TRACT ULTRASOUND COMPLETE  Comparison:  None.  Findings:  Right Kidney:  9.9 cm in length.  Renal cortical thinning and increased echogenicity consistent with chronic medical renal disease.  No hydronephrosis or mass.  Left Kidney:  9.8 cm in length.  Renal cortical thinning and increased echogenicity consistent with chronic medical renal disease.  No hydronephrosis or mass.  Right  lower quadrant transplant kidney:  12.6 cm in length.  Normal renal cortical thickness and echogenicity without focal lesions or hydronephrosis.  No perinephric fluid collection.  Bladder:  Normal  IMPRESSION:  1.  Small echogenic native renal kidneys consistent with chronic medical renal disease. 2.  Right lower quadrant transplant kidney is normal.   Original Report Authenticated By: Rudie Meyer, M.D.     Labs: BMET  Lab 11/30/12 7829 11/29/12 1917 11/29/12 0506 11/28/12 1505  NA 133* 135 136 137  K 5.0 4.8 5.4* 4.8  CL 101 107 104 106  CO2 17* 17* 19 18*  GLUCOSE 96 128* 87 56*  BUN 73* 62* 66* 60*  CREATININE 3.21* 2.68* 2.60* 2.17*  ALB -- -- -- --  CALCIUM 8.4 6.9* 8.7 9.1  PHOS -- -- -- --   CBC  Lab 11/30/12 0634 11/29/12 0506 11/28/12 1505  WBC 10.3 14.5* 13.5*  NEUTROABS -- -- 11.8*  HGB 8.7* 9.6* 9.8*  HCT 26.7* 31.0* 31.8*  MCV 83.2 84.9 84.8  PLT 139* 159 186    Medications:      . albuterol  2.5 mg Nebulization Q6H  . amLODipine  5 mg Oral Daily  . aspirin  81 mg Oral Daily  . atorvastatin  80 mg Oral q1800  . azithromycin  500 mg Intravenous Q24H  . cycloSPORINE modified  150 mg Oral BID  . gabapentin  300 mg Oral BID  . guaiFENesin  1,200  mg Oral BID  . heparin  5,000 Units Subcutaneous Q8H  . insulin aspart  0-9 Units Subcutaneous TID WC  . ipratropium  0.5 mg Nebulization Q6H  . isosorbide dinitrate  30 mg Oral Daily  . labetalol  300 mg Oral BID  . metoCLOPramide  10 mg Oral Daily  . mycophenolate  1,000 mg Oral BID  . pantoprazole  40 mg Oral Daily  . piperacillin-tazobactam (ZOSYN)  IV  3.375 g Intravenous Q8H  . predniSONE  5 mg Oral Q breakfast  . sodium chloride  3 mL Intravenous Q12H     Assessment/ Plan:   1. Acute renal failure on chronic kidney disease stage III T. 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 Given the presence of granular casts in his urine sediment-I suspect that he may have evolving to ATN given the clinical scenario. Although the risk of rejection appears to be low, if creatinine does not improve in the next 24-48 hours, we'll undertake an allograft biopsy. Will continue to monitor twice a day labs for any emerging electrolyte abnormalities. We'll start transiently on oral sodium bicarbonate for correction of metabolic acidosis. Continue antirejection therapy at current doses. 2. Acute Bronchitis: Clinically doing better on intravenous ceftriaxone/azithromycin as well as bronchodilators. The fever trend as well as leukocytosis improving on current antibiotics.  3. Hypertension: Discontinue lisinopril and furosemide at this time while he is being followed resuscitated. We'll continue to monitor blood pressures closely for intermittent dosing with when necessary hydralazine/beta blockers  4. Hyperkalemia: Likely secondary to acute renal failure, continue to monitor particularly while on cyclosporine. Anticipate will improve with improving urine output/renal function. Will use Kayexalate if potassium is greater than 5.5.  5. Anemia: Replace iron stores-start ESA in 24-48 hours.   Zetta Bills, MD 11/30/2012, 8:34 AM

## 2012-11-30 NOTE — Progress Notes (Signed)
Patient evaluated for community based chronic disease management services with [facility] Care Management Program as a benefit of patient's [employer].  Spoke with patient and wife at bedside to explain [facility] Care Management services.  Patient became moderately SOB during the consultation and was unable to continue the discussion.  Respiratory and nursing support requested at bedside.  Left contact information and [facility] literature with wife.  Informed wife that Clinical research associate will continue to monitor for disposition and follow up with them at a later date.  Made inpatient Case Manager aware that [facility] Care Management following. Of note, [facility] Care Management services does not replace or interfere with any services that are arranged by inpatient case management or social work.  For additional questions or referrals please contact Anibal Henderson BSN RN [facility] Liaison at [phone].

## 2012-11-30 NOTE — Progress Notes (Signed)
Addendum  Patient seen and examined, chart and data base reviewed.  I agree with the above assessment and plan.  For full details please see Mrs. Melbourne Abts NP note.  Acute bronchitis, continue current antibiotics, blood cultures NGTD. Nausea and vomiting improving.  Acute on CKD, appreciate Dr. Eliane Decree, consider renal biopsy if creatinine does not improve. GNR bacteremia, and Zosyn. Identification and susceptibility pending.  Clint Lipps Pager: [phone] 11/30/2012, 2:31 PM

## 2012-11-30 NOTE — Progress Notes (Signed)
[facility] PROGRESS NOTE  [name] WUJ:[id number] DOB: 1951/11/08 DOA: 11/28/2012 PCP: Leanor Rubenstein, MD  Assessment/Plan: Nausea and vomiting  - No vomiting and less nausea.  -Blood culture showed GNR likely from UTI. Rocephin discontinued and zosyn for broader coverage initiated 11/29/12 given immunosupresents  -fever improved since zosyn.   - Continue supportive management with antiemetics, and hydration with IV fluids.  -Advance his diet as tolerated. Still poor appetite Acute bronchitis  -antibiotic changed from rocephin to zosyn 11/29/12 .  -X-ray showed bronchitic changes, he has upper respiratory tract infection symptoms.  -Continues with fever but lower 99.3, flu PCR negative..  - continue with supportive management with inhaled bronchodilators, mucolytics, antitussives and oxygen.  -sats >90% on 2L Hypoglycemia and diabetes mellitus type 2  -This is likely secondary to the insulin and the poor oral intake.  -Patient was given IV D5W and the ambulance on route to the hospital.  -Improved CBG range 10-154. Still not taking po consistently.  -Carbohydrate modified diet, insulin sliding scale. check hemoglobin A1c 7.2  Status post kidney transplant/CKD  -Patient baseline creatinine is about 2.0, he has higher BUN than usual.  - continues to trend upward this am.  -Because of nausea and vomiting Lasix on hold. Continue to hydrate gently with IV fluids.  -appreciate renal assistance --Patient is on CellCept, continue. He is not on prednisone.  -urine output good  Hyperkalemia  -resolved this am -likely related to #4.  -apprecieate renal consult given renal hx   Code Status: full Family Communication: wife at bedside Disposition Plan: home when medically ready   Consultants:  renal  Procedures:  none  Antibiotics:  rocephin (11/28/12-11/29/12  azythromycin 11/28/12-13/14  Zosyn 11/29/12>>>  HPI/Subjective: Lying in bed eyes closed respers even skin  warm. Arouses to verbal stimuli  Objective: Filed Vitals:   11/30/12 0005 11/30/12 0157 11/30/12 0211 11/30/12 0408  BP: 136/50   113/47  Pulse: 71   77  Temp: 99.1 F (37.3 C)  99.1 F (37.3 C) 98.7 F (37.1 C)  TempSrc: Oral  Oral Oral  Resp: 28   32  Height:      Weight:      SpO2: 96% 97%  98%    Intake/Output Summary (Last 24 hours) at 11/30/12 0808 Last data filed at 11/30/12 0600  Gross per 24 hour  Intake   1100 ml  Output    650 ml  Net    450 ml   Filed Weights   11/29/12 0629  Weight: 126.5 kg (278 lb 14.1 oz)    Exam:   General:  Well nourished, drowsy NAD  Cardiovascular: RRR No MGR No LEE  Respiratory: mild increased work of breathing. BS coarse no crackles  Abdomen: obese soft +BS   Data Reviewed: Basic Metabolic Panel:  Lab 11/30/12 9562 11/29/12 1917 11/29/12 0506 11/28/12 1505  NA 133* 135 136 137  K 5.0 4.8 5.4* 4.8  CL 101 107 104 106  CO2 17* 17* 19 18*  GLUCOSE 96 128* 87 56*  BUN 73* 62* 66* 60*  CREATININE 3.21* 2.68* 2.60* 2.17*  CALCIUM 8.4 6.9* 8.7 9.1  MG -- -- 1.8 --  PHOS -- -- -- --   Liver Function Tests:  Lab 11/28/12 1505  AST 12  ALT 7  ALKPHOS 43  BILITOT 0.5  PROT 7.0  ALBUMIN 3.8    Lab 11/28/12 1505  LIPASE 21  AMYLASE --   No results found for this basename: AMMONIA:5 in the  last 168 hours CBC:  Lab 11/30/12 0634 11/29/12 0506 11/28/12 1505  WBC 10.3 14.5* 13.5*  NEUTROABS -- -- 11.8*  HGB 8.7* 9.6* 9.8*  HCT 26.7* 31.0* 31.8*  MCV 83.2 84.9 84.8  PLT 139* 159 186   Cardiac Enzymes: No results found for this basename: CKTOTAL:5,CKMB:5,CKMBINDEX:5,TROPONINI:5 in the last 168 hours BNP (last 3 results) No results found for this basename: PROBNP:3 in the last 8760 hours CBG:  Lab 11/29/12 2117 11/29/12 1635 11/29/12 1215 11/29/12 0757 11/29/12 0623  GLUCAP 159* 135* 102* 106* 107*    Recent Results (from the past 240 hour(s))  URINE CULTURE     Status: Normal (Preliminary result)    Collection Time   11/28/12  4:45 PM      Component Value Range Status Comment   Specimen Description URINE, CLEAN CATCH   Final    Special Requests NONE   Final    Culture  Setup Time 11/28/2012 22:15   Final    Colony Count >=100,000 COLONIES/ML   Final    Culture GRAM NEGATIVE RODS   Final    Report Status PENDING   Incomplete   CULTURE, BLOOD (ROUTINE X 2)     Status: Normal (Preliminary result)   Collection Time   11/28/12  5:30 PM      Component Value Range Status Comment   Specimen Description BLOOD RIGHT HAND   Final    Special Requests BOTTLES DRAWN AEROBIC AND ANAEROBIC 10CC   Final    Culture  Setup Time 11/29/2012 02:16   Final    Culture     Final    Value: GRAM NEGATIVE RODS     Note: Gram Stain Report Called to,Read Back By and Verified With: Rosalie Doctor RN @ 1243 11/29/12 BY KRAWS   Report Status PENDING   Incomplete   CULTURE, BLOOD (ROUTINE X 2)     Status: Normal (Preliminary result)   Collection Time   11/28/12  6:00 PM      Component Value Range Status Comment   Specimen Description BLOOD RIGHT ARM   Final    Special Requests BOTTLES DRAWN AEROBIC AND ANAEROBIC 10CC   Final    Culture  Setup Time 11/29/2012 02:16   Final    Culture     Final    Value: GRAM NEGATIVE RODS     Note: Gram Stain Report Called to,Read Back By and Verified With: Rosalie Doctor RN @ [phone] 11/29/12 BY KRAWS   Report Status PENDING   Incomplete      Studies: Dg Chest 2 View  11/28/2012  *RADIOLOGY REPORT*  Clinical Data: Weakness, shortness of breath, chest pain.  CHEST - 2 VIEW  Comparison: 09/28/2012  Findings: Moderate cardiomegaly, increased since prior study. Atheromatous aorta.  No focal infiltrate or effusion.  Some increase in central pulmonary vascular congestion.  Spurring in the lower thoracic spine.  IMPRESSION:  New cardiomegaly and central pulmonary vascular congestion.   Original Report Authenticated By: D. Andria Rhein, MD    US Renal  11/29/2012  *RADIOLOGY REPORT*  Clinical  Data: Acute renal failure on chronic renal disease.  RENAL/URINARY TRACT ULTRASOUND COMPLETE  Comparison:  None.  Findings:  Right Kidney:  9.9 cm in length.  Renal cortical thinning and increased echogenicity consistent with chronic medical renal disease.  No hydronephrosis or mass.  Left Kidney:  9.8 cm in length.  Renal cortical thinning and increased echogenicity consistent with chronic medical renal disease.  No hydronephrosis or mass.  Right lower quadrant transplant kidney:  12.6 cm in length.  Normal renal cortical thickness and echogenicity without focal lesions or hydronephrosis.  No perinephric fluid collection.  Bladder:  Normal  IMPRESSION:  1.  Small echogenic native renal kidneys consistent with chronic medical renal disease. 2.  Right lower quadrant transplant kidney is normal.   Original Report Authenticated By: Rudie Meyer, M.D.     Scheduled Meds:   . albuterol  2.5 mg Nebulization Q6H  . amLODipine  5 mg Oral Daily  . aspirin  81 mg Oral Daily  . atorvastatin  80 mg Oral q1800  . azithromycin  500 mg Intravenous Q24H  . cycloSPORINE modified  150 mg Oral BID  . gabapentin  300 mg Oral BID  . guaiFENesin  1,200 mg Oral BID  . heparin  5,000 Units Subcutaneous Q8H  . insulin aspart  0-9 Units Subcutaneous TID WC  . ipratropium  0.5 mg Nebulization Q6H  . isosorbide dinitrate  30 mg Oral Daily  . labetalol  300 mg Oral BID  . metoCLOPramide  10 mg Oral Daily  . mycophenolate  1,000 mg Oral BID  . pantoprazole  40 mg Oral Daily  . piperacillin-tazobactam (ZOSYN)  IV  3.375 g Intravenous Q8H  . predniSONE  5 mg Oral Q breakfast  . sodium chloride  3 mL Intravenous Q12H   Continuous Infusions:   . sodium chloride 100 mL/hr at 11/30/12 0530    Principal Problem:  *Acute bronchitis Active Problems:  DM 2  DIABETIC PERIPHERAL NEUROPATHY  History of renal transplant  Hypoglycemia  Nausea and vomiting  Slurred speech    Time spent: 30 minutes    [facility]  M  [facility]  If 8PM-8AM, please contact night-coverage at [email or web address], password [facility] 11/30/2012, 8:08 AM  LOS: 2 days

## 2012-12-01 ENCOUNTER — Inpatient Hospital Stay (HOSPITAL_COMMUNITY): Payer: Medicare HMO

## 2012-12-01 DIAGNOSIS — I517 Cardiomegaly: Secondary | ICD-10-CM

## 2012-12-01 LAB — BASIC METABOLIC PANEL
BUN: 82 mg/dL — ABNORMAL HIGH (ref 6–23)
Creatinine, Ser: 3.29 mg/dL — ABNORMAL HIGH (ref 0.50–1.35)
GFR calc Af Amer: 22 mL/min — ABNORMAL LOW (ref >90)
GFR calc non Af Amer: 19 mL/min — ABNORMAL LOW (ref >90)
Glucose, Bld: 117 mg/dL — ABNORMAL HIGH (ref 70–99)
Potassium: 4.9 mEq/L (ref 3.5–5.1)

## 2012-12-01 LAB — CULTURE, BLOOD (ROUTINE X 2)

## 2012-12-01 LAB — CBC
HCT: 27.5 % — ABNORMAL LOW (ref 39.0–52.0)
Hemoglobin: 9 g/dL — ABNORMAL LOW (ref 13.0–17.0)
MCHC: 32.7 g/dL (ref 30.0–36.0)
MCV: 81.6 fL (ref 78.0–100.0)
RDW: 14.8 % (ref 11.5–15.5)

## 2012-12-01 LAB — GLUCOSE, CAPILLARY
Glucose-Capillary: 160 mg/dL — ABNORMAL HIGH (ref 70–99)
Glucose-Capillary: 114 mg/dL — ABNORMAL HIGH (ref 70–99)

## 2012-12-01 MED ORDER — FUROSEMIDE 10 MG/ML IJ SOLN
60.0000 mg | Freq: Once | INTRAMUSCULAR | Status: AC
  Administered 2012-12-01: 60 mg via INTRAVENOUS
  Filled 2012-12-01 (×2): qty 6

## 2012-12-01 MED ORDER — ACETAMINOPHEN 650 MG RE SUPP: 650.0000 mg | RECTAL | Status: DC | PRN

## 2012-12-01 MED ORDER — DEXTROSE 5 % IV SOLN
1.0000 g | INTRAVENOUS | Status: DC
  Administered 2012-12-01 – 2012-12-03 (×3): 1 g via INTRAVENOUS
  Filled 2012-12-01 (×4): qty 1

## 2012-12-01 NOTE — Progress Notes (Signed)
[facility] PROGRESS NOTE  [name] NWG:[id number] DOB: 14-Jul-1951 DOA: 11/28/2012 PCP: No primary provider on file.  Assessment/Plan: Enterobacter Bacteremia with UTI -Still febrile-immunocompromised-on immunosuppresives -Now on Cefepime -if still febrile-will consult ID  Nausea and Vomiting -likely 2/2 to above -seems to have resolved  Diarrhea -antibiotic induced vs cdiff.  -will check for cdiff -continue IV fluids  Acute Bronchitis vs Cardiac Asthma -CXR shows new cardiomegaly and congestion -- continue with supportive management with inhaled bronchodilators, mucolytics, antitussives and oxygen.  -given one dose of lasix today -await Echo -antibiotic changed from rocephin to zosyn 11/29/12 .   Acute cardiomegaly -will get 2decho   Hypoglycemia and diabetes mellitus type 2  -This is likely secondary to the insulin and the poor oral intake.  -Patient was given IV D5W and the ambulance on route to the hospital.  -Improved CBG range 10-154. Still not taking po consistently.  -Carbohydrate modified diet, insulin sliding scale. check hemoglobin A1c 7.2   Acute on chronic kidney disease stage III s/p transplant  -per renal -likely ATN-with Bacteremia, diuretics, ACEI contributing -continue with Cellcept, prednisone and cyclosporin  Hyperkalemia  -mild  -likely related to #4. Close monitoring . Will use kayexalate if k greater than 5.5 per renal. -apprecieate renal consult    Code Status: full Family Communication: wife at bedside Disposition Plan: home when ready.    Consultants:  Renal  ID  Procedures:  none  Antibiotics: rocephin (11/28/12-11/29/12  azythromycin 11/28/12-13/14  Zosyn 11/29/12>>>    HPI/Subjective: Lying in bed eyes closed. Arouses to verbal stimuli. Ill appearing  Objective: Filed Vitals:   11/30/12 2055 11/30/12 2301 12/01/12 0134 12/01/12 0636  BP:  142/54  162/65  Pulse:  108  80  Temp:  101.2 F (38.4 C) 99.1  F (37.3 C) 100.1 F (37.8 C)  TempSrc:  Oral Oral Oral  Resp:  24  18  Height:      Weight:      SpO2: 98% 93%  95%    Intake/Output Summary (Last 24 hours) at 12/01/12 0751 Last data filed at 11/30/12 1854  Gross per 24 hour  Intake    770 ml  Output    900 ml  Net   -130 ml   Filed Weights   11/29/12 0629  Weight: 126.5 kg (278 lb 14.1 oz)    Exam:   General:  Drowsy skin very warm dry  Cardiovascular: RRR No MGR No LEE  Respiratory: mild increased work of breathing. BS coarse with rhonchi/wheezes diffusely  Abdomen: obese soft +BS non-tender to palpation  Data Reviewed: Basic Metabolic Panel:  Lab 11/30/12 5784 11/30/12 0634 11/29/12 1917 11/29/12 0506 11/28/12 1505  NA 130* 133* 135 136 137  K 5.2* 5.0 4.8 5.4* 4.8  CL 100 101 107 104 106  CO2 16* 17* 17* 19 18*  GLUCOSE 126* 96 128* 87 56*  BUN 76* 73* 62* 66* 60*  CREATININE 3.15* 3.21* 2.68* 2.60* 2.17*  CALCIUM 8.3* 8.4 6.9* 8.7 9.1  MG -- -- -- 1.8 --  PHOS -- -- -- -- --   Liver Function Tests:  Lab 11/28/12 1505  AST 12  ALT 7  ALKPHOS 43  BILITOT 0.5  PROT 7.0  ALBUMIN 3.8    Lab 11/28/12 1505  LIPASE 21  AMYLASE --   No results found for this basename: AMMONIA:5 in the last 168 hours CBC:  Lab 11/30/12 0634 11/29/12 0506 11/28/12 1505  WBC 10.3 14.5* 13.5*  NEUTROABS -- -- 11.8*  HGB 8.7* 9.6* 9.8*  HCT 26.7* 31.0* 31.8*  MCV 83.2 84.9 84.8  PLT 139* 159 186   Cardiac Enzymes: No results found for this basename: CKTOTAL:5,CKMB:5,CKMBINDEX:5,TROPONINI:5 in the last 168 hours BNP (last 3 results) No results found for this basename: PROBNP:3 in the last 8760 hours CBG:  Lab 11/30/12 2259 11/30/12 1706 11/30/12 1143 11/30/12 0803 11/29/12 2117  GLUCAP 126* 133* 128* 105* 159*    Recent Results (from the past 240 hour(s))  URINE CULTURE     Status: Normal   Collection Time   11/28/12  4:45 PM      Component Value Range Status Comment   Specimen Description URINE, CLEAN  CATCH   Final    Special Requests NONE   Final    Culture  Setup Time 11/28/2012 22:15   Final    Colony Count >=100,000 COLONIES/ML   Final    Culture ENTEROBACTER AEROGENES   Final    Report Status 11/30/2012 FINAL   Final    Organism ID, Bacteria ENTEROBACTER AEROGENES   Final   CULTURE, BLOOD (ROUTINE X 2)     Status: Normal (Preliminary result)   Collection Time   11/28/12  5:30 PM      Component Value Range Status Comment   Specimen Description BLOOD RIGHT HAND   Final    Special Requests BOTTLES DRAWN AEROBIC AND ANAEROBIC 10CC   Final    Culture  Setup Time 11/29/2012 02:16   Final    Culture     Final    Value: GRAM NEGATIVE RODS     Note: Gram Stain Report Called to,Read Back By and Verified With: Rosalie Doctor RN @ 1243 11/29/12 BY KRAWS   Report Status PENDING   Incomplete   CULTURE, BLOOD (ROUTINE X 2)     Status: Normal (Preliminary result)   Collection Time   11/28/12  6:00 PM      Component Value Range Status Comment   Specimen Description BLOOD RIGHT ARM   Final    Special Requests BOTTLES DRAWN AEROBIC AND ANAEROBIC 10CC   Final    Culture  Setup Time 11/29/2012 02:16   Final    Culture     Final    Value: GRAM NEGATIVE RODS     Note: Gram Stain Report Called to,Read Back By and Verified With: Rosalie Doctor RN @ [phone] 11/29/12 BY KRAWS   Report Status PENDING   Incomplete      Studies: US Renal  11/29/2012  *RADIOLOGY REPORT*  Clinical Data: Acute renal failure on chronic renal disease.  RENAL/URINARY TRACT ULTRASOUND COMPLETE  Comparison:  None.  Findings:  Right Kidney:  9.9 cm in length.  Renal cortical thinning and increased echogenicity consistent with chronic medical renal disease.  No hydronephrosis or mass.  Left Kidney:  9.8 cm in length.  Renal cortical thinning and increased echogenicity consistent with chronic medical renal disease.  No hydronephrosis or mass.  Right lower quadrant transplant kidney:  12.6 cm in length.  Normal renal cortical thickness and  echogenicity without focal lesions or hydronephrosis.  No perinephric fluid collection.  Bladder:  Normal  IMPRESSION:  1.  Small echogenic native renal kidneys consistent with chronic medical renal disease. 2.  Right lower quadrant transplant kidney is normal.   Original Report Authenticated By: Rudie Meyer, M.D.     Scheduled Meds:   . albuterol  2.5 mg Nebulization Q6H  . amLODipine  5 mg Oral Daily  . aspirin  81 mg  Oral Daily  . azithromycin  500 mg Intravenous Q24H  . cycloSPORINE modified  150 mg Oral BID  . gabapentin  300 mg Oral BID  . guaiFENesin  1,200 mg Oral BID  . heparin  5,000 Units Subcutaneous Q8H  . insulin aspart  0-9 Units Subcutaneous TID WC  . ipratropium  0.5 mg Nebulization Q6H  . isosorbide dinitrate  30 mg Oral Daily  . labetalol  300 mg Oral BID  . metoCLOPramide  10 mg Oral Daily  . mycophenolate  1,000 mg Oral BID  . pantoprazole  40 mg Oral Daily  . piperacillin-tazobactam (ZOSYN)  IV  3.375 g Intravenous Q8H  . pravastatin  40 mg Oral QHS  . predniSONE  5 mg Oral Q breakfast  . sodium bicarbonate  650 mg Oral BID  . sodium chloride  3 mL Intravenous Q12H   Continuous Infusions:   . sodium chloride 100 mL/hr at 11/30/12 2046    Principal Problem:  *Gram-negative bacteremia Active Problems:  DM 2  DIABETIC PERIPHERAL NEUROPATHY  Acute bronchitis  History of renal transplant  Hypoglycemia  Nausea and vomiting  Slurred speech    Time spent: 30 minutes    [facility] M  [facility]  If 8PM-8AM, please contact night-coverage at [email or web address], password [facility] 12/01/2012, 7:51 AM  LOS: 3 days    Attending - I have seen and examined the patient-I have reviewed the above documentation, agree with the assessment and plan. He appears to have pul edema-? CHF. Also has Enterobacter UTI and bacteremia-have changed to Cefepime. If still febrile will need ID consult.  S Necole Minassian

## 2012-12-01 NOTE — Progress Notes (Signed)
*  PRELIMINARY RESULTS* Echocardiogram 2D Echocardiogram has been performed.  [name] 12/01/2012, 11:17 AM

## 2012-12-01 NOTE — Progress Notes (Signed)
Patient ID: [name], male   DOB: Sep 13, 1951, 62 y.o.   MRN: [id number]    [facility] Progress Note    Subjective:   Reports diarrhea overnight (On contact precautions as C diff PCR pending). Remains febrile with new cardiomegaly on CXR.   Objective:   BP 162/65  Pulse 80  Temp 100.1 F (37.8 C) (Oral)  Resp 18  Ht 6\' 2"  (1.88 m)  Wt 126.5 kg (278 lb 14.1 oz)  BMI 35.81 kg/m2  SpO2 95%  Intake/Output Summary (Last 24 hours) at 12/01/12 0817 Last data filed at 11/30/12 1854  Gross per 24 hour  Intake    770 ml  Output    900 ml  Net   -130 ml   Weight change:   Physical Exam: ZHY:[id number] resting in bed WUX:LKGMW RRR, normal S1 and S2  Resp:Coarse BS with audible wheeze NUU:VOZD, obese, NT, BS normal Ext:No LE edema  Imaging: US Renal  11/29/2012  *RADIOLOGY REPORT*  Clinical Data: Acute renal failure on chronic renal disease.  RENAL/URINARY TRACT ULTRASOUND COMPLETE  Comparison:  None.  Findings:  Right Kidney:  9.9 cm in length.  Renal cortical thinning and increased echogenicity consistent with chronic medical renal disease.  No hydronephrosis or mass.  Left Kidney:  9.8 cm in length.  Renal cortical thinning and increased echogenicity consistent with chronic medical renal disease.  No hydronephrosis or mass.  Right lower quadrant transplant kidney:  12.6 cm in length.  Normal renal cortical thickness and echogenicity without focal lesions or hydronephrosis.  No perinephric fluid collection.  Bladder:  Normal  IMPRESSION:  1.  Small echogenic native renal kidneys consistent with chronic medical renal disease. 2.  Right lower quadrant transplant kidney is normal.   Original Report Authenticated By: Rudie Meyer, M.D.     Labs: BMET  Lab 11/30/12 1323 11/30/12 6644 11/29/12 1917 11/29/12 0506 11/28/12 1505  NA 130* 133* 135 136 137  K 5.2* 5.0 4.8 5.4* 4.8  CL 100 101 107 104 106  CO2 16* 17* 17* 19 18*  GLUCOSE 126* 96 128* 87 56*  BUN  76* 73* 62* 66* 60*  CREATININE 3.15* 3.21* 2.68* 2.60* 2.17*  ALB -- -- -- -- --  CALCIUM 8.3* 8.4 6.9* 8.7 9.1  PHOS -- -- -- -- --   CBC  Lab 11/30/12 0634 11/29/12 0506 11/28/12 1505  WBC 10.3 14.5* 13.5*  NEUTROABS -- -- 11.8*  HGB 8.7* 9.6* 9.8*  HCT 26.7* 31.0* 31.8*  MCV 83.2 84.9 84.8  PLT 139* 159 186    Medications:      . albuterol  2.5 mg Nebulization Q6H  . amLODipine  5 mg Oral Daily  . aspirin  81 mg Oral Daily  . azithromycin  500 mg Intravenous Q24H  . cycloSPORINE modified  150 mg Oral BID  . gabapentin  300 mg Oral BID  . guaiFENesin  1,200 mg Oral BID  . heparin  5,000 Units Subcutaneous Q8H  . insulin aspart  0-9 Units Subcutaneous TID WC  . ipratropium  0.5 mg Nebulization Q6H  . isosorbide dinitrate  30 mg Oral Daily  . labetalol  300 mg Oral BID  . metoCLOPramide  10 mg Oral Daily  . mycophenolate  1,000 mg Oral BID  . pantoprazole  40 mg Oral Daily  . piperacillin-tazobactam (ZOSYN)  IV  3.375 g Intravenous Q8H  . pravastatin  40 mg Oral QHS  . predniSONE  5 mg Oral Q breakfast  .  sodium bicarbonate  650 mg Oral BID  . sodium chloride  3 mL Intravenous Q12H     Assessment/ Plan:   1. Acute renal failure on chronic kidney disease stage III T. (ESRD status post LRD 9 years ago): Appears ischemic ATN from volume depletion with ongoing ACE-I and diuretics. Renal function appears to have reached a plateau and UOP remains marginal- no indications for allograft biopsy as yet.  2. Acute Bronchitis: Clinically doing better on intravenous ceftriaxone/zosyn as well as bronchodilators. May likely need a pulmonary eval for further assistance with management. 3. Hypertension: Discontinue lisinopril and furosemide at this time while he is being followed resuscitated. We'll continue to monitor blood pressures closely for intermittent dosing with when necessary hydralazine/beta blockers  4. Hyperkalemia: Likely secondary to acute renal failure, continue to  monitor particularly while on cyclosporine. Anticipate will improve with improving urine output/renal function. Will use Kayexalate if potassium is greater than 5.5.  5. Diarrhea: C diff test pending 6. New cardiomegaly: ? pericardial effusion vs viral myocarditis. With congestion on CXR- will try a single dose of IV lasix   Zetta Bills, MD 12/01/2012, 8:17 AM

## 2012-12-01 NOTE — Progress Notes (Signed)
Physical Therapy Treatment Patient Details Name: [name] MRN: [id number] DOB: 14-Nov-1951 Today's Date: 12/01/2012 Time: 0223-0246 PT Time Calculation (min): 23 min  PT Assessment / Plan / Recommendation Comments on Treatment Session  Pt very pleasant & willing to participate.  Progressing slowly at this time- Fatigues quickly & weak.     Follow Up Recommendations  Home health PT;Supervision/Assistance - 24 hour     Does the patient have the potential to tolerate intense rehabilitation     Barriers to Discharge        Equipment Recommendations  None recommended by PT    Recommendations for Other Services OT consult  Frequency Min 3X/week   Plan      Precautions / Restrictions Restrictions Weight Bearing Restrictions: No       Mobility  Bed Mobility Bed Mobility: Not assessed Transfers Transfers: Sit to Stand;Stand to Sit Sit to Stand: 1: +2 Total assist;With upper extremity assist;From chair/3-in-1;With armrests Sit to Stand: Patient Percentage: 80% Stand to Sit: With upper extremity assist;With armrests;To chair/3-in-1;3: Mod assist Details for Transfer Assistance: Cues for initiation, hand placement, foot placement, & technique.  (A) to achieve standing, anterior translation, balance, & controlled descent.  Facilitation at shoulders & pelvis for increase trunk extension.   Ambulation/Gait Ambulation/Gait Assistance: 1: +2 Total assist Ambulation/Gait: Patient Percentage: 80% Ambulation Distance (Feet): 5 Feet Assistive device: Rolling walker Ambulation/Gait Assistance Details: Pt fatigues very quickly.   Small shuffling steps with flexed posture.  Gait Pattern: Trunk flexed;Shuffle Gait velocity: slow General Gait Details: Fatigues quickly & weak Stairs: No Wheelchair Mobility Wheelchair Mobility: No      PT Goals Acute Rehab PT Goals Time For Goal Achievement: 12/14/12 Potential to Achieve Goals: Good Pt will go Supine/Side to Sit: with  supervision Pt will go Sit to Supine/Side: with supervision Pt will go Sit to Stand: with supervision PT Goal: Sit to Stand - Progress: Not met Pt will go Stand to Sit: with supervision PT Goal: Stand to Sit - Progress: Progressing toward goal Pt will Ambulate: 51 - 150 feet;with supervision;with least restrictive assistive device PT Goal: Ambulate - Progress: Progressing toward goal  Visit Information  Last PT Received On: 12/01/12 Assistance Needed: +2    Subjective Data      Cognition  Overall Cognitive Status: Impaired Arousal/Alertness: Lethargic (alert but falling asleep throughout) Orientation Level: Appears intact for tasks assessed Behavior During Session: Lethargic Cognition - Other Comments: slow to respond    Balance  Balance Balance Assessed: Yes Static Standing Balance Static Standing - Balance Support: Bilateral upper extremity supported Static Standing - Level of Assistance: 4: Min assist Static Standing - Comment/# of Minutes: Pt fatigues quickly & has increased difficulty with standing upright.    End of Session PT - End of Session Equipment Utilized During Treatment: Gait belt Activity Tolerance: Patient limited by fatigue Patient left: in chair;with call bell/phone within reach;with family/visitor present Nurse Communication: Mobility status     [name], [place] [phone] 12/01/2012

## 2012-12-01 NOTE — Progress Notes (Signed)
Pt is feeling better and is no longer sweating or breathing heavy. Stat EKG was sinus rhythm. Will continue to monitor

## 2012-12-02 ENCOUNTER — Inpatient Hospital Stay (HOSPITAL_COMMUNITY): Payer: Medicare HMO

## 2012-12-02 LAB — CBC
HCT: 25.9 % — ABNORMAL LOW (ref 39.0–52.0)
MCH: 27.4 pg (ref 26.0–34.0)
MCV: 80.7 fL (ref 78.0–100.0)
RBC: 3.21 MIL/uL — ABNORMAL LOW (ref 4.22–5.81)
WBC: 9 10*3/uL (ref 4.0–10.5)

## 2012-12-02 LAB — BASIC METABOLIC PANEL
BUN: 87 mg/dL — ABNORMAL HIGH (ref 6–23)
CO2: 14 mEq/L — ABNORMAL LOW (ref 19–32)
Calcium: 8.3 mg/dL — ABNORMAL LOW (ref 8.4–10.5)
Chloride: 100 mEq/L (ref 96–112)
Creatinine, Ser: 3.17 mg/dL — ABNORMAL HIGH (ref 0.50–1.35)

## 2012-12-02 LAB — GLUCOSE, CAPILLARY
Glucose-Capillary: 108 mg/dL — ABNORMAL HIGH (ref 70–99)
Glucose-Capillary: 132 mg/dL — ABNORMAL HIGH (ref 70–99)
Glucose-Capillary: 157 mg/dL — ABNORMAL HIGH (ref 70–99)

## 2012-12-02 MED ORDER — IPRATROPIUM BROMIDE 0.02 % IN SOLN
0.5000 mg | Freq: Two times a day (BID) | RESPIRATORY_TRACT | Status: DC
  Administered 2012-12-02 – 2012-12-04 (×5): 0.5 mg via RESPIRATORY_TRACT
  Filled 2012-12-02 (×5): qty 2.5

## 2012-12-02 MED ORDER — FUROSEMIDE 10 MG/ML IJ SOLN
40.0000 mg | Freq: Once | INTRAMUSCULAR | Status: AC
  Administered 2012-12-02: 40 mg via INTRAVENOUS
  Filled 2012-12-02: qty 4

## 2012-12-02 MED ORDER — SODIUM BICARBONATE 650 MG PO TABS
650.0000 mg | ORAL_TABLET | Freq: Three times a day (TID) | ORAL | Status: DC
  Administered 2012-12-02 – 2012-12-03 (×6): 650 mg via ORAL
  Filled 2012-12-02 (×11): qty 1

## 2012-12-02 MED ORDER — ALBUTEROL SULFATE (5 MG/ML) 0.5% IN NEBU
2.5000 mg | INHALATION_SOLUTION | Freq: Two times a day (BID) | RESPIRATORY_TRACT | Status: DC
  Administered 2012-12-02 – 2012-12-04 (×5): 2.5 mg via RESPIRATORY_TRACT
  Filled 2012-12-02 (×5): qty 0.5

## 2012-12-02 NOTE — Progress Notes (Signed)
In to provide follow up from initial service introduction.  Patient is much improved.  Sitting up at bedside eating lunch. Today he is able to engage in conversation without significant SOB.  Patient indicated that his wife is very supportive of his care and should be involved in his follow up discussion.  I left contact information for his wife and requested that she call me directly to discuss service benefits.  Per previous encounter patient's wife has concern with him not taking his medicines as ordered and dietary compliance.  Spoke with attending physician, Dr [name], about the wife's home management concerns.  Requested that physician reinforce the importance of compliance and his participation in [facility] Care Management services.  Of note, [facility] Care Management services does not replace or interfere with any services that are arranged by inpatient case management or social work.  For additional questions or referrals please contact Anibal Henderson BSN RN [facility] Liaison at [phone].

## 2012-12-02 NOTE — Progress Notes (Signed)
[facility] PROGRESS NOTE  [name] ZOX:[id number] DOB: 1951-07-27 DOA: 11/28/2012 PCP: No primary provider on file.  Assessment/Plan: Enterobacter Bacteremia with UTI  -Still febrile-immunocompromised-on immunosuppresives  -Now on Cefepime day #2 - Fever curve has decreased, continue with antibiotics  Nausea and Vomiting  -likely 2/2 to above  -seems to have resolved   Diarrhea  -antibiotic induced vs cdiff.  -cdiff PCR-negative -continue IV fluids   Acute Bronchitis vs Cardiac Asthma  -CXR shows new cardiomegaly and congestion  - repeat xray today yields improved edema- likely with acute diastolic heart failure. Much better with IV Lasix -- continue with supportive management with inhaled bronchodilators, mucolytics, antitussives and oxygen.  -given one dose of lasix yesterday with improvement. Will give another today per renal  - Echo yields-EF 60%.thickness was increased in a pattern of moderate LVH. Systolic function was normal. Left atrium: The atrium was moderately dilated constant with diastolic dysfunction  -Acute cardiomegaly  -see above   Hypoglycemia and diabetes mellitus type 2  -This is likely secondary to the insulin and the poor oral intake.  -Patient was given IV D5W and the ambulance on route to the hospital.  -Improved CBG range 108-160. Still not taking po consistently.  -Carbohydrate modified diet, insulin sliding scale.  hemoglobin A1c 7.2   Acute on chronic kidney disease stage III s/p transplant  -per renal- creatinine continues to slowly down trend -likely ATN-with Bacteremia, diuretics, ACEI contributing  -urine ouput somewhat improved. Lasix today per renal -appreciate renal assistance  Hyperkalemia  -resolved today -likely related to #4. Close monitoring . Will use kayexalate if k greater than 5.5 per renal.  -apprecieate renal consult   Code Status: full Family Communication: wife at bedside Disposition Plan: home when ready  2 -3 days   Consultants:  renal  Procedures:  none  Antibiotics: rocephin (11/28/12-11/29/12  azythromycin 11/28/12-11/29/12  Zosyn 11/29/12>>>12/01/12 Maxipime 12/01/12>>>    HPI/Subjective: Lying in bed alert reports feeling better  Objective: Filed Vitals:   12/01/12 1820 12/01/12 2039 12/02/12 0215 12/02/12 0639  BP: 145/65 184/86 145/61 145/51  Pulse: 75 73 78 84  Temp: 98 F (36.7 C) 99.2 F (37.3 C) 99.1 F (37.3 C) 99.7 F (37.6 C)  TempSrc: Oral Oral Oral Oral  Resp: 20 22 22 22   Height:      Weight:      SpO2: 98% 97% 94% 95%    Intake/Output Summary (Last 24 hours) at 12/02/12 0951 Last data filed at 12/02/12 0853  Gross per 24 hour  Intake   5090 ml  Output   1900 ml  Net   3190 ml   Filed Weights   11/29/12 0629  Weight: 126.5 kg (278 lb 14.1 oz)    Exam:   General:  Awake alert oreinted NAD  Cardiovascular: RRR No MGR No LEE  Respiratory: improved effort, BS with improved air flow, mild rhonchi. Faint wheeze  Abdomen: soft +BS obese non-tender  Data Reviewed: Basic Metabolic Panel:  Lab 12/01/12 8119 11/30/12 1323 11/30/12 0634 11/29/12 1917 11/29/12 0506  NA 129* 130* 133* 135 136  K 4.9 5.2* 5.0 4.8 5.4*  CL 97 100 101 107 104  CO2 14* 16* 17* 17* 19  GLUCOSE 117* 126* 96 128* 87  BUN 82* 76* 73* 62* 66*  CREATININE 3.29* 3.15* 3.21* 2.68* 2.60*  CALCIUM 8.4 8.3* 8.4 6.9* 8.7  MG -- -- -- -- 1.8  PHOS -- -- -- -- --   Liver Function Tests:  Lab 11/28/12  1505  AST 12  ALT 7  ALKPHOS 43  BILITOT 0.5  PROT 7.0  ALBUMIN 3.8    Lab 11/28/12 1505  LIPASE 21  AMYLASE --   No results found for this basename: AMMONIA:5 in the last 168 hours CBC:  Lab 12/01/12 0943 11/30/12 0634 11/29/12 0506 11/28/12 1505  WBC 8.9 10.3 14.5* 13.5*  NEUTROABS -- -- -- 11.8*  HGB 9.0* 8.7* 9.6* 9.8*  HCT 27.5* 26.7* 31.0* 31.8*  MCV 81.6 83.2 84.9 84.8  PLT 153 139* 159 186   Cardiac Enzymes: No results found for this basename:  CKTOTAL:5,CKMB:5,CKMBINDEX:5,TROPONINI:5 in the last 168 hours BNP (last 3 results)  Basename 12/01/12 0943  PROBNP 4046.0*   CBG:  Lab 12/02/12 0745 12/01/12 2157 12/01/12 1656 12/01/12 1205 12/01/12 0757  GLUCAP 108* 114* 160* 159* 105*    Recent Results (from the past 240 hour(s))  URINE CULTURE     Status: Normal   Collection Time   11/28/12  4:45 PM      Component Value Range Status Comment   Specimen Description URINE, CLEAN CATCH   Final    Special Requests NONE   Final    Culture  Setup Time 11/28/2012 22:15   Final    Colony Count >=100,000 COLONIES/ML   Final    Culture ENTEROBACTER AEROGENES   Final    Report Status 11/30/2012 FINAL   Final    Organism ID, Bacteria ENTEROBACTER AEROGENES   Final   CULTURE, BLOOD (ROUTINE X 2)     Status: Normal   Collection Time   11/28/12  5:30 PM      Component Value Range Status Comment   Specimen Description BLOOD RIGHT HAND   Final    Special Requests BOTTLES DRAWN AEROBIC AND ANAEROBIC 10CC   Final    Culture  Setup Time 11/29/2012 02:16   Final    Culture     Final    Value: ENTEROBACTER AEROGENES     Note: Gram Stain Report Called to,Read Back By and Verified With: Rosalie Doctor RN @ [phone] 11/29/12 BY KRAWS   Report Status 12/01/2012 FINAL   Final    Organism ID, Bacteria ENTEROBACTER AEROGENES   Final   CULTURE, BLOOD (ROUTINE X 2)     Status: Normal   Collection Time   11/28/12  6:00 PM      Component Value Range Status Comment   Specimen Description BLOOD RIGHT ARM   Final    Special Requests BOTTLES DRAWN AEROBIC AND ANAEROBIC 10CC   Final    Culture  Setup Time 11/29/2012 02:16   Final    Culture     Final    Value: ENTEROBACTER AEROGENES     Note: SUSCEPTIBILITIES PERFORMED ON PREVIOUS CULTURE WITHIN THE LAST 5 DAYS.     Note: Gram Stain Report Called to,Read Back By and Verified With: Rosalie Doctor RN @ [phone] 11/29/12 BY KRAWS   Report Status 12/01/2012 FINAL   Final   CLOSTRIDIUM DIFFICILE BY PCR     Status: Normal    Collection Time   12/01/12  6:04 AM      Component Value Range Status Comment   C difficile by pcr NEGATIVE  NEGATIVE Final      Studies: Dg Chest Port 1 View  12/02/2012  *RADIOLOGY REPORT*  Clinical Data: Short of breath  PORTABLE CHEST - 1 VIEW  Comparison: 12/01/2012.  Findings: The heart is mildly enlarged.  Pulmonary edema has improved.  Increased basilar atelectasis.  No pneumothorax.  IMPRESSION: Improved edema.  Increasing basilar volume loss.   Original Report Authenticated By: Jolaine Click, M.D.    Dg Chest Port 1 View  12/01/2012  *RADIOLOGY REPORT*  Clinical Data: Shortness of breath and wheezing.  PORTABLE CHEST - 1 VIEW  Comparison: 11/28/2012  Findings: 1014 hours. The cardiopericardial silhouette is enlarged. Vascular congestion with associated alveolar opacity having a lower lobe predominance. Telemetry leads overlie the chest.  IMPRESSION: Cardiomegaly with interstitial and basilar airspace opacity compatible with edema.   Original Report Authenticated By: Kennith Center, M.D.     Scheduled Meds:   . albuterol  2.5 mg Nebulization BID  . amLODipine  5 mg Oral Daily  . aspirin  81 mg Oral Daily  . ceFEPime (MAXIPIME) IV  1 g Intravenous Q24H  . cycloSPORINE modified  150 mg Oral BID  . furosemide  40 mg Intravenous Once  . gabapentin  300 mg Oral BID  . guaiFENesin  1,200 mg Oral BID  . heparin  5,000 Units Subcutaneous Q8H  . insulin aspart  0-9 Units Subcutaneous TID WC  . ipratropium  0.5 mg Nebulization BID  . isosorbide dinitrate  30 mg Oral Daily  . labetalol  300 mg Oral BID  . metoCLOPramide  10 mg Oral Daily  . mycophenolate  1,000 mg Oral BID  . pantoprazole  40 mg Oral Daily  . pravastatin  40 mg Oral QHS  . predniSONE  5 mg Oral Q breakfast  . sodium bicarbonate  650 mg Oral TID  . sodium chloride  3 mL Intravenous Q12H   Continuous Infusions:   Principal Problem:  *Gram-negative bacteremia Active Problems:  DM 2  DIABETIC PERIPHERAL NEUROPATHY   Acute bronchitis  History of renal transplant  Hypoglycemia  Nausea and vomiting  Slurred speech    Time spent: 30 minutes    [facility] M  [facility]  If 8PM-8AM, please contact night-coverage at [email or web address], password [email or web address] 12/02/2012, 9:51 AM  LOS: 4 days    Attending -I've seen and examined the patient, I have reviewed the above documentation and agree with the assessment and plan as outlined above. I have made the necessary changes. Mrs. Careers adviser is doing much better today, yesterday he had pulmonary anemia from probable acute diastolic heart failure, he is significantly better today. His diarrhea has also resolved, C. difficile PCR is negative. Hopefully his creatinine will continue to downtrend. He has Enterobacter bacteremia and is on IV cefepime.  S Ghimire

## 2012-12-02 NOTE — Progress Notes (Addendum)
Patient ID: [name], male   DOB: April 20, 1951, 62 y.o.   MRN: [id number]   [facility] Progress Note    Subjective:   Reports to be feeling much better today. Dyspnea is improved.   Objective:   BP 145/51  Pulse 84  Temp 99.7 F (37.6 C) (Oral)  Resp 22  Ht 6\' 2"  (1.88 m)  Wt 126.5 kg (278 lb 14.1 oz)  BMI 35.81 kg/m2  SpO2 95%  Intake/Output Summary (Last 24 hours) at 12/02/12 0810 Last data filed at 12/02/12 0600  Gross per 24 hour  Intake   4850 ml  Output   2300 ml  Net   2550 ml   Weight change:   Physical Exam: XBJ:YNWGNFAOZHY resting in bed QMV:HQION RRR, normal S1 and S2  Resp:Coarse BS bilaterally, no rales/rhonchi GEX:BMWU, obese, no allograft site tenderness XLK:GMWNU-2+ edema over LEs  Imaging: Dg Chest Port 1 View  12/01/2012  *RADIOLOGY REPORT*  Clinical Data: Shortness of breath and wheezing.  PORTABLE CHEST - 1 VIEW  Comparison: 11/28/2012  Findings: 1014 hours. The cardiopericardial silhouette is enlarged. Vascular congestion with associated alveolar opacity having a lower lobe predominance. Telemetry leads overlie the chest.  IMPRESSION: Cardiomegaly with interstitial and basilar airspace opacity compatible with edema.   Original Report Authenticated By: Kennith Center, M.D.     Labs: BMET  Lab 12/01/12 7253 11/30/12 1323 11/30/12 6644 11/29/12 1917 11/29/12 0506 11/28/12 1505  NA 129* 130* 133* 135 136 137  K 4.9 5.2* 5.0 4.8 5.4* 4.8  CL 97 100 101 107 104 106  CO2 14* 16* 17* 17* 19 18*  GLUCOSE 117* 126* 96 128* 87 56*  BUN 82* 76* 73* 62* 66* 60*  CREATININE 3.29* 3.15* 3.21* 2.68* 2.60* 2.17*  ALB -- -- -- -- -- --  CALCIUM 8.4 8.3* 8.4 6.9* 8.7 9.1  PHOS -- -- -- -- -- --   CBC  Lab 12/01/12 0943 11/30/12 0634 11/29/12 0506 11/28/12 1505  WBC 8.9 10.3 14.5* 13.5*  NEUTROABS -- -- -- 11.8*  HGB 9.0* 8.7* 9.6* 9.8*  HCT 27.5* 26.7* 31.0* 31.8*  MCV 81.6 83.2 84.9 84.8  PLT 153 139* 159 186    Medications:        . albuterol  2.5 mg Nebulization BID  . amLODipine  5 mg Oral Daily  . aspirin  81 mg Oral Daily  . ceFEPime (MAXIPIME) IV  1 g Intravenous Q24H  . cycloSPORINE modified  150 mg Oral BID  . gabapentin  300 mg Oral BID  . guaiFENesin  1,200 mg Oral BID  . heparin  5,000 Units Subcutaneous Q8H  . insulin aspart  0-9 Units Subcutaneous TID WC  . ipratropium  0.5 mg Nebulization BID  . isosorbide dinitrate  30 mg Oral Daily  . labetalol  300 mg Oral BID  . metoCLOPramide  10 mg Oral Daily  . mycophenolate  1,000 mg Oral BID  . pantoprazole  40 mg Oral Daily  . pravastatin  40 mg Oral QHS  . predniSONE  5 mg Oral Q breakfast  . sodium bicarbonate  650 mg Oral BID  . sodium chloride  3 mL Intravenous Q12H     Assessment/ Plan:   1. Acute renal failure on chronic kidney disease stage III T. (ESRD status post LRD 9 years ago): Appears ischemic ATN from volume depletion with ongoing ACE-I and diuretics. Renal function appears to have reached a plateau and UOP imroved s/p lasix. No indications for  allograft biopsy as yet.  2. Acute Bronchitis: Clinically doing better on intravenous ceftriaxone/zosyn as well as bronchodilators.  3. Hypertension: Discontinue lisinopril and use bolus doses of furosemide at this time. We'll continue to monitor blood pressures closely for intermittent dosing with when necessary hydralazine/beta blockers  4. Hyperkalemia: Likely secondary to acute renal failure, continue to monitor particularly while on cyclosporine. Anticipate will improve with improving urine output/renal function. Will use Kayexalate if potassium is greater than 5.5.  5. Enterobacter bacteremia: on zosyn- source identification will be needed ?gut  6. New cardiomegaly: Diastolic CHF noted- improved on lasix   Zetta Bills, MD 12/02/2012, 8:10 AM

## 2012-12-02 NOTE — Progress Notes (Signed)
Pt placed on home CPAP. Tolerating well, RT/RN will continue to monitor.

## 2012-12-03 DIAGNOSIS — E872 Acidosis: Secondary | ICD-10-CM | POA: Diagnosis not present

## 2012-12-03 LAB — CBC
Hemoglobin: 8.8 g/dL — ABNORMAL LOW (ref 13.0–17.0)
MCV: 79.3 fL (ref 78.0–100.0)
Platelets: 181 10*3/uL (ref 150–400)
RBC: 3.23 MIL/uL — ABNORMAL LOW (ref 4.22–5.81)
WBC: 9.1 10*3/uL (ref 4.0–10.5)

## 2012-12-03 LAB — RENAL FUNCTION PANEL
Albumin: 2.8 g/dL — ABNORMAL LOW (ref 3.5–5.2)
Calcium: 8.7 mg/dL (ref 8.4–10.5)
Creatinine, Ser: 2.65 mg/dL — ABNORMAL HIGH (ref 0.50–1.35)
GFR calc non Af Amer: 24 mL/min — ABNORMAL LOW (ref >90)

## 2012-12-03 LAB — GLUCOSE, CAPILLARY: Glucose-Capillary: 208 mg/dL — ABNORMAL HIGH (ref 70–99)

## 2012-12-03 MED ORDER — FUROSEMIDE 80 MG PO TABS
80.0000 mg | ORAL_TABLET | Freq: Every day | ORAL | Status: DC
  Administered 2012-12-03 – 2012-12-04 (×2): 80 mg via ORAL
  Filled 2012-12-03 (×2): qty 1

## 2012-12-03 NOTE — Progress Notes (Signed)
[facility] PROGRESS NOTE  [name] ZOX:[id number] DOB: June 07, 1951 DOA: 11/28/2012 PCP: No primary provider on file.  Assessment/Plan: Enterobacter Bacteremia with UTI  -afebrile-immunocompromised-on immunosuppresives  -Now on Cefepime day #3  - Fever curve has decreased, continue with antibiotics  Nausea and Vomiting  -likely 2/2 to above  -seems to have resolved  Diarrhea  -antibiotic induced vs cdiff.  -cdiff PCR-negative  -continue IV fluids  -4 episodes of loose stool last 24 hours Acute Bronchitis vs Cardiac Asthma  -CXR shows new cardiomegaly and congestion  - repeat xray 12/02/12 yields improved edema- likely with acute diastolic heart failure. Much better with IV Lasix  -- continue with supportive management with inhaled bronchodilators, mucolytics, antitussives and oxygen.  -given one dose of lasix 1/15 and 1/16 with improvement.  - Echo yields-EF 60%.thickness was increased in a pattern of moderate LVH. Systolic function was normal. Left atrium: The atrium was moderately dilated constant with diastolic dysfunction  -Acute cardiomegaly  -see above  Metabolic acidosis - related to #1 and #8. Hypoglycemia and diabetes mellitus type 2  -This is likely secondary to the insulin and the poor oral intake.  -Patient was given IV D5W and the ambulance on route to the hospital.  -Improved CBG range 132-157. Appetite improving  -Carbohydrate modified diet, insulin sliding scale. hemoglobin A1c 7.2  Acute on chronic kidney disease stage III s/p transplant  -per renal- creatinine continues to slowly down trend  -likely ATN-with Bacteremia, diuretics, ACEI contributing  -urine ouput somewhat improved.   -appreciate renal assistance  Hyperkalemia  -resolved  -likely related to #4. Close monitoring . Will use kayexalate if k greater than 5.5 per renal.  -apprecieate renal consult   Code Status: full Family Communication:  Disposition Plan: home hopefully  tomorrow   Consultants:  renal  Procedures:  none  Antibiotics: rocephin (11/28/12-11/29/12  azythromycin 11/28/12-11/29/12  Zosyn 11/29/12>>>12/01/12  Maxipime 12/01/12>>>   HPI/Subjective: Up in chair. Reports feeling much better  Objective: Filed Vitals:   12/02/12 2117 12/03/12 0605 12/03/12 0845 12/03/12 0957  BP: 150/67 159/89  179/77  Pulse: 89 79  86  Temp: 98.4 F (36.9 C) 98.3 F (36.8 C)    TempSrc: Oral Oral    Resp: 18 20    Height:      Weight:      SpO2: 96% 97% 100%     Intake/Output Summary (Last 24 hours) at 12/03/12 1000 Last data filed at 12/03/12 0957  Gross per 24 hour  Intake      6 ml  Output    600 ml  Net   -594 ml   Filed Weights   11/29/12 0629  Weight: 126.5 kg (278 lb 14.1 oz)    Exam:   General:  Alert oriented NAD  Cardiovascular: RRR No MGR No LEE  Respiratory: normal effort BS improved air flow. No wheeze  Abdomen: obese soft +BS non-tender  Data Reviewed: Basic Metabolic Panel:  Lab 12/03/12 8119 12/02/12 1004 12/01/12 0943 11/30/12 1323 11/30/12 0634 11/29/12 0506  NA 131* 131* 129* 130* 133* --  K 4.8 5.1 4.9 5.2* 5.0 --  CL 99 100 97 100 101 --  CO2 13* 14* 14* 16* 17* --  GLUCOSE 164* 138* 117* 126* 96 --  BUN 83* 87* 82* 76* 73* --  CREATININE 2.65* 3.17* 3.29* 3.15* 3.21* --  CALCIUM 8.7 8.3* 8.4 8.3* 8.4 --  MG 2.4 -- -- -- -- 1.8  PHOS 4.0 -- -- -- -- --  Liver Function Tests:  Lab 12/03/12 0455 11/28/12 1505  AST -- 12  ALT -- 7  ALKPHOS -- 43  BILITOT -- 0.5  PROT -- 7.0  ALBUMIN 2.8* 3.8    Lab 11/28/12 1505  LIPASE 21  AMYLASE --   No results found for this basename: AMMONIA:5 in the last 168 hours CBC:  Lab 12/03/12 0455 12/02/12 1004 12/01/12 0943 11/30/12 0634 11/29/12 0506 11/28/12 1505  WBC 9.1 9.0 8.9 10.3 14.5* --  NEUTROABS -- -- -- -- -- 11.8*  HGB 8.8* 8.8* 9.0* 8.7* 9.6* --  HCT 25.6* 25.9* 27.5* 26.7* 31.0* --  MCV 79.3 80.7 81.6 83.2 84.9 --  PLT 181 174 153 139* 159  --   Cardiac Enzymes: No results found for this basename: CKTOTAL:5,CKMB:5,CKMBINDEX:5,TROPONINI:5 in the last 168 hours BNP (last 3 results)  Basename 12/01/12 0943  PROBNP 4046.0*   CBG:  Lab 12/03/12 0757 12/02/12 2111 12/02/12 1723 12/02/12 1235 12/02/12 0745  GLUCAP 149* 157* 132* 152* 108*    Recent Results (from the past 240 hour(s))  URINE CULTURE     Status: Normal   Collection Time   11/28/12  4:45 PM      Component Value Range Status Comment   Specimen Description URINE, CLEAN CATCH   Final    Special Requests NONE   Final    Culture  Setup Time 11/28/2012 22:15   Final    Colony Count >=100,000 COLONIES/ML   Final    Culture ENTEROBACTER AEROGENES   Final    Report Status 11/30/2012 FINAL   Final    Organism ID, Bacteria ENTEROBACTER AEROGENES   Final   CULTURE, BLOOD (ROUTINE X 2)     Status: Normal   Collection Time   11/28/12  5:30 PM      Component Value Range Status Comment   Specimen Description BLOOD RIGHT HAND   Final    Special Requests BOTTLES DRAWN AEROBIC AND ANAEROBIC 10CC   Final    Culture  Setup Time 11/29/2012 02:16   Final    Culture     Final    Value: ENTEROBACTER AEROGENES     Note: Gram Stain Report Called to,Read Back By and Verified With: Rosalie Doctor RN @ 1243 11/29/12 BY KRAWS   Report Status 12/01/2012 FINAL   Final    Organism ID, Bacteria ENTEROBACTER AEROGENES   Final   CULTURE, BLOOD (ROUTINE X 2)     Status: Normal   Collection Time   11/28/12  6:00 PM      Component Value Range Status Comment   Specimen Description BLOOD RIGHT ARM   Final    Special Requests BOTTLES DRAWN AEROBIC AND ANAEROBIC 10CC   Final    Culture  Setup Time 11/29/2012 02:16   Final    Culture     Final    Value: ENTEROBACTER AEROGENES     Note: SUSCEPTIBILITIES PERFORMED ON PREVIOUS CULTURE WITHIN THE LAST 5 DAYS.     Note: Gram Stain Report Called to,Read Back By and Verified With: Rosalie Doctor RN @ [phone] 11/29/12 BY KRAWS   Report Status 12/01/2012 FINAL    Final   CLOSTRIDIUM DIFFICILE BY PCR     Status: Normal   Collection Time   12/01/12  6:04 AM      Component Value Range Status Comment   C difficile by pcr NEGATIVE  NEGATIVE Final      Studies: Dg Chest Port 1 View  12/02/2012  *RADIOLOGY REPORT*  Clinical Data: Short of breath  PORTABLE CHEST - 1 VIEW  Comparison: 12/01/2012.  Findings: The heart is mildly enlarged.  Pulmonary edema has improved.  Increased basilar atelectasis.  No pneumothorax.  IMPRESSION: Improved edema.  Increasing basilar volume loss.   Original Report Authenticated By: Jolaine Click, M.D.    Dg Chest Port 1 View  12/01/2012  *RADIOLOGY REPORT*  Clinical Data: Shortness of breath and wheezing.  PORTABLE CHEST - 1 VIEW  Comparison: 11/28/2012  Findings: 1014 hours. The cardiopericardial silhouette is enlarged. Vascular congestion with associated alveolar opacity having a lower lobe predominance. Telemetry leads overlie the chest.  IMPRESSION: Cardiomegaly with interstitial and basilar airspace opacity compatible with edema.   Original Report Authenticated By: Kennith Center, M.D.     Scheduled Meds:   . albuterol  2.5 mg Nebulization BID  . amLODipine  5 mg Oral Daily  . aspirin  81 mg Oral Daily  . ceFEPime (MAXIPIME) IV  1 g Intravenous Q24H  . cycloSPORINE modified  150 mg Oral BID  . furosemide  80 mg Oral Daily  . gabapentin  300 mg Oral BID  . guaiFENesin  1,200 mg Oral BID  . heparin  5,000 Units Subcutaneous Q8H  . insulin aspart  0-9 Units Subcutaneous TID WC  . ipratropium  0.5 mg Nebulization BID  . isosorbide dinitrate  30 mg Oral Daily  . labetalol  300 mg Oral BID  . metoCLOPramide  10 mg Oral Daily  . mycophenolate  1,000 mg Oral BID  . pantoprazole  40 mg Oral Daily  . pravastatin  40 mg Oral QHS  . predniSONE  5 mg Oral Q breakfast  . sodium bicarbonate  650 mg Oral TID  . sodium chloride  3 mL Intravenous Q12H   Continuous Infusions:   Principal Problem:  *Gram-negative  bacteremia Active Problems:  DM 2  DIABETIC PERIPHERAL NEUROPATHY  Acute bronchitis  History of renal transplant  Hypoglycemia  Nausea and vomiting  Slurred speech  Metabolic acidosis    Time spent: 30 minutes    [facility] M  [facility]  If 8PM-8AM, please contact night-coverage at [email or web address], password [email or web address] 12/03/2012, 10:00 AM  LOS: 5 days    Attending - Patient seen and examined, agree with the above assessment and plan. He is significantly better today, his nausea vomiting and diarrhea have all resolved. He is breathing much better. She continues to improve, anticipate discharge in the next 24 hours.  S Ghimire

## 2012-12-03 NOTE — Care Management Note (Signed)
    Page 1 of 2   12/03/2012     6:07:00 PM   CARE MANAGEMENT NOTE 12/03/2012  Patient:  [name], [name]   Account Number:  [id number]  Date Initiated:  12/03/2012  Documentation initiated by:  Letha Cape  Subjective/Objective Assessment:   dx gram neg bacteremia  admit- lives with spouse.     Action/Plan:   pt eval- recs hhpt   Anticipated DC Date:  12/04/2012   Anticipated DC Plan:  HOME W HOME HEALTH SERVICES      DC Planning Services  CM consult      [facility] Choice  HOME HEALTH   Choice offered to / List presented to:  C-1 Patient   DME arranged  OTHER - SEE COMMENT      DME agency  [facility]     HH arranged  HH-2 PT      [facility] agency  [facility]   Status of service:  Completed, signed off Medicare Important Message given?   (If response is "NO", the following Medicare IM given date fields will be blank) Date Medicare IM given:   Date Additional Medicare IM given:    Discharge Disposition:  HOME W HOME HEALTH SERVICES  Per UR Regulation:  Reviewed for med. necessity/level of care/duration of stay  If discussed at Long Length of Stay Meetings, dates discussed:    Comments:  12/03/12 16:33 Letha Cape RN, BSN [phone] patient lives with spouse, per physical therapy recs hhpt, and rollator.  Patient chose [facility] from agency list, referral made to [facility], Lupita Leash notified and Jill Alexanders notified for rollator.  Patient for dc tomorrow.  Soc will begin 24-48 hrs post discharge.

## 2012-12-03 NOTE — Progress Notes (Signed)
[facility]  Patient Status: New  [facility] is providing the following services: PT  If patient discharges after hours, please call [phone].   Wynelle Bourgeois 12/03/2012, 5:19 PM

## 2012-12-03 NOTE — Progress Notes (Signed)
Patient ID: Suszanne Conners Sitter, male   DOB: 20-Apr-1951, 62 y.o.   MRN: [id number]   [facility] Progress Note    Subjective:   Mr. Burbach reports that he feels well today-no further shortness of breath. Reports improving appetite.    Objective:   BP 159/89  Pulse 79  Temp 98.3 F (36.8 C) (Oral)  Resp 20  Ht 6\' 2"  (1.88 m)  Wt 126.5 kg (278 lb 14.1 oz)  BMI 35.81 kg/m2  SpO2 97%  Intake/Output Summary (Last 24 hours) at 12/03/12 0825 Last data filed at 12/03/12 0300  Gross per 24 hour  Intake    543 ml  Output   1000 ml  Net   -457 ml   Weight change:   Physical Exam: Gen: Comfortably sitting up in his recliner-able to complete sentences without problem CVS: Pulse regular in rate and rhythm, heart sounds S1 and S2 normal Resp: Coarse breath sounds bilaterally-no specific rales or rhonchi Abd: Soft, obese, nontender and bowel sounds are normal Ext: Trace lower extremity edema  Imaging: Dg Chest Port 1 View  12/02/2012  *RADIOLOGY REPORT*  Clinical Data: Short of breath  PORTABLE CHEST - 1 VIEW  Comparison: 12/01/2012.  Findings: The heart is mildly enlarged.  Pulmonary edema has improved.  Increased basilar atelectasis.  No pneumothorax.  IMPRESSION: Improved edema.  Increasing basilar volume loss.   Original Report Authenticated By: Jolaine Click, M.D.    Dg Chest Port 1 View  12/01/2012  *RADIOLOGY REPORT*  Clinical Data: Shortness of breath and wheezing.  PORTABLE CHEST - 1 VIEW  Comparison: 11/28/2012  Findings: 1014 hours. The cardiopericardial silhouette is enlarged. Vascular congestion with associated alveolar opacity having a lower lobe predominance. Telemetry leads overlie the chest.  IMPRESSION: Cardiomegaly with interstitial and basilar airspace opacity compatible with edema.   Original Report Authenticated By: Kennith Center, M.D.     Labs: BMET  Lab 12/03/12 4132 12/02/12 1004 12/01/12 4401 11/30/12 1323 11/30/12 0634 11/29/12 1917 11/29/12 0506    NA 131* 131* 129* 130* 133* 135 136  K 4.8 5.1 4.9 5.2* 5.0 4.8 5.4*  CL 99 100 97 100 101 107 104  CO2 13* 14* 14* 16* 17* 17* 19  GLUCOSE 164* 138* 117* 126* 96 128* 87  BUN 83* 87* 82* 76* 73* 62* 66*  CREATININE 2.65* 3.17* 3.29* 3.15* 3.21* 2.68* 2.60*  ALB -- -- -- -- -- -- --  CALCIUM 8.7 8.3* 8.4 8.3* 8.4 6.9* 8.7  PHOS 4.0 -- -- -- -- -- --   CBC  Lab 12/03/12 0455 12/02/12 1004 12/01/12 0943 11/30/12 0634 11/28/12 1505  WBC 9.1 9.0 8.9 10.3 --  NEUTROABS -- -- -- -- 11.8*  HGB 8.8* 8.8* 9.0* 8.7* --  HCT 25.6* 25.9* 27.5* 26.7* --  MCV 79.3 80.7 81.6 83.2 --  PLT 181 174 153 139* --    Medications:      . albuterol  2.5 mg Nebulization BID  . amLODipine  5 mg Oral Daily  . aspirin  81 mg Oral Daily  . ceFEPime (MAXIPIME) IV  1 g Intravenous Q24H  . cycloSPORINE modified  150 mg Oral BID  . gabapentin  300 mg Oral BID  . guaiFENesin  1,200 mg Oral BID  . heparin  5,000 Units Subcutaneous Q8H  . insulin aspart  0-9 Units Subcutaneous TID WC  . ipratropium  0.5 mg Nebulization BID  . isosorbide dinitrate  30 mg Oral Daily  . labetalol  300 mg  Oral BID  . metoCLOPramide  10 mg Oral Daily  . mycophenolate  1,000 mg Oral BID  . pantoprazole  40 mg Oral Daily  . pravastatin  40 mg Oral QHS  . predniSONE  5 mg Oral Q breakfast  . sodium bicarbonate  650 mg Oral TID  . sodium chloride  3 mL Intravenous Q12H     Assessment/ Plan:   1. Acute renal failure on chronic kidney disease stage III T. (ESRD status post LRD 9 years ago): Appears ischemic ATN from volume depletion with ongoing ACE-I and diuretics. Renal function now improving following brief plateau phase-consistent with ATN. Remains with metabolic acidosis for which he is getting 3 times a day sodium bicarbonate-anticipate to improve his renal function continues to improve. No indication for allograft biopsy/clinical indication of acute rejection with improvement. 2. Acute Bronchitis: Clinically doing better  on intravenous cefepime as well as bronchodilators.  3. Hypertension: Discontinue lisinopril and use bolus doses of furosemide at this time. We'll continue to monitor blood pressures closely for intermittent dosing with when necessary hydralazine/beta blockers  4. Hyperkalemia: Likely secondary to acute renal failure, improving with improving renal function.  5. Enterobacter bacteremia: on Cefepime- source identification will be needed ?gut  6. New cardiomegaly: Diastolic CHF noted- improved on lasix   Zetta Bills, MD 12/03/2012, 8:25 AM

## 2012-12-03 NOTE — Progress Notes (Signed)
Physical Therapy Treatment Patient Details Name: [name] MRN: [id number] DOB: 1951/11/04 Today's Date: 12/03/2012 Time: 5284-1324 PT Time Calculation (min): 24 min  PT Assessment / Plan / Recommendation Comments on Treatment Session  Pt with improved mobility but still fatigues quickly which has been an issue prior to admission (has to use motorized cart in store, doesn't go out much).  Recommend rollator so pt will have a seat to rest when he fatigues while amb.    Follow Up Recommendations  Home health PT;Supervision/Assistance - 24 hour     Does the patient have the potential to tolerate intense rehabilitation     Barriers to Discharge        Equipment Recommendations  Other (comment) (rollator)    Recommendations for Other Services    Frequency Min 3X/week   Plan Discharge plan remains appropriate    Precautions / Restrictions Precautions Precautions: Fall   Pertinent Vitals/Pain No c/o's    Mobility  Transfers Transfers: Stand Pivot Transfers Sit to Stand: 4: Min assist;With upper extremity assist;From chair/3-in-1 Stand to Sit: 4: Min assist;With upper extremity assist;With armrests;To chair/3-in-1 Stand Pivot Transfers: 4: Min assist;With armrests Details for Transfer Assistance: slight assist to lift hips Ambulation/Gait Ambulation/Gait Assistance: 4: Min assist (+1 for IV) Ambulation Distance (Feet): 50 Feet Assistive device: Rolling walker Ambulation/Gait Assistance Details: Twice pt bent forward propping forearms on walker to rest.  Wife reports pt wasn't able to go long distances at baseline but he isn't back to baseline yet. Gait Pattern: Step-through pattern;Decreased stride length;Trunk flexed;Wide base of support Gait velocity: decr    Exercises     PT Diagnosis:    PT Problem List:   PT Treatment Interventions:     PT Goals Acute Rehab PT Goals PT Goal: Sit to Stand - Progress: Progressing toward goal PT Goal: Stand to Sit - Progress:  Progressing toward goal PT Goal: Ambulate - Progress: Progressing toward goal  Visit Information  Last PT Received On: 12/03/12 Assistance Needed: +2 (for lines)    Subjective Data  Subjective: Pt reports he is feeling better.   Cognition  Overall Cognitive Status: Appears within functional limits for tasks assessed/performed Arousal/Alertness: Awake/alert Orientation Level: Appears intact for tasks assessed Behavior During Session: [facility] for tasks performed    Balance  Static Standing Balance Static Standing - Balance Support: Bilateral upper extremity supported;During functional activity (on walker) Static Standing - Level of Assistance: 5: Stand by assistance  End of Session PT - End of Session Equipment Utilized During Treatment: Other (comment) (pt too large for my gait belt.) Patient left: in chair;with call bell/phone within reach;with family/visitor present Nurse Communication: Mobility status   GP     [name] 12/03/2012, 12:28 PM  [employer] PT [phone]

## 2012-12-04 DIAGNOSIS — Z940 Kidney transplant status: Secondary | ICD-10-CM

## 2012-12-04 LAB — RENAL FUNCTION PANEL
BUN: 70 mg/dL — ABNORMAL HIGH (ref 6–23)
CO2: 17 mEq/L — ABNORMAL LOW (ref 19–32)
Calcium: 9.2 mg/dL (ref 8.4–10.5)
Creatinine, Ser: 1.82 mg/dL — ABNORMAL HIGH (ref 0.50–1.35)
GFR calc Af Amer: 44 mL/min — ABNORMAL LOW (ref >90)
Glucose, Bld: 243 mg/dL — ABNORMAL HIGH (ref 70–99)

## 2012-12-04 LAB — MAGNESIUM: Magnesium: 2.1 mg/dL (ref 1.5–2.5)

## 2012-12-04 MED ORDER — LISINOPRIL 20 MG PO TABS: 20.0000 mg | ORAL_TABLET | Freq: Every day | ORAL | Status: DC

## 2012-12-04 MED ORDER — CIPROFLOXACIN HCL 500 MG PO TABS: 500.0000 mg | ORAL_TABLET | Freq: Two times a day (BID) | ORAL | Status: DC

## 2012-12-04 MED ORDER — HYDRALAZINE HCL 20 MG/ML IJ SOLN
5.0000 mg | Freq: Four times a day (QID) | INTRAMUSCULAR | Status: DC | PRN
  Administered 2012-12-04: 5 mg via INTRAVENOUS
  Filled 2012-12-04 (×2): qty 0.25

## 2012-12-04 MED ORDER — ALBUTEROL SULFATE HFA 108 (90 BASE) MCG/ACT IN AERS: 2.0000 | INHALATION_SPRAY | RESPIRATORY_TRACT | Status: DC | PRN

## 2012-12-04 MED ORDER — INSULIN GLARGINE 100 UNIT/ML ~~LOC~~ SOLN: 15.0000 [IU] | Freq: Every day | SUBCUTANEOUS | Status: DC

## 2012-12-04 MED ORDER — SODIUM BICARBONATE 650 MG PO TABS
650.0000 mg | ORAL_TABLET | Freq: Two times a day (BID) | ORAL | Status: DC
  Filled 2012-12-04: qty 1

## 2012-12-04 MED ORDER — SODIUM BICARBONATE 650 MG PO TABS: 650.0000 mg | ORAL_TABLET | Freq: Two times a day (BID) | ORAL | Status: DC

## 2012-12-04 NOTE — Progress Notes (Signed)
Nsg Discharge Note  Admit Date:  11/28/2012 Discharge date: 12/04/2012   [name] to be D/C'd Home per MD order.  AVS completed.  Copy for chart, and copy for patient signed, and dated. Patient/caregiver able to verbalize understanding.  Discharge Medication:  [name], [name]  Home Medication Instructions [id number]   Printed on:12/04/12 1318  Medication Information                    gabapentin (NEURONTIN) 300 MG capsule Take 300 mg by mouth 2 (two) times daily.             furosemide (LASIX) 80 MG tablet Take 80 mg by mouth daily.             labetalol (NORMODYNE) 300 MG tablet Take 300 mg by mouth 2 (two) times daily.            metoCLOPramide (REGLAN) 10 MG tablet Take 10 mg by mouth daily.            aspirin 81 MG tablet Take 81 mg by mouth daily.             omeprazole (PRILOSEC) 20 MG capsule Take 20 mg by mouth daily.             mycophenolate (CELLCEPT) 250 MG capsule Take 1,000 mg by mouth 2 (two) times daily.           isosorbide mononitrate (IMDUR) 30 MG 24 hr tablet Take 30 mg by mouth 2 (two) times daily.           predniSONE (DELTASONE) 5 MG tablet Take 5 mg by mouth daily.           amLODipine (NORVASC) 5 MG tablet Take 10 mg by mouth daily.           pravastatin (PRAVACHOL) 80 MG tablet Take 80 mg by mouth at bedtime.           cycloSPORINE modified (NEORAL) 25 MG capsule Take 50 mg by mouth 2 (two) times daily. In addition to 100mg  po twice daily for total dose of 150mg  twice daily.           cycloSPORINE modified (NEORAL) 100 MG capsule Take 100 mg by mouth 2 (two) times daily. In addition to 50mg  twice daily for total 150 mg twice daily.           fexofenadine (ALLEGRA) 180 MG tablet Take 180 mg by mouth daily as needed. For allergies           fluticasone (FLONASE) 50 MCG/ACT nasal spray Place 1 spray into the nose daily as needed. Allergies or sinus congestion.           acetaminophen (ARTHRITIS PAIN RELIEF) 650 MG CR  tablet Take 1,300 mg by mouth 2 (two) times daily.           lisinopril (PRINIVIL,ZESTRIL) 20 MG tablet Take 1 tablet (20 mg total) by mouth daily. Resume only on 12/07/12           sodium bicarbonate 650 MG tablet Take 1 tablet (650 mg total) by mouth 2 (two) times daily.           ciprofloxacin (CIPRO) 500 MG tablet Take 1 tablet (500 mg total) by mouth 2 (two) times daily.           insulin glargine (LANTUS) 100 UNIT/ML injection Inject 15 Units into the skin daily before breakfast.  albuterol (PROVENTIL HFA;VENTOLIN HFA) 108 (90 BASE) MCG/ACT inhaler Inhale 2 puffs into the lungs every 4 (four) hours as needed for wheezing.             Discharge Assessment: Filed Vitals:   12/04/12 1034  BP: 154/57  Pulse: 64  Temp: 98.5 F (36.9 C)  Resp:    Skin clean, dry and intact without evidence of skin break down, no evidence of skin tears noted. IV catheter discontinued intact. Site without signs and symptoms of complications - no redness or edema noted at insertion site, patient denies c/o pain - only slight tenderness at site.  Dressing with slight pressure applied.  D/c Instructions-Education: Discharge instructions given to patient/family with verbalized understanding. D/c education completed with patient/family including follow up instructions, medication list, d/c activities limitations if indicated, with other d/c instructions as indicated by MD - patient able to verbalize understanding, all questions fully answered. Patient instructed to return to ED, call 911, or call MD for any changes in condition.  Patient escorted via WC, and D/C home via private auto.  [name] Timothy Lose, RN 12/04/2012 1:18 PM

## 2012-12-04 NOTE — Progress Notes (Signed)
Patient ID: [name], male   DOB: [birth year], 62 y.o.   MRN: [id number]   [facility] Progress Note    Subjective:   Reports to be feeling better- some delay in lab draw today due to difficult stick   Objective:   BP 159/67  Pulse 69  Temp 98.7 F (37.1 C) (Oral)  Resp 18  Ht 6\' 2"  (1.88 m)  Wt 126.5 kg (278 lb 14.1 oz)  BMI 35.81 kg/m2  SpO2 96%  Intake/Output Summary (Last 24 hours) at 12/04/12 1011 Last data filed at 12/04/12 0900  Gross per 24 hour  Intake    360 ml  Output   2200 ml  Net  -1840 ml   Weight change:   Physical Exam: WUJ:[id number] sitting in recliner AOZ:HYQMV RRR, normal S1 and S2 Resp:Coarse BS, no rales/rhonchi HQI:ONGE, obese, NT, BS normal XBM:WUXLK LE edema  Imaging: No results found.  Labs: BMET  Lab 12/04/12 0838 12/03/12 0455 12/02/12 1004 12/01/12 0943 11/30/12 1323 11/30/12 0634 11/29/12 1917  NA 132* 131* 131* 129* 130* 133* 135  K 5.0 4.8 5.1 4.9 5.2* 5.0 4.8  CL 99 99 100 97 100 101 107  CO2 17* 13* 14* 14* 16* 17* 17*  GLUCOSE 243* 164* 138* 117* 126* 96 128*  BUN 70* 83* 87* 82* 76* 73* 62*  CREATININE 1.82* 2.65* 3.17* 3.29* 3.15* 3.21* 2.68*  ALB -- -- -- -- -- -- --  CALCIUM 9.2 8.7 8.3* 8.4 8.3* 8.4 6.9*  PHOS 3.2 4.0 -- -- -- -- --   CBC  Lab 12/03/12 0455 12/02/12 1004 12/01/12 0943 11/30/12 0634 11/28/12 1505  WBC 9.1 9.0 8.9 10.3 --  NEUTROABS -- -- -- -- 11.8*  HGB 8.8* 8.8* 9.0* 8.7* --  HCT 25.6* 25.9* 27.5* 26.7* --  MCV 79.3 80.7 81.6 83.2 --  PLT 181 174 153 139* --    Medications:       . albuterol  2.5 mg Nebulization BID  . amLODipine  5 mg Oral Daily  . aspirin  81 mg Oral Daily  . ceFEPime (MAXIPIME) IV  1 g Intravenous Q24H  . cycloSPORINE modified  150 mg Oral BID  . furosemide  80 mg Oral Daily  . gabapentin  300 mg Oral BID  . guaiFENesin  1,200 mg Oral BID  . heparin  5,000 Units Subcutaneous Q8H  . insulin aspart  0-9 Units Subcutaneous TID WC  .  ipratropium  0.5 mg Nebulization BID  . isosorbide dinitrate  30 mg Oral Daily  . labetalol  300 mg Oral BID  . metoCLOPramide  10 mg Oral Daily  . mycophenolate  1,000 mg Oral BID  . pantoprazole  40 mg Oral Daily  . pravastatin  40 mg Oral QHS  . predniSONE  5 mg Oral Q breakfast  . sodium bicarbonate  650 mg Oral TID  . sodium chloride  3 mL Intravenous Q12H     Assessment/ Plan:   1. Acute renal failure on chronic kidney disease stage III T. (ESRD status post LRD 9 years ago): Appears ischemic ATN from volume depletion with ongoing ACE-I and diuretics. Renal function now improving following brief plateau phase-consistent with ATN. No indication for allograft biopsy/clinical indication of acute rejection with improvement. Will set up labs follow up next week. 2. Acute Bronchitis: Clinically doing better on intravenous cefepime as well as bronchodilators.  3. Hypertension: resumed lasix- will need Lisinopril restarted as an OP in 3 days 4. Hyperkalemia: Likely  secondary to acute renal failure, improving with improving renal function.  5. Enterobacter bacteremia: on Cefepime- will be transitioned to PO ciprofloxacin at DC (500mg  daily) 6. New cardiomegaly: Diastolic CHF noted- improved on lasix   Zetta Bills, MD 12/04/2012, 10:11 AM

## 2012-12-04 NOTE — Discharge Summary (Signed)
PATIENT DETAILS Name: [name] Age: 62 y.o. Sex: male Date of Birth: 04-17-1951 MRN: [id number]. Admit Date: 11/28/2012 Admitting Physician: Clydia Llano, MD PCP:No primary provider on file.  Recommendations for Outpatient Follow-up:  1. Require chemistry checked within one week 2. Optimize glycemic control-may need to increase the dosing of Lantus  PRIMARY DISCHARGE DIAGNOSIS:  Principal Problem:  *Gram-negative bacteremia Active Problems:  DM 2  DIABETIC PERIPHERAL NEUROPATHY  Acute bronchitis  History of renal transplant  Hypoglycemia  Nausea and vomiting  Slurred speech  Metabolic acidosis      PAST MEDICAL HISTORY: Past Medical History  Diagnosis Date  . Diabetes mellitus   . Hypertension     DISCHARGE MEDICATIONS:   Medication List     As of 12/04/2012 11:23 AM    TAKE these medications         albuterol 108 (90 BASE) MCG/ACT inhaler   Commonly known as: PROVENTIL HFA;VENTOLIN HFA   Inhale 2 puffs into the lungs every 4 (four) hours as needed for wheezing.      amLODipine 5 MG tablet   Commonly known as: NORVASC   Take 10 mg by mouth daily.      ARTHRITIS PAIN RELIEF 650 MG CR tablet   Generic drug: acetaminophen   Take 1,300 mg by mouth 2 (two) times daily.      aspirin 81 MG tablet   Take 81 mg by mouth daily.      ciprofloxacin 500 MG tablet   Commonly known as: CIPRO   Take 1 tablet (500 mg total) by mouth 2 (two) times daily.      cycloSPORINE modified 25 MG capsule   Commonly known as: NEORAL   Take 50 mg by mouth 2 (two) times daily. In addition to 100mg  po twice daily for total dose of 150mg  twice daily.      cycloSPORINE modified 100 MG capsule   Commonly known as: NEORAL   Take 100 mg by mouth 2 (two) times daily. In addition to 50mg  twice daily for total 150 mg twice daily.      fexofenadine 180 MG tablet   Commonly known as: ALLEGRA   Take 180 mg by mouth daily as needed. For allergies      fluticasone 50 MCG/ACT nasal  spray   Commonly known as: FLONASE   Place 1 spray into the nose daily as needed. Allergies or sinus congestion.      furosemide 80 MG tablet   Commonly known as: LASIX   Take 80 mg by mouth daily.      gabapentin 300 MG capsule   Commonly known as: NEURONTIN   Take 300 mg by mouth 2 (two) times daily.      insulin glargine 100 UNIT/ML injection   Commonly known as: LANTUS   Inject 15 Units into the skin daily before breakfast.      isosorbide mononitrate 30 MG 24 hr tablet   Commonly known as: IMDUR   Take 30 mg by mouth 2 (two) times daily.      labetalol 300 MG tablet   Commonly known as: NORMODYNE   Take 300 mg by mouth 2 (two) times daily.      lisinopril 20 MG tablet   Commonly known as: PRINIVIL,ZESTRIL   Take 1 tablet (20 mg total) by mouth daily. Resume only on 12/07/12      metoCLOPramide 10 MG tablet   Commonly known as: REGLAN   Take 10 mg by mouth daily.  mycophenolate 250 MG capsule   Commonly known as: CELLCEPT   Take 1,000 mg by mouth 2 (two) times daily.      omeprazole 20 MG capsule   Commonly known as: PRILOSEC   Take 20 mg by mouth daily.      pravastatin 80 MG tablet   Commonly known as: PRAVACHOL   Take 80 mg by mouth at bedtime.      predniSONE 5 MG tablet   Commonly known as: DELTASONE   Take 5 mg by mouth daily.      sodium bicarbonate 650 MG tablet   Take 1 tablet (650 mg total) by mouth 2 (two) times daily.         BRIEF HPI:  See H&P, Labs, Consult and Test reports for all details in brief, [name] is a 62 y.o. male with past medical history of diabetes mellitus type 2 and hypertension. Patient has history of renal transplant in 2004. She came in to the hospital because of generalized weakness and slurred speech. Upon further evaluation in the emergency department, he was found to have acute on chronic renal failure and hypoglycemia. He was also found to be acidotic. He was then admitted to the hospitalist service for  further evaluation and treatment.  CONSULTATIONS:   Nephrology  PERTINENT RADIOLOGIC STUDIES: Dg Chest 2 View  11/28/2012  *RADIOLOGY REPORT*  Clinical Data: Weakness, shortness of breath, chest pain.  CHEST - 2 VIEW  Comparison: 09/28/2012  Findings: Moderate cardiomegaly, increased since prior study. Atheromatous aorta.  No focal infiltrate or effusion.  Some increase in central pulmonary vascular congestion.  Spurring in the lower thoracic spine.  IMPRESSION:  New cardiomegaly and central pulmonary vascular congestion.   Original Report Authenticated By: D. Andria Rhein, MD    US Renal  11/29/2012  *RADIOLOGY REPORT*  Clinical Data: Acute renal failure on chronic renal disease.  RENAL/URINARY TRACT ULTRASOUND COMPLETE  Comparison:  None.  Findings:  Right Kidney:  9.9 cm in length.  Renal cortical thinning and increased echogenicity consistent with chronic medical renal disease.  No hydronephrosis or mass.  Left Kidney:  9.8 cm in length.  Renal cortical thinning and increased echogenicity consistent with chronic medical renal disease.  No hydronephrosis or mass.  Right lower quadrant transplant kidney:  12.6 cm in length.  Normal renal cortical thickness and echogenicity without focal lesions or hydronephrosis.  No perinephric fluid collection.  Bladder:  Normal  IMPRESSION:  1.  Small echogenic native renal kidneys consistent with chronic medical renal disease. 2.  Right lower quadrant transplant kidney is normal.   Original Report Authenticated By: Rudie Meyer, M.D.    Dg Chest Port 1 View  12/02/2012  *RADIOLOGY REPORT*  Clinical Data: Short of breath  PORTABLE CHEST - 1 VIEW  Comparison: 12/01/2012.  Findings: The heart is mildly enlarged.  Pulmonary edema has improved.  Increased basilar atelectasis.  No pneumothorax.  IMPRESSION: Improved edema.  Increasing basilar volume loss.   Original Report Authenticated By: Jolaine Click, M.D.    Dg Chest Port 1 View  12/01/2012  *RADIOLOGY REPORT*   Clinical Data: Shortness of breath and wheezing.  PORTABLE CHEST - 1 VIEW  Comparison: 11/28/2012  Findings: 1014 hours. The cardiopericardial silhouette is enlarged. Vascular congestion with associated alveolar opacity having a lower lobe predominance. Telemetry leads overlie the chest.  IMPRESSION: Cardiomegaly with interstitial and basilar airspace opacity compatible with edema.   Original Report Authenticated By: Kennith Center, M.D.  PERTINENT LAB RESULTS: CBC:  Basename 12/03/12 0455 12/02/12 1004  WBC 9.1 9.0  HGB 8.8* 8.8*  HCT 25.6* 25.9*  PLT 181 174   CMET CMP     Component Value Date/Time   NA 132* 12/04/2012 0838   K 5.0 12/04/2012 0838   CL 99 12/04/2012 0838   CO2 17* 12/04/2012 0838   GLUCOSE 243* 12/04/2012 0838   BUN 70* 12/04/2012 0838   CREATININE 1.82* 12/04/2012 0838   CALCIUM 9.2 12/04/2012 0838   PROT 7.0 11/28/2012 1505   ALBUMIN 3.1* 12/04/2012 0838   AST 12 11/28/2012 1505   ALT 7 11/28/2012 1505   ALKPHOS 43 11/28/2012 1505   BILITOT 0.5 11/28/2012 1505   GFRNONAA 38* 12/04/2012 0838   GFRAA 44* 12/04/2012 0838    GFR Estimated Creatinine Clearance: 59.5 ml/min (by C-G formula based on Cr of 1.82). No results found for this basename: LIPASE:2,AMYLASE:2 in the last 72 hours No results found for this basename: CKTOTAL:3,CKMB:3,CKMBINDEX:3,TROPONINI:3 in the last 72 hours No components found with this basename: POCBNP:3 No results found for this basename: DDIMER:2 in the last 72 hours No results found for this basename: HGBA1C:2 in the last 72 hours No results found for this basename: CHOL:2,HDL:2,LDLCALC:2,TRIG:2,CHOLHDL:2,LDLDIRECT:2 in the last 72 hours No results found for this basename: TSH,T4TOTAL,FREET3,T3FREE,THYROIDAB in the last 72 hours No results found for this basename: VITAMINB12:2,FOLATE:2,FERRITIN:2,TIBC:2,IRON:2,RETICCTPCT:2 in the last 72 hours Coags: No results found for this basename: PT:2,INR:2 in the last 72 hours Microbiology: Recent  Results (from the past 240 hour(s))  URINE CULTURE     Status: Normal   Collection Time   11/28/12  4:45 PM      Component Value Range Status Comment   Specimen Description URINE, CLEAN CATCH   Final    Special Requests NONE   Final    Culture  Setup Time 11/28/2012 22:15   Final    Colony Count >=100,000 COLONIES/ML   Final    Culture ENTEROBACTER AEROGENES   Final    Report Status 11/30/2012 FINAL   Final    Organism ID, Bacteria ENTEROBACTER AEROGENES   Final   CULTURE, BLOOD (ROUTINE X 2)     Status: Normal   Collection Time   11/28/12  5:30 PM      Component Value Range Status Comment   Specimen Description BLOOD RIGHT HAND   Final    Special Requests BOTTLES DRAWN AEROBIC AND ANAEROBIC 10CC   Final    Culture  Setup Time 11/29/2012 02:16   Final    Culture     Final    Value: ENTEROBACTER AEROGENES     Note: Gram Stain Report Called to,Read Back By and Verified With: Rosalie Doctor RN @ [phone] 11/29/12 BY KRAWS   Report Status 12/01/2012 FINAL   Final    Organism ID, Bacteria ENTEROBACTER AEROGENES   Final   CULTURE, BLOOD (ROUTINE X 2)     Status: Normal   Collection Time   11/28/12  6:00 PM      Component Value Range Status Comment   Specimen Description BLOOD RIGHT ARM   Final    Special Requests BOTTLES DRAWN AEROBIC AND ANAEROBIC 10CC   Final    Culture  Setup Time 11/29/2012 02:16   Final    Culture     Final    Value: ENTEROBACTER AEROGENES     Note: SUSCEPTIBILITIES PERFORMED ON PREVIOUS CULTURE WITHIN THE LAST 5 DAYS.     Note: Gram Stain Report Called to,Read Back  By and Verified With: Rosalie Doctor RN @ [phone] 11/29/12 BY KRAWS   Report Status 12/01/2012 FINAL   Final   CLOSTRIDIUM DIFFICILE BY PCR     Status: Normal   Collection Time   12/01/12  6:04 AM      Component Value Range Status Comment   C difficile by pcr NEGATIVE  NEGATIVE Final      BRIEF HOSPITAL COURSE:   Principal Problem:  *Enterobacter bacteremia and UTI - On admission, was found to have fever  along with nausea and vomiting. A urinalysis was consistent with UTI. He subsequently urine cultures and blood cultures are positive for Enterobacter. On admission he also had some URI like symptoms and was empirically started on Rocephin and Zithromax. He continued to be persistently febrile during his early hospital stay, and was then switched over to cefepime. He has since then been maintained on cefepime to discharge. On discharge he will be transitioned to ciprofloxacin, he would need antibiotics for total of 14 days because of bacteremia and immunocompromise status. - Clinically is significantly better, now has been persistently afebrile for the past 2-3 days, he is symptomatically significantly better as well.  Active Problems: Hypoglycemia - He was initially noted to be hypoglycemic. Lantus was placed on hold. He was placed on sliding scale insulin. - He has no further episodes of hypoglycemia, his sugars are now in the 200s range. - On discharge she will be transitioned back to Lantus, but the dosing will be decreased to 15 units. Further optimization will need to be done in the outpatient setting.  Acute on chronic kidney disease - Was found to have acute on chronic kidney disease, patient is status post renal transplant and is on immunosuppressive therapy. He was hydrated. Nephrology was consulted. Peak creatinine during his hospital stay was 3.29, on discharge it is down to 1.82.  Metabolic acidosis - This is likely secondary to acute on chronic kidney failure. Nephrology recommends that we discharge this patient on sodium bicarbonate supplementation.  Hypertension - This was relatively well-controlled during his hospital stay. History resume all of his prior antihypertensive medications with the exception of lisinopril, nephrology recommends that this be restarted on 12/07/12. This was explained to the patient personally by me, he understands.  Nausea and vomiting - This is resolved,  this is secondary to UTI  Diarrhea - This was transient. This is resolved. C. difficile PCR was negative.  Acute diastolic heart failure - Patient was given Lasix. Echocardiogram showed normal systolic function. Chest x-ray was consistent with pulmonary edema. - He is clinically compensated.  TODAY-DAY OF DISCHARGE:  Subjective:   [name] today has no headache,no chest abdominal pain,no new weakness tingling or numbness, feels much better wants to go home today.   Objective:   Blood pressure 154/57, pulse 64, temperature 98.5 F (36.9 C), temperature source Oral, resp. rate 18, height 6\' 2"  (1.88 m), weight 126.5 kg (278 lb 14.1 oz), SpO2 98.00%.  Intake/Output Summary (Last 24 hours) at 12/04/12 1123 Last data filed at 12/04/12 0900  Gross per 24 hour  Intake    360 ml  Output   2200 ml  Net  -1840 ml    Exam Awake Alert, Oriented *3, No new F.N deficits, Normal affect .AT,PERRAL Supple Neck,No JVD, No cervical lymphadenopathy appriciated.  Symmetrical Chest wall movement, Good air movement bilaterally, CTAB RRR,No Gallops,Rubs or new Murmurs, No Parasternal Heave +ve B.Sounds, Abd Soft, Non tender, No organomegaly appriciated, No rebound -guarding or rigidity. No  Cyanosis, Clubbing or edema, No new Rash or bruise  DISCHARGE CONDITION: Stable  DISPOSITION: HOME  DISCHARGE INSTRUCTIONS:    Activity:  As tolerated with Full fall precautions use walker/cane & assistance as needed  Diet recommendation: Diabetic Diet Heart Healthy diet      Follow-up Information    Follow up with COLADONATO,JOSEPH A, MD. Schedule an appointment as soon as possible for a visit in 1 week.   Contact information:   [address]  [facility] [place] [address] [phone]          Total Time spent on discharge equals 45 minutes.  SignedJeoffrey Massed 12/04/2012 11:23 AM

## 2012-12-04 NOTE — Progress Notes (Signed)
12/04/2012 1830 Faxed referral to [facility] for scheduled d/c home today. Christoper Allegra has referral for [facility].  Pt will follow up with [facility] on Monday at [facility].  Isidoro Donning RN CCM Case Mgmt phone [phone]

## 2012-12-04 NOTE — Progress Notes (Signed)
Resp Care Note; Pt wearing home CPAP unit for hs.

## 2013-01-02 ENCOUNTER — Observation Stay (HOSPITAL_COMMUNITY): Payer: Medicare HMO

## 2013-01-02 ENCOUNTER — Emergency Department (HOSPITAL_COMMUNITY): Payer: Medicare HMO

## 2013-01-02 ENCOUNTER — Encounter (HOSPITAL_COMMUNITY): Payer: Self-pay | Admitting: Emergency Medicine

## 2013-01-02 ENCOUNTER — Inpatient Hospital Stay (HOSPITAL_COMMUNITY)
Admission: EM | Admit: 2013-01-02 | Discharge: 2013-01-05 | DRG: 872 | Disposition: A | Discharge status: Home Health | Payer: Medicare HMO | Attending: Internal Medicine | Admitting: Internal Medicine

## 2013-01-02 DIAGNOSIS — J209 Acute bronchitis, unspecified: Secondary | ICD-10-CM

## 2013-01-02 DIAGNOSIS — E860 Dehydration: Secondary | ICD-10-CM | POA: Diagnosis present

## 2013-01-02 DIAGNOSIS — D72829 Elevated white blood cell count, unspecified: Secondary | ICD-10-CM | POA: Diagnosis present

## 2013-01-02 DIAGNOSIS — B9689 Other specified bacterial agents as the cause of diseases classified elsewhere: Secondary | ICD-10-CM | POA: Diagnosis present

## 2013-01-02 DIAGNOSIS — N400 Enlarged prostate without lower urinary tract symptoms: Secondary | ICD-10-CM | POA: Diagnosis present

## 2013-01-02 DIAGNOSIS — E119 Type 2 diabetes mellitus without complications: Secondary | ICD-10-CM

## 2013-01-02 DIAGNOSIS — L0201 Cutaneous abscess of face: Secondary | ICD-10-CM

## 2013-01-02 DIAGNOSIS — G4730 Sleep apnea, unspecified: Secondary | ICD-10-CM

## 2013-01-02 DIAGNOSIS — Z7982 Long term (current) use of aspirin: Secondary | ICD-10-CM

## 2013-01-02 DIAGNOSIS — IMO0002 Reserved for concepts with insufficient information to code with codable children: Secondary | ICD-10-CM

## 2013-01-02 DIAGNOSIS — I10 Essential (primary) hypertension: Secondary | ICD-10-CM

## 2013-01-02 DIAGNOSIS — A419 Sepsis, unspecified organism: Principal | ICD-10-CM | POA: Diagnosis present

## 2013-01-02 DIAGNOSIS — R6510 Systemic inflammatory response syndrome (SIRS) of non-infectious origin without acute organ dysfunction: Secondary | ICD-10-CM

## 2013-01-02 DIAGNOSIS — Z794 Long term (current) use of insulin: Secondary | ICD-10-CM

## 2013-01-02 DIAGNOSIS — R7881 Bacteremia: Secondary | ICD-10-CM

## 2013-01-02 DIAGNOSIS — R4781 Slurred speech: Secondary | ICD-10-CM

## 2013-01-02 DIAGNOSIS — I517 Cardiomegaly: Secondary | ICD-10-CM

## 2013-01-02 DIAGNOSIS — E872 Acidosis, unspecified: Secondary | ICD-10-CM

## 2013-01-02 DIAGNOSIS — N390 Urinary tract infection, site not specified: Secondary | ICD-10-CM

## 2013-01-02 DIAGNOSIS — R509 Fever, unspecified: Secondary | ICD-10-CM

## 2013-01-02 DIAGNOSIS — R112 Nausea with vomiting, unspecified: Secondary | ICD-10-CM

## 2013-01-02 DIAGNOSIS — Z79899 Other long term (current) drug therapy: Secondary | ICD-10-CM

## 2013-01-02 DIAGNOSIS — E162 Hypoglycemia, unspecified: Secondary | ICD-10-CM

## 2013-01-02 DIAGNOSIS — Z940 Kidney transplant status: Secondary | ICD-10-CM

## 2013-01-02 DIAGNOSIS — R6520 Severe sepsis without septic shock: Secondary | ICD-10-CM | POA: Diagnosis present

## 2013-01-02 DIAGNOSIS — N179 Acute kidney failure, unspecified: Secondary | ICD-10-CM

## 2013-01-02 DIAGNOSIS — E1149 Type 2 diabetes mellitus with other diabetic neurological complication: Secondary | ICD-10-CM

## 2013-01-02 LAB — CBC WITH DIFFERENTIAL/PLATELET
Basophils Absolute: 0 10*3/uL (ref 0.0–0.1)
Eosinophils Absolute: 0 10*3/uL (ref 0.0–0.7)
Lymphs Abs: 1.1 10*3/uL (ref 0.7–4.0)
MCHC: 32.6 g/dL (ref 30.0–36.0)
MCV: 84.7 fL (ref 78.0–100.0)
Monocytes Absolute: 1.5 10*3/uL — ABNORMAL HIGH (ref 0.1–1.0)
Monocytes Relative: 14 % — ABNORMAL HIGH (ref 3–12)
Neutro Abs: 8 10*3/uL — ABNORMAL HIGH (ref 1.7–7.7)
Platelets: 152 10*3/uL (ref 150–400)
RDW: 14.7 % (ref 11.5–15.5)
WBC: 10.6 10*3/uL — ABNORMAL HIGH (ref 4.0–10.5)

## 2013-01-02 LAB — URINE MICROSCOPIC-ADD ON

## 2013-01-02 LAB — URINALYSIS, ROUTINE W REFLEX MICROSCOPIC
Bilirubin Urine: NEGATIVE
Ketones, ur: NEGATIVE mg/dL
Nitrite: NEGATIVE
Protein, ur: 100 mg/dL — AB
Urobilinogen, UA: 0.2 mg/dL (ref 0.0–1.0)

## 2013-01-02 LAB — PROCALCITONIN: Procalcitonin: 0.44 ng/mL

## 2013-01-02 LAB — BASIC METABOLIC PANEL
BUN: 37 mg/dL — ABNORMAL HIGH (ref 6–23)
Calcium: 8.4 mg/dL (ref 8.4–10.5)
Chloride: 103 mEq/L (ref 96–112)
Creatinine, Ser: 2.35 mg/dL — ABNORMAL HIGH (ref 0.50–1.35)
GFR calc Af Amer: 32 mL/min — ABNORMAL LOW (ref >90)
GFR calc non Af Amer: 28 mL/min — ABNORMAL LOW (ref >90)

## 2013-01-02 LAB — CREATININE, SERUM
GFR calc Af Amer: 31 mL/min — ABNORMAL LOW (ref >90)
GFR calc non Af Amer: 27 mL/min — ABNORMAL LOW (ref >90)

## 2013-01-02 LAB — POCT I-STAT TROPONIN I: Troponin i, poc: 0.03 ng/mL (ref 0.00–0.08)

## 2013-01-02 LAB — CBC
HCT: 28.5 % — ABNORMAL LOW (ref 39.0–52.0)
Hemoglobin: 9.2 g/dL — ABNORMAL LOW (ref 13.0–17.0)
RBC: 3.37 MIL/uL — ABNORMAL LOW (ref 4.22–5.81)
WBC: 9.2 10*3/uL (ref 4.0–10.5)

## 2013-01-02 MED ORDER — ACETAMINOPHEN 650 MG RE SUPP: 650.0000 mg | Freq: Four times a day (QID) | RECTAL | Status: DC | PRN

## 2013-01-02 MED ORDER — SODIUM BICARBONATE 650 MG PO TABS
650.0000 mg | ORAL_TABLET | Freq: Two times a day (BID) | ORAL | Status: DC
  Administered 2013-01-02 – 2013-01-05 (×6): 650 mg via ORAL
  Filled 2013-01-02 (×7): qty 1

## 2013-01-02 MED ORDER — PIPERACILLIN-TAZOBACTAM 3.375 G IVPB
3.3750 g | Freq: Three times a day (TID) | INTRAVENOUS | Status: DC
  Administered 2013-01-02 – 2013-01-05 (×9): 3.375 g via INTRAVENOUS
  Filled 2013-01-02 (×12): qty 50

## 2013-01-02 MED ORDER — SODIUM CHLORIDE 0.9 % IV SOLN
INTRAVENOUS | Status: DC
  Administered 2013-01-02: 17:00:00 via INTRAVENOUS

## 2013-01-02 MED ORDER — VANCOMYCIN HCL 10 G IV SOLR
1500.0000 mg | INTRAVENOUS | Status: DC
  Filled 2013-01-02: qty 1500

## 2013-01-02 MED ORDER — MYCOPHENOLATE MOFETIL 250 MG PO CAPS
1000.0000 mg | ORAL_CAPSULE | Freq: Two times a day (BID) | ORAL | Status: DC
  Administered 2013-01-02 – 2013-01-05 (×6): 1000 mg via ORAL
  Filled 2013-01-02 (×7): qty 4

## 2013-01-02 MED ORDER — LORATADINE 10 MG PO TABS
10.0000 mg | ORAL_TABLET | Freq: Every day | ORAL | Status: DC
  Administered 2013-01-02 – 2013-01-05 (×4): 10 mg via ORAL
  Filled 2013-01-02 (×4): qty 1

## 2013-01-02 MED ORDER — INSULIN ASPART 100 UNIT/ML ~~LOC~~ SOLN: 0.0000 [IU] | Freq: Every day | SUBCUTANEOUS | Status: DC

## 2013-01-02 MED ORDER — ACETAMINOPHEN 325 MG PO TABS: 650.0000 mg | ORAL_TABLET | Freq: Four times a day (QID) | ORAL | Status: DC | PRN

## 2013-01-02 MED ORDER — ONDANSETRON HCL 4 MG PO TABS
4.0000 mg | ORAL_TABLET | Freq: Four times a day (QID) | ORAL | Status: DC | PRN
  Administered 2013-01-02 – 2013-01-03 (×2): 4 mg via ORAL
  Filled 2013-01-02 (×2): qty 1

## 2013-01-02 MED ORDER — SODIUM CHLORIDE 0.9 % IJ SOLN
3.0000 mL | Freq: Two times a day (BID) | INTRAMUSCULAR | Status: DC
  Administered 2013-01-02 – 2013-01-05 (×4): 3 mL via INTRAVENOUS

## 2013-01-02 MED ORDER — ONDANSETRON HCL 4 MG/2ML IJ SOLN: 4.0000 mg | Freq: Four times a day (QID) | INTRAMUSCULAR | Status: DC | PRN

## 2013-01-02 MED ORDER — GABAPENTIN 300 MG PO CAPS
300.0000 mg | ORAL_CAPSULE | Freq: Two times a day (BID) | ORAL | Status: DC
  Administered 2013-01-02 – 2013-01-05 (×6): 300 mg via ORAL
  Filled 2013-01-02 (×8): qty 1

## 2013-01-02 MED ORDER — HEPARIN SODIUM (PORCINE) 5000 UNIT/ML IJ SOLN
5000.0000 [IU] | Freq: Three times a day (TID) | INTRAMUSCULAR | Status: DC
  Administered 2013-01-02 – 2013-01-05 (×8): 5000 [IU] via SUBCUTANEOUS
  Filled 2013-01-02 (×11): qty 1

## 2013-01-02 MED ORDER — FLUTICASONE PROPIONATE 50 MCG/ACT NA SUSP
1.0000 | Freq: Every day | NASAL | Status: DC | PRN
  Filled 2013-01-02: qty 16

## 2013-01-02 MED ORDER — HYDROCODONE-ACETAMINOPHEN 5-325 MG PO TABS: 1.0000 | ORAL_TABLET | ORAL | Status: DC | PRN

## 2013-01-02 MED ORDER — ATORVASTATIN CALCIUM 80 MG PO TABS
80.0000 mg | ORAL_TABLET | Freq: Every day | ORAL | Status: DC
  Administered 2013-01-02: 80 mg via ORAL
  Filled 2013-01-02 (×2): qty 1

## 2013-01-02 MED ORDER — HYDROMORPHONE HCL PF 1 MG/ML IJ SOLN: 1.0000 mg | INTRAMUSCULAR | Status: DC | PRN

## 2013-01-02 MED ORDER — VANCOMYCIN HCL IN DEXTROSE 1-5 GM/200ML-% IV SOLN
1000.0000 mg | Freq: Once | INTRAVENOUS | Status: AC
  Administered 2013-01-02: 1000 mg via INTRAVENOUS
  Filled 2013-01-02: qty 200

## 2013-01-02 MED ORDER — HYDROCORTISONE SOD SUCCINATE 100 MG IJ SOLR
100.0000 mg | Freq: Three times a day (TID) | INTRAMUSCULAR | Status: DC
  Administered 2013-01-02 – 2013-01-04 (×6): 100 mg via INTRAVENOUS
  Filled 2013-01-02 (×9): qty 2

## 2013-01-02 MED ORDER — ALUM & MAG HYDROXIDE-SIMETH 200-200-20 MG/5ML PO SUSP: 30.0000 mL | Freq: Four times a day (QID) | ORAL | Status: DC | PRN

## 2013-01-02 MED ORDER — SODIUM CHLORIDE 0.9 % IV SOLN
Freq: Once | INTRAVENOUS | Status: AC
  Administered 2013-01-02: 14:00:00 via INTRAVENOUS

## 2013-01-02 MED ORDER — SODIUM CHLORIDE 0.9 % IV SOLN
INTRAVENOUS | Status: DC
  Administered 2013-01-03: via INTRAVENOUS

## 2013-01-02 MED ORDER — ACETAMINOPHEN 325 MG PO TABS
650.0000 mg | ORAL_TABLET | Freq: Once | ORAL | Status: AC
  Administered 2013-01-02: 650 mg via ORAL
  Filled 2013-01-02: qty 2

## 2013-01-02 MED ORDER — AZELASTINE HCL 0.05 % OP SOLN: 1.0000 [drp] | Freq: Two times a day (BID) | OPHTHALMIC | Status: DC

## 2013-01-02 MED ORDER — INSULIN GLARGINE 100 UNIT/ML ~~LOC~~ SOLN
15.0000 [IU] | Freq: Every day | SUBCUTANEOUS | Status: DC
  Administered 2013-01-03 – 2013-01-04 (×2): 15 [IU] via SUBCUTANEOUS

## 2013-01-02 MED ORDER — CYCLOSPORINE MODIFIED (NEORAL) 100 MG PO CAPS: 100.0000 mg | ORAL_CAPSULE | Freq: Two times a day (BID) | ORAL | Status: DC

## 2013-01-02 MED ORDER — ALBUTEROL SULFATE (5 MG/ML) 0.5% IN NEBU: 2.5000 mg | INHALATION_SOLUTION | RESPIRATORY_TRACT | Status: DC | PRN

## 2013-01-02 MED ORDER — INSULIN ASPART 100 UNIT/ML ~~LOC~~ SOLN
0.0000 [IU] | Freq: Three times a day (TID) | SUBCUTANEOUS | Status: DC
  Administered 2013-01-03: 8 [IU] via SUBCUTANEOUS

## 2013-01-02 MED ORDER — CYCLOSPORINE MODIFIED (NEORAL) 25 MG PO CAPS
150.0000 mg | ORAL_CAPSULE | Freq: Two times a day (BID) | ORAL | Status: DC
Start: 2013-01-02 — End: 2013-01-05
  Administered 2013-01-02 – 2013-01-05 (×6): 150 mg via ORAL
  Filled 2013-01-02 (×7): qty 6

## 2013-01-02 MED ORDER — ASPIRIN EC 81 MG PO TBEC
81.0000 mg | DELAYED_RELEASE_TABLET | Freq: Every day | ORAL | Status: DC
  Administered 2013-01-02 – 2013-01-05 (×4): 81 mg via ORAL
  Filled 2013-01-02 (×4): qty 1

## 2013-01-02 MED ORDER — ALBUTEROL SULFATE HFA 108 (90 BASE) MCG/ACT IN AERS: 2.0000 | INHALATION_SPRAY | RESPIRATORY_TRACT | Status: DC | PRN

## 2013-01-02 NOTE — H&P (Signed)
History and Physical       Hospital Admission Note Date: 01/02/2013  Patient name: [name] Medical record number: [id number] Date of birth: Jul 23, 1951 Age: 62 y.o. Gender: male PCP: No primary provider on file.    Chief Complaint:  Nausea vomiting with generalized weakness for last 2-3 days  HPI: Patient is a 62 year old male with a history of kidney transplant, diabetes, hypertension presented to ED with chief complaints of nausea, vomiting and generalized weakness. Patient states that he started having the symptoms 2 days ago. Patient was in hospital in January 2014 and he was found to have Enterobacter bacteremia and was thought from UTI, he has completed a course of ciprofloxacin. Patient states that he has not been able to hold food down in the last 2 days and has been on a liquid diet. He denies any symptoms of dysuria, hematuria, urinary retention. In the ED patient was noticed to have fever of 102, hypotensive with SBP 109/46, pulse of 79. UA was positive for UTI. At the time of previous discharge on 12/04/2012 his creatinine was 1.8., today 2.35. Per patient, he had vague abdominal pain   Review of Systems:  Constitutional: + fever, chills, diaphoresis, poor appetite change and increased fatigue.  HEENT: Denies photophobia, eye pain, redness, hearing loss, ear pain, congestion, sore throat, rhinorrhea, sneezing, mouth sores, trouble swallowing, neck pain, neck stiffness and tinnitus.   Respiratory: Denies SOB, DOE, cough, chest tightness,  and wheezing.   Cardiovascular: Denies chest pain, palpitations and leg swelling.  Gastrointestinal: Please see history of present illness   Genitourinary: Denies dysuria, urgency, frequency, hematuria, flank pain and difficulty urinating.  Musculoskeletal: Denies myalgias, back pain, joint swelling, arthralgias and gait problem.  Skin: Denies pallor, rash and wound.  Neurological:  Denies seizures, syncope, numbness and headaches. + dizziness, lightheadedness with generalized weakness Hematological: Denies adenopathy. Easy bruising, personal or family bleeding history  Psychiatric/Behavioral: Denies suicidal ideation, mood changes, confusion, nervousness, sleep disturbance and agitation  Past Medical History: Past Medical History  Diagnosis Date  . Diabetes mellitus   . Hypertension    Past Surgical History  Procedure Laterality Date  . Kidney transplant      Medications: Prior to Admission medications   Medication Sig Start Date End Date Taking? Authorizing Provider  acetaminophen (ARTHRITIS PAIN RELIEF) 650 MG CR tablet Take 650-1,300 mg by mouth 2 (two) times daily as needed (arthritis).    Yes Historical Provider, MD  amLODipine (NORVASC) 5 MG tablet Take 10 mg by mouth daily.   Yes Historical Provider, MD  aspirin EC 81 MG tablet Take 81 mg by mouth daily.   Yes Historical Provider, MD  atorvastatin (LIPITOR) 80 MG tablet Take 80 mg by mouth at bedtime.   Yes Historical Provider, MD  azelastine (OPTIVAR) 0.05 % ophthalmic solution Place 1 drop into both eyes 2 (two) times daily.   Yes Historical Provider, MD  capsaicin (ZOSTRIX) 0.025 % cream Apply 1 application topically 4 (four) times daily - after meals and at bedtime.   Yes Historical Provider, MD  cycloSPORINE modified (NEORAL) 100 MG capsule Take 100 mg by mouth 2 (two) times daily. In addition to 50mg  twice daily for total 150 mg twice daily.   Yes Historical Provider, MD  cycloSPORINE modified (NEORAL) 25 MG capsule Take 50 mg by mouth 2 (two) times daily. In addition to 100mg  po twice daily for total dose of 150mg  twice daily.   Yes Historical Provider, MD  fexofenadine (ALLEGRA) 180 MG tablet  Take 180 mg by mouth daily as needed. For allergies   Yes Historical Provider, MD  fluticasone (FLONASE) 50 MCG/ACT nasal spray Place 1 spray into the nose daily as needed. Allergies or sinus congestion.   Yes  Historical Provider, MD  furosemide (LASIX) 80 MG tablet Take 80 mg by mouth daily.     Yes Historical Provider, MD  gabapentin (NEURONTIN) 300 MG capsule Take 300 mg by mouth 2 (two) times daily.     Yes Historical Provider, MD  insulin glargine (LANTUS) 100 UNIT/ML injection Inject 15 Units into the skin daily before breakfast. 12/04/12  Yes Shanker Levora Dredge, MD  isosorbide mononitrate (IMDUR) 30 MG 24 hr tablet Take 30 mg by mouth 2 (two) times daily.   Yes Historical Provider, MD  labetalol (NORMODYNE) 300 MG tablet Take 300 mg by mouth 2 (two) times daily.    Yes Historical Provider, MD  lisinopril (PRINIVIL,ZESTRIL) 20 MG tablet Take 1 tablet (20 mg total) by mouth daily. Resume only on 12/07/12 12/04/12  Yes Shanker Levora Dredge, MD  metoCLOPramide (REGLAN) 10 MG tablet Take 10 mg by mouth daily.    Yes Historical Provider, MD  mycophenolate (CELLCEPT) 250 MG capsule Take 1,000 mg by mouth 2 (two) times daily.   Yes Historical Provider, MD  omeprazole (PRILOSEC) 20 MG capsule Take 20 mg by mouth daily.     Yes Historical Provider, MD  pravastatin (PRAVACHOL) 80 MG tablet Take 80 mg by mouth at bedtime.   Yes Historical Provider, MD  predniSONE (DELTASONE) 5 MG tablet Take 5 mg by mouth daily.   Yes Historical Provider, MD  sodium bicarbonate 650 MG tablet Take 1 tablet (650 mg total) by mouth 2 (two) times daily. 12/04/12  Yes Shanker Levora Dredge, MD  albuterol (PROVENTIL HFA;VENTOLIN HFA) 108 (90 BASE) MCG/ACT inhaler Inhale 2 puffs into the lungs every 4 (four) hours as needed for wheezing. 12/04/12   Shanker Levora Dredge, MD    Allergies:   Allergies  Allergen Reactions  . Iodine Anaphylaxis    Iv dye  . Shellfish Allergy Anaphylaxis and Swelling    Has throat swelling, tongue swelling, and entire body swells up.     Social History:  reports that he has quit smoking. He has never used smokeless tobacco. He reports that he does not drink alcohol or use illicit drugs.   Family  History: History reviewed. No pertinent family history.  Physical Exam: Blood pressure 109/26, pulse 79, temperature 101.6 F (38.7 C), temperature source Rectal, resp. rate 20, SpO2 100.00%. General: Alert, awake, oriented x3, in no acute distress. HEENT: normocephalic, atraumatic, anicteric sclera, pink conjunctiva, pupils equal and reactive to light and accomodation, oropharynx clear, dry mucosal membranes Neck: supple, no masses or lymphadenopathy, no goiter, no bruits  Heart: Regular rate and rhythm, without murmurs, rubs or gallops. Lungs: Clear to auscultation bilaterally, no wheezing, rales or rhonchi. Abdomen: Soft, nontender, nondistended, positive bowel sounds, no masses. Extremities: No clubbing, cyanosis or edema with positive pedal pulses. Neuro: Grossly intact, no focal neurological deficits, strength 5/5 upper and lower extremities bilaterally Psych: alert and oriented x 3, normal mood and affect Skin: no rashes or lesions, warm and dry   LABS on Admission:  Basic Metabolic Panel:  Recent Labs Lab 01/02/13 1109  NA 137  K 3.9  CL 103  CO2 20  GLUCOSE 90  BUN 37*  CREATININE 2.35*  CALCIUM 8.4   CBC:  Recent Labs Lab 01/02/13 1109  WBC 10.6*  NEUTROABS  8.0*  HGB 10.3*  HCT 31.6*  MCV 84.7  PLT 152    Radiological Exams on Admission: Dg Chest 2 View  01/02/2013  *RADIOLOGY REPORT*  Clinical Data: Chest pain.  CHEST - 2 VIEW  Comparison: 12/02/2012  Findings: Cardiomegaly.  No effusions or edema.  Previously noted in edema has resolved.  Minimal right basilar density may reflect atelectasis. No acute bony abnormality.  IMPRESSION: Resolution of previously noted edema.  Minimal right base density, likely atelectasis.   Original Report Authenticated By: Charlett Nose, M.D.     Assessment/Plan Principal Problem:   SIRS (systemic inflammatory response syndrome) with UTI, ARF and dehydration: - Patient has been receiving IV fluids in the ED, appears  somewhat stable at this moment, will admit to telemetry, - Obtain blood cultures x2, urine cultures, procalcitonin, lactic acid, cortisol level - Patient recently had Enterobacter bacteremia sensitive to Zosyn, place on broad spectrum antibiotics vancomycin and Zosyn. Follow repeat blood cultures. - Hold all antihypertensives, continue IV fluid hydration - Patient has been on maintenance prednisone due to history of renal transplant, I will place him on IV hydrocortisone for now. Patient will need to be restarted on his maintenance dose of prednisone 5 mg daily once medically stable.   Active Problems:   DM 2 - Obtain HbA1c, place on Lantus and sliding scale insulin     RENAL FAILURE, ACUTE: Likely secondary to nausea vomiting and dehydration, UTI - Hold Lasix, lisinopril and all antihypertensives - Treat UTI, obtain renal ultrasound    History of renal transplant - Continue immunosuppressants, currently on hydrocortisone    Nausea and vomiting - Place on antiemetics, IV hydration, full liquid diet until he is able to tolerate solids    UTI (urinary tract infection) - Placed on vancomycin and Zosyn, follow urine cultures  DVT prophylaxis: heparin sq  CODE STATUS: full code  Further plan will depend as patient's clinical course evolves and further radiologic and laboratory data become available.   Time Spent on Admission: 1 hour  Karey Suthers M.D. [facility] 01/02/2013, 2:58 PM Pager: [phone]  If 7PM-7AM, please contact night-coverage [email or web address] Password [email or web address]

## 2013-01-02 NOTE — ED Notes (Signed)
Patient transported to X-ray 

## 2013-01-02 NOTE — Progress Notes (Signed)
ANTIBIOTIC CONSULT NOTE - INITIAL  Pharmacy Consult for vancomycin/zosyn Indication: immunocompromised  Allergies  Allergen Reactions  . Iodine Anaphylaxis    Iv dye  . Shellfish Allergy Anaphylaxis and Swelling    Has throat swelling, tongue swelling, and entire body swells up.     Patient Measurements:    Vital Signs: Temp: 102.1 F (38.9 C) (02/16 1130) Temp src: Rectal (02/16 1130) BP: 152/52 mmHg (02/16 1033) Pulse Rate: 86 (02/16 1033) Intake/Output from previous day:   Intake/Output from this shift:    Labs:  Recent Labs  01/02/13 1109  WBC 10.6*  HGB 10.3*  PLT 152  CREATININE 2.35*   The CrCl is unknown because both a height and weight (above a minimum accepted value) are required for this calculation. No results found for this basename: VANCOTROUGH, VANCOPEAK, VANCORANDOM, GENTTROUGH, GENTPEAK, GENTRANDOM, TOBRATROUGH, TOBRAPEAK, TOBRARND, AMIKACINPEAK, AMIKACINTROU, AMIKACIN,  in the last 72 hours   Microbiology: No results found for this or any previous visit (from the past 720 hour(s)).  Medical History: Past Medical History  Diagnosis Date  . Diabetes mellitus   . Hypertension     Medications:   (Not in a hospital admission) Scheduled:  . [COMPLETED] acetaminophen  650 mg Oral Once   Assessment: 62 y.o male with ESRD s/p kidney transplant 2004 admitted with generalized weakness and nausea to start on empiric vanc and zosyn. Scr ~ 2.35 WBC 10.6 afeb.  Goal of Therapy:  Vancomycin trough level 15-20 mcg/ml  Plan:  1. Will Give Vancomycin 1000 mg IV load x1 then 1500 mg q24hours 2. Initiate Zosyn 3.375q IV q8h 3. Follow up renal function and clinical progression  Bola A. Wandra Feinstein D Clinical Pharmacist Pager:[phone] Phone [phone] 01/02/2013 1:42 PM

## 2013-01-02 NOTE — ED Provider Notes (Signed)
History     CSN: [id number]  Arrival date & time 01/02/13  1030   First MD Initiated Contact with Patient 01/02/13 1045      Chief Complaint  Patient presents with  . Weakness  . Nausea    (Consider location/radiation/quality/duration/timing/severity/associated sxs/prior treatment) HPI Comments: This is a 62 year old male, past medical history remarkable for kidney transplant, diabetes, and hypertension who presents emergency department with chief complaint of vomiting and generalized weakness. Patient states that he began vomiting 2 days ago. He states that he has had progressive generalized weakness. Patient states that his strength has been leaving him to the point to where he fell today. States that yesterday he had an episode of chest pain, which did not radiate, and was not associated with diaphoresis or shortness of breath. Patient has not tried taking anything to alleviate his symptoms. Nothing makes his symptoms better or worse.   The history is provided by the patient. No language interpreter was used.    Past Medical History  Diagnosis Date  . Diabetes mellitus   . Hypertension     Past Surgical History  Procedure Laterality Date  . Kidney transplant      History reviewed. No pertinent family history.  History  Substance Use Topics  . Smoking status: Former Games developer  . Smokeless tobacco: Never Used  . Alcohol Use: No      Review of Systems  All other systems reviewed and are negative.    Allergies  Iodine and Shellfish allergy  Home Medications   Current Outpatient Rx  Name  Route  Sig  Dispense  Refill  . acetaminophen (ARTHRITIS PAIN RELIEF) 650 MG CR tablet   Oral   Take 1,300 mg by mouth 2 (two) times daily.         Marland Kitchen albuterol (PROVENTIL HFA;VENTOLIN HFA) 108 (90 BASE) MCG/ACT inhaler   Inhalation   Inhale 2 puffs into the lungs every 4 (four) hours as needed for wheezing.   1 Inhaler   0   . amLODipine (NORVASC) 5 MG tablet   Oral  Take 10 mg by mouth daily.         Marland Kitchen aspirin 81 MG tablet   Oral   Take 81 mg by mouth daily.           . ciprofloxacin (CIPRO) 500 MG tablet   Oral   Take 1 tablet (500 mg total) by mouth 2 (two) times daily.   16 tablet   0   . cycloSPORINE modified (NEORAL) 100 MG capsule   Oral   Take 100 mg by mouth 2 (two) times daily. In addition to 50mg  twice daily for total 150 mg twice daily.         . cycloSPORINE modified (NEORAL) 25 MG capsule   Oral   Take 50 mg by mouth 2 (two) times daily. In addition to 100mg  po twice daily for total dose of 150mg  twice daily.         . fexofenadine (ALLEGRA) 180 MG tablet   Oral   Take 180 mg by mouth daily as needed. For allergies         . fluticasone (FLONASE) 50 MCG/ACT nasal spray   Nasal   Place 1 spray into the nose daily as needed. Allergies or sinus congestion.         . furosemide (LASIX) 80 MG tablet   Oral   Take 80 mg by mouth daily.           Marland Kitchen  gabapentin (NEURONTIN) 300 MG capsule   Oral   Take 300 mg by mouth 2 (two) times daily.           . insulin glargine (LANTUS) 100 UNIT/ML injection   Subcutaneous   Inject 15 Units into the skin daily before breakfast.   10 mL   0   . isosorbide mononitrate (IMDUR) 30 MG 24 hr tablet   Oral   Take 30 mg by mouth 2 (two) times daily.         Marland Kitchen labetalol (NORMODYNE) 300 MG tablet   Oral   Take 300 mg by mouth 2 (two) times daily.          Marland Kitchen lisinopril (PRINIVIL,ZESTRIL) 20 MG tablet   Oral   Take 1 tablet (20 mg total) by mouth daily. Resume only on 12/07/12         . metoCLOPramide (REGLAN) 10 MG tablet   Oral   Take 10 mg by mouth daily.          . mycophenolate (CELLCEPT) 250 MG capsule   Oral   Take 1,000 mg by mouth 2 (two) times daily.         Marland Kitchen omeprazole (PRILOSEC) 20 MG capsule   Oral   Take 20 mg by mouth daily.           . pravastatin (PRAVACHOL) 80 MG tablet   Oral   Take 80 mg by mouth at bedtime.         . predniSONE  (DELTASONE) 5 MG tablet   Oral   Take 5 mg by mouth daily.         . sodium bicarbonate 650 MG tablet   Oral   Take 1 tablet (650 mg total) by mouth 2 (two) times daily.   30 tablet   0     BP 152/52  Pulse 86  Temp(Src) 99.6 F (37.6 C) (Oral)  Resp 20  SpO2 100%  Physical Exam  Nursing note and vitals reviewed. Constitutional: He is oriented to person, place, and time. He appears well-developed and well-nourished.  HENT:  Head: Normocephalic and atraumatic.  Right Ear: External ear normal.  Left Ear: External ear normal.  Nose: Nose normal.  Mouth/Throat: Oropharynx is clear and moist. No oropharyngeal exudate.  Eyes: Conjunctivae and EOM are normal. Pupils are equal, round, and reactive to light. Right eye exhibits no discharge. Left eye exhibits no discharge. No scleral icterus.  Neck: Normal range of motion. Neck supple. No JVD present.  Cardiovascular: Normal rate, regular rhythm, normal heart sounds and intact distal pulses.  Exam reveals no gallop and no friction rub.   No murmur heard. Pulmonary/Chest: Effort normal and breath sounds normal. No respiratory distress. He has no wheezes. He has no rales. He exhibits no tenderness.  Abdominal: Soft. Bowel sounds are normal. He exhibits no distension and no mass. There is no tenderness. There is no rebound and no guarding.  Musculoskeletal: Normal range of motion. He exhibits no edema and no tenderness.  Neurological: He is alert and oriented to person, place, and time. He has normal reflexes.  CN 3-12 intact  Skin: Skin is warm and dry.  Psychiatric: He has a normal mood and affect. His behavior is normal. Judgment and thought content normal.    ED Course  Procedures (including critical care time)  Labs Reviewed  CBC WITH DIFFERENTIAL  BASIC METABOLIC PANEL  URINALYSIS, ROUTINE W REFLEX MICROSCOPIC   Results for orders placed during the hospital  encounter of 01/02/13  CBC WITH DIFFERENTIAL      Result Value  Range   WBC 10.6 (*) 4.0 - 10.5 K/uL   RBC 3.73 (*) 4.22 - 5.81 MIL/uL   Hemoglobin 10.3 (*) 13.0 - 17.0 g/dL   HCT 16.1 (*) 09.6 - 04.5 %   MCV 84.7  78.0 - 100.0 fL   MCH 27.6  26.0 - 34.0 pg   MCHC 32.6  30.0 - 36.0 g/dL   RDW 40.9  81.1 - 91.4 %   Platelets 152  150 - 400 K/uL   Neutrophils Relative 76  43 - 77 %   Lymphocytes Relative 10 (*) 12 - 46 %   Monocytes Relative 14 (*) 3 - 12 %   Eosinophils Relative 0  0 - 5 %   Basophils Relative 0  0 - 1 %   Neutro Abs 8.0 (*) 1.7 - 7.7 K/uL   Lymphs Abs 1.1  0.7 - 4.0 K/uL   Monocytes Absolute 1.5 (*) 0.1 - 1.0 K/uL   Eosinophils Absolute 0.0  0.0 - 0.7 K/uL   Basophils Absolute 0.0  0.0 - 0.1 K/uL   WBC Morphology VACUOLATED NEUTROPHILS    BASIC METABOLIC PANEL      Result Value Range   Sodium 137  135 - 145 mEq/L   Potassium 3.9  3.5 - 5.1 mEq/L   Chloride 103  96 - 112 mEq/L   CO2 20  19 - 32 mEq/L   Glucose, Bld 90  70 - 99 mg/dL   BUN 37 (*) 6 - 23 mg/dL   Creatinine, Ser 7.82 (*) 0.50 - 1.35 mg/dL   Calcium 8.4  8.4 - 95.6 mg/dL   GFR calc non Af Amer 28 (*) >90 mL/min   GFR calc Af Amer 32 (*) >90 mL/min  URINALYSIS, ROUTINE W REFLEX MICROSCOPIC      Result Value Range   Color, Urine YELLOW  YELLOW   APPearance TURBID (*) CLEAR   Specific Gravity, Urine 1.017  1.005 - 1.030   pH 5.0  5.0 - 8.0   Glucose, UA NEGATIVE  NEGATIVE mg/dL   Hgb urine dipstick MODERATE (*) NEGATIVE   Bilirubin Urine NEGATIVE  NEGATIVE   Ketones, ur NEGATIVE  NEGATIVE mg/dL   Protein, ur 213 (*) NEGATIVE mg/dL   Urobilinogen, UA 0.2  0.0 - 1.0 mg/dL   Nitrite NEGATIVE  NEGATIVE   Leukocytes, UA LARGE (*) NEGATIVE  URINE MICROSCOPIC-ADD ON      Result Value Range   Squamous Epithelial / LPF RARE  RARE   WBC, UA TOO NUMEROUS TO COUNT  <3 WBC/hpf   RBC / HPF 3-6  <3 RBC/hpf   Bacteria, UA MANY (*) RARE   Casts GRANULAR CAST (*) NEGATIVE  POCT I-STAT TROPONIN I      Result Value Range   Troponin i, poc 0.03  0.00 - 0.08 ng/mL    Comment 3            Dg Chest 2 View  01/02/2013  *RADIOLOGY REPORT*  Clinical Data: Chest pain.  CHEST - 2 VIEW  Comparison: 12/02/2012  Findings: Cardiomegaly.  No effusions or edema.  Previously noted in edema has resolved.  Minimal right basilar density may reflect atelectasis. No acute bony abnormality.  IMPRESSION: Resolution of previously noted edema.  Minimal right base density, likely atelectasis.   Original Report Authenticated By: Charlett Nose, M.D.      ED ECG REPORT  I personally interpreted this EKG  Date: 01/02/2013   Rate: 103  Rhythm: sinus tachycardia  QRS Axis: normal  Intervals: PR prolonged  ST/T Wave abnormalities: normal  Conduction Disutrbances:ventricluar bigeminy  Narrative Interpretation: ventricular bigeminy and tachy  Old EKG Reviewed: changes noted    1. UTI (lower urinary tract infection)   2. Fever       MDM  62 year old male with fever and generalized weakness. Patient recently seen for UTI/bacteremia. Patient's rectal temp is 102.1, suspicious of sepsis, as the patient is immune compromised secondary to kidney transplant, will obtain blood cultures, and start IV antibiotics. 11:30 Rectal temp 102.1  12:43 PM Discussed the patient with Dr. Lorenso Courier, due to the fact that the patient is a renal transplant and is immunocompromised with a fever without a source, will treat the patient with Vanc/zosyn for sepsis.  Pharmacy will dose.  2:20 PM Discussed the patient with trial hospitalist, who will admit the patient to the hospital. Dr. Isidoro Donning, team 2.        Roxy Horseman, PA-C 01/02/13 1506

## 2013-01-02 NOTE — ED Provider Notes (Signed)
Medical screening examination/treatment/procedure(s) were performed by non-physician practitioner and as supervising physician I was immediately available for consultation/collaboration.  Marwan T Powers, MD 01/02/13 1511 

## 2013-01-02 NOTE — ED Notes (Signed)
Pt from home via EMS. C/o generalized weakness nausea. Pt states he fell walking to bathroom. Denies head injury.

## 2013-01-03 LAB — HEMOGLOBIN A1C
Hgb A1c MFr Bld: 7.4 % — ABNORMAL HIGH (ref <5.7)
Mean Plasma Glucose: 166 mg/dL — ABNORMAL HIGH (ref <117)

## 2013-01-03 LAB — GLUCOSE, CAPILLARY
Glucose-Capillary: 161 mg/dL — ABNORMAL HIGH (ref 70–99)
Glucose-Capillary: 294 mg/dL — ABNORMAL HIGH (ref 70–99)
Glucose-Capillary: 184 mg/dL — ABNORMAL HIGH (ref 70–99)

## 2013-01-03 LAB — BASIC METABOLIC PANEL
BUN: 45 mg/dL — ABNORMAL HIGH (ref 6–23)
Chloride: 100 mEq/L (ref 96–112)
GFR calc Af Amer: 33 mL/min — ABNORMAL LOW (ref >90)
Potassium: 4.1 mEq/L (ref 3.5–5.1)
Sodium: 133 mEq/L — ABNORMAL LOW (ref 135–145)

## 2013-01-03 LAB — CBC
HCT: 29.9 % — ABNORMAL LOW (ref 39.0–52.0)
Hemoglobin: 9.9 g/dL — ABNORMAL LOW (ref 13.0–17.0)
MCHC: 33.1 g/dL (ref 30.0–36.0)
RBC: 3.57 MIL/uL — ABNORMAL LOW (ref 4.22–5.81)

## 2013-01-03 LAB — MAGNESIUM: Magnesium: 1.5 mg/dL (ref 1.5–2.5)

## 2013-01-03 MED ORDER — CAPSAICIN 0.025 % EX CREA
1.0000 | TOPICAL_CREAM | Freq: Three times a day (TID) | CUTANEOUS | Status: DC
Start: 2013-01-03 — End: 2013-01-05
  Administered 2013-01-03 – 2013-01-05 (×6): 1 via TOPICAL
  Filled 2013-01-03: qty 56.6

## 2013-01-03 MED ORDER — SIMVASTATIN 20 MG PO TABS
20.0000 mg | ORAL_TABLET | Freq: Every day | ORAL | Status: DC
  Filled 2013-01-03: qty 1

## 2013-01-03 MED ORDER — PRAVASTATIN SODIUM 40 MG PO TABS
80.0000 mg | ORAL_TABLET | Freq: Every day | ORAL | Status: DC
  Filled 2013-01-03: qty 2

## 2013-01-03 MED ORDER — PRAVASTATIN SODIUM 40 MG PO TABS
80.0000 mg | ORAL_TABLET | Freq: Every day | ORAL | Status: DC
  Administered 2013-01-03 – 2013-01-04 (×2): 80 mg via ORAL
  Filled 2013-01-03 (×3): qty 2

## 2013-01-03 MED ORDER — MAGNESIUM SULFATE IN D5W 10-5 MG/ML-% IV SOLN
1.0000 g | Freq: Once | INTRAVENOUS | Status: AC
  Administered 2013-01-03: 1 g via INTRAVENOUS
  Filled 2013-01-03: qty 100

## 2013-01-03 MED ORDER — AMLODIPINE BESYLATE 10 MG PO TABS
10.0000 mg | ORAL_TABLET | Freq: Every day | ORAL | Status: DC
  Administered 2013-01-03 – 2013-01-05 (×3): 10 mg via ORAL
  Filled 2013-01-03 (×3): qty 1

## 2013-01-03 MED ORDER — METOCLOPRAMIDE HCL 10 MG PO TABS
10.0000 mg | ORAL_TABLET | Freq: Every day | ORAL | Status: DC
  Administered 2013-01-03 – 2013-01-05 (×3): 10 mg via ORAL
  Filled 2013-01-03 (×3): qty 1

## 2013-01-03 MED ORDER — INSULIN ASPART 100 UNIT/ML ~~LOC~~ SOLN
0.0000 [IU] | Freq: Three times a day (TID) | SUBCUTANEOUS | Status: DC
  Administered 2013-01-03: 4 [IU] via SUBCUTANEOUS
  Administered 2013-01-03: 11 [IU] via SUBCUTANEOUS
  Administered 2013-01-04 (×2): 7 [IU] via SUBCUTANEOUS

## 2013-01-03 MED ORDER — PANTOPRAZOLE SODIUM 40 MG PO TBEC
40.0000 mg | DELAYED_RELEASE_TABLET | Freq: Every day | ORAL | Status: DC
  Administered 2013-01-03 – 2013-01-05 (×3): 40 mg via ORAL
  Filled 2013-01-03: qty 1
  Filled 2013-01-03: qty 2
  Filled 2013-01-03: qty 1

## 2013-01-03 NOTE — Progress Notes (Signed)
[facility] PROGRESS NOTE  Janet Decesare Revelo ZOX:[id number] DOB: [birth year] DOA: 01/02/2013 PCP: No primary provider on file.  Assessment/Plan:  UTI (urinary tract infection)  Placed on vancomycin and Zosyn initially now narrowed to Zosyn only, follow urine cultures Leukocytosis resolving  Hypomagnesemia Repleted. Repeat serum magnesium in the a.m.  DM 2   HbA1c is 7.4 on 01/02/2013.  on Lantus and sliding scale insulin.  Increase SSI to resistant 2/17  RENAL FAILURE, ACUTE:  Resolving. Creatinine at baseline.  Likely secondary to nausea vomiting and dehydration, UTI  Hold lasix and most antihypertensives.  History of renal transplant  Continue immunosuppressants, currently on hydrocortisone as an inpatient.  Nausea and vomiting Likely secondary to urinary tract infection Resolved. Advancing diet. Treated supportively with antiemetics, IV hydration, and slow advancement of diet.   Hypertension Restart amlodipine on February 17 Will add back the rest of his hypertensive medications as his blood pressure rises.   Code Status: full code  Family Communication:  Disposition Plan: To home when appropriate.  Awaiting results of urine cultures area   Consultants:    Procedures:    Antibiotics:  Vancomycin and Zosyn started at admission   2/17 Zosyn only.  HPI/Subjective: No complaints. Feeling better. Tolerating full liquids.   Objective: Filed Vitals:   01/02/13 1612 01/02/13 1635 01/02/13 2110 01/03/13 0446  BP:  142/53 136/73 130/67  Pulse:  79 72 76  Temp: 99.8 F (37.7 C) 99.9 F (37.7 C) 99.1 F (37.3 C) 97.7 F (36.5 C)  TempSrc: Axillary Oral Oral Oral  Resp:  20 20 18   Height:  6\' 1"  (1.854 m)    Weight:  117.3 kg (258 lb 9.6 oz)    SpO2:  100% 99% 100%    Intake/Output Summary (Last 24 hours) at 01/03/13 1130 Last data filed at 01/03/13 1031  Gross per 24 hour  Intake 1427.17 ml  Output    475 ml  Net 952.17 ml   Filed Weights    01/02/13 1635  Weight: 117.3 kg (258 lb 9.6 oz)    Exam:   General:  Well-developed well-nourished, African American male, lying comfortably in bed   Cardiovascular: regular rate and rhythm, slight systolic murmur, no rubs or gallops, no lower extremity edema   Respiratory: clear to auscultation, no wheezes crackles or rales   Abdomen: Obese, soft, nontender, nondistended, positive bowel sounds     Data Reviewed: Basic Metabolic Panel:  Recent Labs Lab 01/02/13 1109 01/02/13 1610 01/03/13 0002 01/03/13 0640  NA 137  --   --  133*  K 3.9  --   --  4.1  CL 103  --   --  100  CO2 20  --   --  17*  GLUCOSE 90  --   --  305*  BUN 37*  --   --  45*  CREATININE 2.35* 2.41*  --  2.32*  CALCIUM 8.4  --   --  8.2*  MG  --   --  1.5  --    CBC:  Recent Labs Lab 01/02/13 1109 01/02/13 1610 01/03/13 0640  WBC 10.6* 9.2 7.8  NEUTROABS 8.0*  --   --   HGB 10.3* 9.2* 9.9*  HCT 31.6* 28.5* 29.9*  MCV 84.7 84.6 83.8  PLT 152 146* 153   BNP (last 3 results)  Recent Labs  12/01/12 0943  PROBNP 4046.0*   CBG:  Recent Labs Lab 01/02/13 1659 01/02/13 2108 01/03/13 0741  GLUCAP 92 161* 294*  Recent Results (from the past 240 hour(s))  CULTURE, BLOOD (ROUTINE X 2)     Status: None   Collection Time    01/02/13 12:23 PM      Result Value Range Status   Specimen Description BLOOD RIGHT ARM   Final   Special Requests BOTTLES DRAWN AEROBIC AND ANAEROBIC   Final   Culture  Setup Time 01/02/2013 17:32   Final   Culture     Final   Value:        BLOOD CULTURE RECEIVED NO GROWTH TO DATE CULTURE WILL BE HELD FOR 5 DAYS BEFORE ISSUING A FINAL NEGATIVE REPORT   Report Status PENDING   Incomplete  CULTURE, BLOOD (ROUTINE X 2)     Status: None   Collection Time    01/02/13 12:30 PM      Result Value Range Status   Specimen Description BLOOD RIGHT HAND   Final   Special Requests BOTTLES DRAWN AEROBIC ONLY 1CC   Final   Culture  Setup Time 01/02/2013 17:33   Final    Culture     Final   Value:        BLOOD CULTURE RECEIVED NO GROWTH TO DATE CULTURE WILL BE HELD FOR 5 DAYS BEFORE ISSUING A FINAL NEGATIVE REPORT   Report Status PENDING   Incomplete     Studies: Dg Chest 2 View  01/02/2013  *RADIOLOGY REPORT*  Clinical Data: Chest pain.  CHEST - 2 VIEW  Comparison: 12/02/2012  Findings: Cardiomegaly.  No effusions or edema.  Previously noted in edema has resolved.  Minimal right basilar density may reflect atelectasis. No acute bony abnormality.  IMPRESSION: Resolution of previously noted edema.  Minimal right base density, likely atelectasis.   Original Report Authenticated By: Charlett Nose, M.D.    US Abdomen Complete  01/02/2013  *RADIOLOGY REPORT*  Clinical Data:  Acute renal failure, service evaluate for pyelonephritis/renal abscess  COMPLETE ABDOMINAL ULTRASOUND  Comparison:  Abdominal ultrasound 11/29/2012  Findings:  Gallbladder:  No gallstones, gallbladder wall thickening, or pericholecystic fluid.  Common bile duct:  Limited visibility due to obstructing bowel gas. Visualized portion is within normal limits of 4.5 mm in caliber.  Liver:  No focal lesion identified.  Within normal limits in parenchymal echogenicity.  IVC:  Appears normal.  Pancreas:  Limited evaluation secondary to obscuring bowel gas  Spleen:  Sonographically unremarkable.  8.4 cm in length.  Right Kidney:  Small highly echogenic native right kidney measures approximately 10 cm in length.  No appreciable flow.  Left Kidney:  Small highly echogenic native left kidney measures approximately 10 cm in length.  No appreciable vascular flow.  Transplant kidney:  Normal appearing right lower quadrant transplant kidney measures approximately 13.4 cm in length.  There is minimal prominence of the renal pelvis compared to prior. Normal renal cortical thickness and echogenicity.  Abdominal aorta:  No aneurysm identified.  IMPRESSION: 1.  Stable appearance of small echogenic native kidneys consistent with  longstanding medical renal disease. 2.  Very mild pelviectasis of the right lower quadrant transplant kidney compared to prior.  Otherwise, the transplant kidney is unremarkable.   Original Report Authenticated By: Malachy Moan, M.D.     Scheduled Meds: . aspirin EC  81 mg Oral Daily  . atorvastatin  80 mg Oral QHS  . cycloSPORINE modified  150 mg Oral BID  . gabapentin  300 mg Oral BID  . heparin  5,000 Units Subcutaneous Q8H  . hydrocortisone sod succinate (SOLU-CORTEF) injection  100 mg Intravenous Q8H  . insulin aspart  0-15 Units Subcutaneous TID WC  . insulin aspart  0-5 Units Subcutaneous QHS  . insulin glargine  15 Units Subcutaneous QAC breakfast  . loratadine  10 mg Oral Daily  . mycophenolate  1,000 mg Oral BID  . piperacillin-tazobactam (ZOSYN)  IV  3.375 g Intravenous Q8H  . sodium bicarbonate  650 mg Oral BID  . sodium chloride  3 mL Intravenous Q12H   Continuous Infusions: . sodium chloride 50 mL/hr at 01/03/13 1610    Principal Problem:   SIRS (systemic inflammatory response syndrome) Active Problems:   DM 2   RENAL FAILURE, ACUTE   History of renal transplant   Nausea and vomiting   UTI (urinary tract infection)    Time spent: 30 minutes    Stephani Police, PA-C   [facility] Pager [phone] If,  8PM-8AM, please contact night-coverage at [email or web address], password [facility] 01/03/2013, 11:30 AM  LOS: 1 day   Attending -patient seen and exmained. Agree with the above assessment and plan. Vomiting resolved. Stop Vanco, c/w Zosyn. Await cultures, decrease IVF.  S Journey [place]

## 2013-01-03 NOTE — Evaluation (Signed)
Physical Therapy Evaluation Patient Details Name: [name] MRN: [id number] DOB: [birth year] Today's Date: 01/03/2013 Time: 5284-1324 PT Time Calculation (min): 10 min  PT Assessment / Plan / Recommendation Clinical Impression  Pt adm with UTI.  Pt well known to me from previous adm.  Pt has made excellent improvements with mobility and endurance at home with [facility].  From PT standpoint pt can return home with family and HHPT.    PT Assessment  Patient needs continued PT services    Follow Up Recommendations  Home health PT    Does the patient have the potential to tolerate intense rehabilitation      Barriers to Discharge        Equipment Recommendations  None recommended by PT    Recommendations for Other Services     Frequency Min 3X/week    Precautions / Restrictions Precautions Precautions: Fall   Pertinent Vitals/Pain None      Mobility  Transfers Transfers: Sit to Stand;Stand to Sit Sit to Stand: 5: Supervision;With upper extremity assist;With armrests;From chair/3-in-1 Stand to Sit: 5: Supervision;With upper extremity assist;With armrests;To chair/3-in-1 Ambulation/Gait Ambulation/Gait Assistance: 4: Min guard Ambulation Distance (Feet): 200 Feet Assistive device: Rolling walker Ambulation/Gait Assistance Details: Pt with slight trunk flexion. Gait Pattern: Step-through pattern;Decreased stride length Gait velocity: decr    Exercises     PT Diagnosis: Difficulty walking;Generalized weakness  PT Problem List: Decreased strength;Decreased activity tolerance;Decreased balance;Decreased mobility PT Treatment Interventions: DME instruction;Gait training;Functional mobility training;Patient/family education;Therapeutic activities;Therapeutic exercise;Balance training   PT Goals Acute Rehab PT Goals PT Goal Formulation: With patient Time For Goal Achievement: 01/10/13 Potential to Achieve Goals: Good Pt will go Sit to Stand: with modified  independence PT Goal: Sit to Stand - Progress: Goal set today Pt will go Stand to Sit: with modified independence PT Goal: Stand to Sit - Progress: Goal set today Pt will Ambulate: >150 feet;with modified independence;with least restrictive assistive device PT Goal: Ambulate - Progress: Goal set today  Visit Information  Last PT Received On: 01/03/13 Assistance Needed: +1    Subjective Data  Subjective: Pt states he has been doing well at home. Patient Stated Goal: Find out why he gets this infection.   Prior Functioning  Home Living Lives With: Spouse Available Help at Discharge: Family;Available 24 hours/day Type of Home: House Home Access: Level entry Home Layout: One level Bathroom Shower/Tub: Walk-in shower Home Adaptive Equipment: Programmer, systems - four wheeled Prior Function Level of Independence: Independent with assistive device(s) (for gait and transfers) Driving: Yes Vocation: On disability Communication Communication: No difficulties    Cognition  Cognition Overall Cognitive Status: Appears within functional limits for tasks assessed/performed Arousal/Alertness: Awake/alert Orientation Level: Appears intact for tasks assessed Behavior During Session: [facility] for tasks performed    Extremity/Trunk Assessment Right Lower Extremity Assessment RLE ROM/Strength/Tone: [facility] for tasks assessed Left Lower Extremity Assessment LLE ROM/Strength/Tone: [facility] for tasks assessed   Balance Balance Balance Assessed: Yes Static Standing Balance Static Standing - Balance Support: Bilateral upper extremity supported Static Standing - Level of Assistance: 5: Stand by assistance  End of Session PT - End of Session Equipment Utilized During Treatment: Gait belt Activity Tolerance: Patient tolerated treatment well Patient left: in chair;with call bell/phone within reach Nurse Communication: Mobility status  GP     Ridge Lafond 01/03/2013, 1:38 PM  [employer]  PT [phone]

## 2013-01-03 NOTE — Progress Notes (Addendum)
Pt running ventricular bigeminy on 2033 cardiac strip. Pt asymptomatic blood pressure 136/73, pulse 72, temperature 99.1 F (37.3 C), temperature source Oral, resp. rate 20, SpO2 99.00%., O2 @ 2L [place]. Pt had an admission in January with same heart irregularities and patient needed magnesium replacement. Benedetto Coons, NP notified and orders given to draw a mag level which revealed a level of 1.5. Mag replacement ordered and administered. Pt currently running NSR on telemetry and resting in bed. Will continue to monitor and assist as needed. Julien Nordmann [facility]

## 2013-01-04 DIAGNOSIS — I10 Essential (primary) hypertension: Secondary | ICD-10-CM

## 2013-01-04 LAB — GLUCOSE, CAPILLARY
Glucose-Capillary: 227 mg/dL — ABNORMAL HIGH (ref 70–99)
Glucose-Capillary: 204 mg/dL — ABNORMAL HIGH (ref 70–99)

## 2013-01-04 LAB — BASIC METABOLIC PANEL
Calcium: 8.4 mg/dL (ref 8.4–10.5)
GFR calc non Af Amer: 29 mL/min — ABNORMAL LOW (ref >90)
Sodium: 136 mEq/L (ref 135–145)

## 2013-01-04 LAB — MAGNESIUM: Magnesium: 1.9 mg/dL (ref 1.5–2.5)

## 2013-01-04 LAB — URINE CULTURE

## 2013-01-04 LAB — CBC
Platelets: 204 10*3/uL (ref 150–400)
RBC: 3.68 MIL/uL — ABNORMAL LOW (ref 4.22–5.81)
WBC: 9.7 10*3/uL (ref 4.0–10.5)

## 2013-01-04 MED ORDER — INSULIN ASPART PROT & ASPART (70-30 MIX) 100 UNIT/ML ~~LOC~~ SUSP
15.0000 [IU] | Freq: Two times a day (BID) | SUBCUTANEOUS | Status: DC
  Administered 2013-01-04 – 2013-01-05 (×2): 15 [IU] via SUBCUTANEOUS
  Filled 2013-01-04: qty 10

## 2013-01-04 MED ORDER — LABETALOL HCL 100 MG PO TABS
100.0000 mg | ORAL_TABLET | Freq: Two times a day (BID) | ORAL | Status: DC
  Administered 2013-01-04 – 2013-01-05 (×3): 100 mg via ORAL
  Filled 2013-01-04 (×5): qty 1

## 2013-01-04 MED ORDER — FUROSEMIDE 40 MG PO TABS
40.0000 mg | ORAL_TABLET | Freq: Every day | ORAL | Status: DC
  Administered 2013-01-04 – 2013-01-05 (×2): 40 mg via ORAL
  Filled 2013-01-04 (×2): qty 1

## 2013-01-04 MED ORDER — MAGNESIUM HYDROXIDE 400 MG/5ML PO SUSP
15.0000 mL | Freq: Once | ORAL | Status: AC
  Administered 2013-01-04: 15 mL via ORAL
  Filled 2013-01-04: qty 30

## 2013-01-04 MED ORDER — TAMSULOSIN HCL 0.4 MG PO CAPS
0.4000 mg | ORAL_CAPSULE | Freq: Every day | ORAL | Status: DC
  Administered 2013-01-04 – 2013-01-05 (×2): 0.4 mg via ORAL
  Filled 2013-01-04 (×2): qty 1

## 2013-01-04 MED ORDER — DOCUSATE SODIUM 100 MG PO CAPS
100.0000 mg | ORAL_CAPSULE | Freq: Two times a day (BID) | ORAL | Status: DC
  Administered 2013-01-04 – 2013-01-05 (×3): 100 mg via ORAL
  Filled 2013-01-04 (×4): qty 1

## 2013-01-04 MED ORDER — INSULIN ASPART 100 UNIT/ML ~~LOC~~ SOLN
0.0000 [IU] | Freq: Three times a day (TID) | SUBCUTANEOUS | Status: DC
  Administered 2013-01-05: 2 [IU] via SUBCUTANEOUS

## 2013-01-04 MED ORDER — PREDNISONE 50 MG PO TABS
50.0000 mg | ORAL_TABLET | Freq: Every day | ORAL | Status: DC
  Administered 2013-01-05: 50 mg via ORAL
  Filled 2013-01-04 (×2): qty 1

## 2013-01-04 MED ORDER — HYDROCORTISONE SOD SUCCINATE 100 MG IJ SOLR
100.0000 mg | Freq: Two times a day (BID) | INTRAMUSCULAR | Status: DC
  Filled 2013-01-04: qty 2

## 2013-01-04 MED ORDER — PREDNISONE 5 MG PO TABS: 5.0000 mg | ORAL_TABLET | Freq: Every day | ORAL | Status: DC

## 2013-01-04 NOTE — Progress Notes (Signed)
[facility] PROGRESS NOTE  [name] Parkinson ZOX:[id number] DOB: 01/17/1951 DOA: 01/02/2013 PCP: No primary provider on file.  Assessment/Plan:  UTI (urinary tract infection)  Placed on vancomycin and Zosyn initially now narrowed to Zosyn only,  follow urine cultures (gram negative rods) awaiting sensitivities Leukocytosis resolving  Hypomagnesemia Repleted with IV Mag.  Recheck Serum Mag 1.9  DM 2  HbA1c is 7.4 on 01/02/2013. Recent hypoglycemia. on Lantus and sliding scale insulin.  Increase SSI to resistant 2/17  RENAL FAILURE, ACUTE:  Resolving. Creatinine at baseline.  Likely secondary to nausea vomiting and dehydration, UTI  D/C IVF today.  Restart Lasix tomorrow.  History of renal transplant  Continue CellCept Changed from prednisone  5 mg to Solu-cortef 100 mg tid on admission.  Decrease solu-cortef today and change to prednisone taper 2/19.  Nausea and vomiting Likely secondary to urinary tract infection Resolved. Tolerating carb modified diet. Treated supportively with antiemetics Fluids discontinued.   Hypertension Restart amlodipine on February 17 Restarted 1/3 dose of labetalol (100 mg bid) Will add back the rest of his hypertensive medications as his blood pressure rises.   Code Status: full code  Family Communication:  Disposition Plan: To home when appropriate.  Awaiting results of urine cultures/sensitivities   Antibiotics:  Vancomycin and Zosyn started at admission   2/17 Zosyn only.  HPI/Subjective: No complaints.  Tolerating carb modified diet.  Hopeful for discharge soon.    Objective: Filed Vitals:   01/03/13 0446 01/03/13 1345 01/03/13 2224 01/04/13 0624  BP: 130/67 143/76 135/71 148/76  Pulse: 76 65 60 79  Temp: 97.7 F (36.5 C) 97.6 F (36.4 C) 98.2 F (36.8 C) 98.4 F (36.9 C)  TempSrc: Oral Oral Oral Oral  Resp: 18 20 20 20   Height:      Weight:      SpO2: 100% 100% 99% 100%    Intake/Output Summary (Last 24  hours) at 01/04/13 1102 Last data filed at 01/04/13 0820  Gross per 24 hour  Intake 3472.5 ml  Output   2150 ml  Net 1322.5 ml   Filed Weights   01/02/13 1635  Weight: 117.3 kg (258 lb 9.6 oz)    Exam:   General:  Well-developed well-nourished, African American male, lying comfortably in bed. smiling  Cardiovascular: regular rate and rhythm, slight systolic murmur, no rubs or gallops, no lower extremity edema   Respiratory: clear to auscultation, no wheezes crackles or rales   Abdomen: Obese, soft, nontender, nondistended, positive bowel sounds   Skin:  No rash, bruise or lesion  Psch:  A&O, NAD, Cooperative, Well groomed.    Data Reviewed: Basic Metabolic Panel:  Recent Labs Lab 01/02/13 1109 01/02/13 1610 01/03/13 0002 01/03/13 0640 01/04/13 0740  NA 137  --   --  133* 136  K 3.9  --   --  4.1 3.7  CL 103  --   --  100 101  CO2 20  --   --  17* 18*  GLUCOSE 90  --   --  305* 217*  BUN 37*  --   --  45* 52*  CREATININE 2.35* 2.41*  --  2.32* 2.31*  CALCIUM 8.4  --   --  8.2* 8.4  MG  --   --  1.5  --  1.9   CBC:  Recent Labs Lab 01/02/13 1109 01/02/13 1610 01/03/13 0640 01/04/13 0740  WBC 10.6* 9.2 7.8 9.7  NEUTROABS 8.0*  --   --   --   HGB  10.3* 9.2* 9.9* 10.0*  HCT 31.6* 28.5* 29.9* 30.3*  MCV 84.7 84.6 83.8 82.3  PLT 152 146* 153 204   BNP (last 3 results)  Recent Labs  12/01/12 0943  PROBNP 4046.0*   CBG:  Recent Labs Lab 01/03/13 0741 01/03/13 1152 01/03/13 1713 01/03/13 2219 01/04/13 0807  GLUCAP 294* 286* 184* 168* 209*    Recent Results (from the past 240 hour(s))  CULTURE, BLOOD (ROUTINE X 2)     Status: None   Collection Time    01/02/13 12:23 PM      Result Value Range Status   Specimen Description BLOOD RIGHT ARM   Final   Special Requests BOTTLES DRAWN AEROBIC AND ANAEROBIC   Final   Culture  Setup Time 01/02/2013 17:32   Final   Culture     Final   Value:        BLOOD CULTURE RECEIVED NO GROWTH TO DATE  CULTURE WILL BE HELD FOR 5 DAYS BEFORE ISSUING A FINAL NEGATIVE REPORT   Report Status PENDING   Incomplete  URINE CULTURE     Status: None   Collection Time    01/02/13 12:25 PM      Result Value Range Status   Specimen Description URINE, RANDOM   Final   Special Requests NONE   Final   Culture  Setup Time 01/02/2013 20:15   Final   Colony Count >=100,000 COLONIES/ML   Final   Culture GRAM NEGATIVE RODS   Final   Report Status PENDING   Incomplete  CULTURE, BLOOD (ROUTINE X 2)     Status: None   Collection Time    01/02/13 12:30 PM      Result Value Range Status   Specimen Description BLOOD RIGHT HAND   Final   Special Requests BOTTLES DRAWN AEROBIC ONLY 1CC   Final   Culture  Setup Time 01/02/2013 17:33   Final   Culture     Final   Value:        BLOOD CULTURE RECEIVED NO GROWTH TO DATE CULTURE WILL BE HELD FOR 5 DAYS BEFORE ISSUING A FINAL NEGATIVE REPORT   Report Status PENDING   Incomplete     Studies: Dg Chest 2 View  01/02/2013  *RADIOLOGY REPORT*  Clinical Data: Chest pain.  CHEST - 2 VIEW  Comparison: 12/02/2012  Findings: Cardiomegaly.  No effusions or edema.  Previously noted in edema has resolved.  Minimal right basilar density may reflect atelectasis. No acute bony abnormality.  IMPRESSION: Resolution of previously noted edema.  Minimal right base density, likely atelectasis.   Original Report Authenticated By: Charlett Nose, M.D.    US Abdomen Complete  01/02/2013  *RADIOLOGY REPORT*  Clinical Data:  Acute renal failure, service evaluate for pyelonephritis/renal abscess  COMPLETE ABDOMINAL ULTRASOUND  Comparison:  Abdominal ultrasound 11/29/2012  Findings:  Gallbladder:  No gallstones, gallbladder wall thickening, or pericholecystic fluid.  Common bile duct:  Limited visibility due to obstructing bowel gas. Visualized portion is within normal limits of 4.5 mm in caliber.  Liver:  No focal lesion identified.  Within normal limits in parenchymal echogenicity.  IVC:  Appears  normal.  Pancreas:  Limited evaluation secondary to obscuring bowel gas  Spleen:  Sonographically unremarkable.  8.4 cm in length.  Right Kidney:  Small highly echogenic native right kidney measures approximately 10 cm in length.  No appreciable flow.  Left Kidney:  Small highly echogenic native left kidney measures approximately 10 cm in length.  No appreciable  vascular flow.  Transplant kidney:  Normal appearing right lower quadrant transplant kidney measures approximately 13.4 cm in length.  There is minimal prominence of the renal pelvis compared to prior. Normal renal cortical thickness and echogenicity.  Abdominal aorta:  No aneurysm identified.  IMPRESSION: 1.  Stable appearance of small echogenic native kidneys consistent with longstanding medical renal disease. 2.  Very mild pelviectasis of the right lower quadrant transplant kidney compared to prior.  Otherwise, the transplant kidney is unremarkable.   Original Report Authenticated By: Malachy Moan, M.D.     Scheduled Meds: . amLODipine  10 mg Oral Daily  . aspirin EC  81 mg Oral Daily  . capsaicin  1 application Topical TID PC & HS  . cycloSPORINE modified  150 mg Oral BID  . furosemide  40 mg Oral Daily  . gabapentin  300 mg Oral BID  . heparin  5,000 Units Subcutaneous Q8H  . hydrocortisone sod succinate (SOLU-CORTEF) injection  100 mg Intravenous Q8H  . insulin aspart  0-20 Units Subcutaneous TID WC  . insulin aspart  0-5 Units Subcutaneous QHS  . insulin glargine  15 Units Subcutaneous QAC breakfast  . labetalol  100 mg Oral BID  . loratadine  10 mg Oral Daily  . metoCLOPramide  10 mg Oral Daily  . mycophenolate  1,000 mg Oral BID  . pantoprazole  40 mg Oral Daily  . piperacillin-tazobactam (ZOSYN)  IV  3.375 g Intravenous Q8H  . pravastatin  80 mg Oral QHS  . sodium bicarbonate  650 mg Oral BID  . sodium chloride  3 mL Intravenous Q12H   Continuous Infusions:    Principal Problem:   SIRS (systemic inflammatory  response syndrome) Active Problems:   DM 2   RENAL FAILURE, ACUTE   History of renal transplant   Nausea and vomiting   UTI (urinary tract infection)   Time spent: 30 minutes  Stephani Police, PA-C   [facility] Pager [phone] If,  8PM-8AM, please contact night-coverage at [email or web address], password [facility] 01/04/2013, 11:02 AM  LOS: 2 days   Attending -patient seen and examined, agree with the assessment and plan. Nausea and vomiting have resolved. Urine cultures are positive for gram neg rods, awaiting sensitivity.BP slowly trending back up. Has recent h/o Enterobacter UTI and Bacteremia-therefore will need to wait for final culture results prior to d/c  S Ghimire

## 2013-01-04 NOTE — Progress Notes (Signed)
Physical Therapy Treatment Patient Details Name: [name] MRN: [id number] DOB: 1951/01/19 Today's Date: 01/04/2013 Time: 9147-8295 PT Time Calculation (min): 15 min  PT Assessment / Plan / Recommendation Comments on Treatment Session  Pt. with improved activity tolerance.  Encouraged ambulation with supervision 3x/day    Follow Up Recommendations  Home health PT     Does the patient have the potential to tolerate intense rehabilitation     Barriers to Discharge        Equipment Recommendations  None recommended by PT    Recommendations for Other Services    Frequency Min 3X/week   Plan Discharge plan remains appropriate;Frequency remains appropriate    Precautions / Restrictions Precautions Precautions: Fall Restrictions Weight Bearing Restrictions: No   Pertinent Vitals/Pain Pt. Denies pain    Mobility  Transfers Transfers: Sit to Stand;Stand to Sit Sit to Stand: 6: Modified independent (Device/Increase time);With upper extremity assist;With armrests;From chair/3-in-1 Stand to Sit: 6: Modified independent (Device/Increase time);With armrests;With upper extremity assist;To chair/3-in-1 Ambulation/Gait Ambulation/Gait Assistance: 5: Supervision Ambulation Distance (Feet): 250 Feet Assistive device: Rolling walker Gait Pattern: Step-through pattern;Decreased stride length    Exercises General Exercises - Lower Extremity Hip ABduction/ADduction: AROM;Both;15 reps;Standing Straight Leg Raises: AROM;Both;15 reps;Standing Hip Flexion/Marching: AROM;Both;15 reps;Standing   PT Diagnosis:    PT Problem List:   PT Treatment Interventions:     PT Goals Acute Rehab PT Goals PT Goal: Sit to Stand - Progress: Met PT Goal: Stand to Sit - Progress: Met PT Goal: Ambulate - Progress: Progressing toward goal  Visit Information  Last PT Received On: 01/04/13 Assistance Needed: +1    Subjective Data  Subjective: "This is what I do at home"   Cognition   Cognition Overall Cognitive Status: Appears within functional limits for tasks assessed/performed Arousal/Alertness: Awake/alert Orientation Level: Appears intact for tasks assessed Behavior During Session: [facility] for tasks performed    Balance     End of Session PT - End of Session Equipment Utilized During Treatment: Gait belt Activity Tolerance: Patient tolerated treatment well Patient left: in chair;with call bell/phone within reach;with family/visitor present Nurse Communication: Mobility status   GP     Feltis, Nicki Reaper 01/04/2013, 9:22 AM  Timoteo Gaul, PT, DPT [phone]

## 2013-01-04 NOTE — Progress Notes (Signed)
Order received, chart reviewed, pt doing well with PT. Spoke with pt and wife and they feel that they have BADLs covered. No OT needs identified, will D/C from acute OT. Ignacia Palma, [place] [phone] 01/04/2013

## 2013-01-04 NOTE — Care Management Note (Addendum)
    Page 1 of 2   01/05/2013     9:38:55 AM   CARE MANAGEMENT NOTE 01/05/2013  Patient:  ONIX, JUMPER   Account Number:  [id number]  Date Initiated:  01/04/2013  Documentation initiated by:  Letha Cape  Subjective/Objective Assessment:   dx sirs, uti  admit- lives with spouse.  Patient is active with [facility] for HHPT.     Action/Plan:   pt eval- rec hhpt.   Anticipated DC Date:  01/05/2013   Anticipated DC Plan:  HOME W HOME HEALTH SERVICES      DC Planning Services  CM consult      [facility] Choice  Resumption Of Svcs/PTA Provider   Choice offered to / List presented to:  C-1 Patient        HH arranged  HH-2 PT      [facility] agency  [facility]   Status of service:  Completed, signed off Medicare Important Message given?   (If response is "NO", the following Medicare IM given date fields will be blank) Date Medicare IM given:   Date Additional Medicare IM given:    Discharge Disposition:  HOME W HOME HEALTH SERVICES  Per UR Regulation:  Reviewed for med. necessity/level of care/duration of stay  If discussed at Long Length of Stay Meetings, dates discussed:    Comments:  01/05/13 9:38 Letha Cape RN, BSN [phone] patient is for dc today, Lupita Leash with [facility] notifiedl.  01/04/13 10:40 Letha Cape RN, BSN [phone] patient lives with spouse, patient is active with [facility] for HHPT, patient would like to continue with [facility], patient is for possbile dc tomorrow, [facility] notified.  Patient would like to speak with Diabetic Educator, informed MD to put order in.  Patient wife states that he did not get a basket with his rollator, informed her that [facility] states their rollators do not come with baskets and they will need to purchase one from the medical supply store,  I checked with [facility] and they do have the baskets , patient's wife will go by to purchase one.

## 2013-01-04 NOTE — Progress Notes (Signed)
Inpatient Diabetes Program Recommendations  AACE/ADA: New Consensus Statement on Inpatient Glycemic Control (2013)  Target Ranges:  Prepandial:   less than 140 mg/dL      Peak postprandial:   less than 180 mg/dL (1-2 hours)      Critically ill patients:  140 - 180 mg/dL   Diabetes Coordinator spoke with patient concerning insulin regimen at home. Patient states he only takes Lantus 15 units.   States that it is too expensive for him to continue taking.  Wanted to discuss other options.  Novolin 70/30 or Humulin 70/30 may be a more affordable option for this patient.  He will talk with his insurance company tomorrow to find out which one is covered best.  During hospitalization he can be switched to Novolog 70/30 (hospital formulary) and at discharge get RX for Novolin 70/30.  Patient currently receiving steroids therefore recommend 70/30 15 units BID.  Reevaluate tomorrow morning since steroids are being tapered.  Spoke with Dr. [name] and he will discontinue Lantus and order 70/30 to begin in the morning with breakfast.  Also recommend decreasing Novolog correction scale to moderate.  Will follow. Thank you  Piedad Climes BSN, RN,CDE Inpatient Diabetes Coordinator [phone] (team pager)

## 2013-01-04 NOTE — Progress Notes (Signed)
[facility]  Patient Status: Active (receiving services up to time of hospitalization)  [facility] is providing the following services: PT  If patient discharges after hours, please call [phone].   Kizzie Furnish 01/04/2013, 1:04 PM

## 2013-01-05 LAB — GLUCOSE, CAPILLARY
Glucose-Capillary: 52 mg/dL — ABNORMAL LOW (ref 70–99)
Glucose-Capillary: 133 mg/dL — ABNORMAL HIGH (ref 70–99)

## 2013-01-05 MED ORDER — INSULIN NPH ISOPHANE & REGULAR (70-30) 100 UNIT/ML ~~LOC~~ SUSP: 10.0000 [IU] | Freq: Two times a day (BID) | SUBCUTANEOUS | Status: DC

## 2013-01-05 MED ORDER — PREDNISONE 5 MG PO TABS: 5.0000 mg | ORAL_TABLET | Freq: Every day | ORAL | Status: DC

## 2013-01-05 MED ORDER — TAMSULOSIN HCL 0.4 MG PO CAPS: 0.4000 mg | ORAL_CAPSULE | Freq: Every day | ORAL | Status: DC

## 2013-01-05 MED ORDER — PREDNISONE 10 MG PO TABS: ORAL_TABLET | ORAL | Status: DC

## 2013-01-05 MED ORDER — INSULIN NPH ISOPHANE & REGULAR (70-30) 100 UNIT/ML ~~LOC~~ SUSP: 15.0000 [IU] | Freq: Two times a day (BID) | SUBCUTANEOUS | Status: DC

## 2013-01-05 MED ORDER — LABETALOL HCL 300 MG PO TABS: 150.0000 mg | ORAL_TABLET | Freq: Two times a day (BID) | ORAL | Status: DC

## 2013-01-05 MED ORDER — CIPROFLOXACIN HCL 500 MG PO TABS: 500.0000 mg | ORAL_TABLET | Freq: Two times a day (BID) | ORAL | Status: DC

## 2013-01-05 MED ORDER — AMLODIPINE BESYLATE 10 MG PO TABS: 10.0000 mg | ORAL_TABLET | Freq: Every day | ORAL | Status: DC

## 2013-01-05 NOTE — Progress Notes (Signed)
Hypoglycemic Event  CBG: 52  Treatment: 15 GM carbohydrate snack  Symptoms: None  Follow-up CBG: Time:1251 CBG Result:75  Possible Reasons for Event: Medication regimen: NPH dose updated  Comments/MD notified:Elmahi    Rahman Ferrall, [place] Iles  Remember to initiate Hypoglycemia Order Set & complete

## 2013-01-05 NOTE — Progress Notes (Signed)
Patient evaluated for community based chronic disease management services with [facility] Care Management Program as a benefit of patient's [employer]. Patient will receive a post discharge transition of care call and will be evaluated for monthly home visits for assessments and disease process education.  Spoke with patient at bedside to explain [facility] Care Management services. Written consents obtained.  Requested [facility] from [facility] for diabetic education.  Contact information verified.  Left contact information and [facility] literature at bedside.  Primary care physician is Wayland Denis.  [phone].  Of note, [facility] Care Management services does not replace or interfere with any services that are arranged by inpatient case management or social work.  For additional questions or referrals please contact Anibal Henderson BSN RN [facility] Liaison at [phone].

## 2013-01-05 NOTE — Discharge Summary (Signed)
Addendum  Patient seen and examined, chart and data base reviewed.  I agree with the above assessment and discharge plan.  For full details please see Mrs. Algis Downs PA note.   Clint Lipps, MD [facility] Pager: [phone] 01/05/2013, 4:58 PM

## 2013-01-05 NOTE — Discharge Summary (Signed)
Physician Discharge Summary  [name] HQI:[id number] DOB: Mar 29, 1951 DOA: 01/02/2013  PCP: No primary provider on file.  Admit date: 01/02/2013 Discharge date: 01/05/2013  Time spent: 40 minutes  Recommendations for Outpatient Follow-up:  Follow up with primary care physician in 7-10 days regarding blood pressure, diabetes management, BPH  Discharge Diagnoses:  Principal Problem:   SIRS (systemic inflammatory response syndrome) Active Problems:   DM 2   RENAL FAILURE, ACUTE   History of renal transplant   Nausea and vomiting   UTI (urinary tract infection)   Discharge Condition: Improved and stable Diet recommendation: Carb modified  Filed Weights   01/02/13 1635  Weight: 117.3 kg (258 lb 9.6 oz)    History of present illness:  [name] is a 62 year old African American male with a history of kidney transplant, diabetes, and hypertension. It is noteworthy that he was hospitalized in January of 2014 with Enterobacter bacteremia. He had completed his course of ciprofloxacin. When he presented to the emergency department on February 16 he had been unable to hold down anything to eat for 2 days. He had a fever of 102, he was hypotensive and his UA was positive for urinary tract infection. He was also noted to be in acute renal failure with an elevated creatinine. He was admitted to the [facility] service with a diagnosis of Sirs, with UTI acute renal failure and dehydration.  Hospital Course:   SIRS with UTI (urinary tract infection)  He was placed  on vancomycin and Zosyn initially. This was nares to Zosyn after 24 hours. Urine cultures grew Enterobacter  once again. He received 3 days of IV Zosyn and then was changed to Cipro as cultures indicated that the Enterobacter was sensitive to Cipro. He will receive a total of 10 days of antibiotic treatment. He was discharged on 7 more days of Cipro 500 mg by mouth twice a day.  Leukocytosis Resolved on  antibiotics.  Hypertension/hypotension. Patient's blood pressure was soft and remained so for the first 48 hours of his admission. As he improved we added back Lasix and labetalol at a reduced dose. On discharge we continued to hold  Imdur.  He was advised to see his primary care physician in one week to have his blood pressure medications adjusted.  Hypomagnesemia  Repleted with IV Mag. Recheck Serum Mag 1.9   DM 2  HbA1c is 7.4 on 01/02/2013.  Recent hypoglycemia. The patient remarked that he was unable to afford Lantus insulin. After consultation with the diabetic coordinators it was recommended that he start on novel and 7030. He was initially given 15 units twice a day. His CBGs have been elevated do to steroids. The 15 units prove to be too much as his blood sugar dropped to 50. His dosage was decreased to 10 units twice a day. Has been advised not to take any insulin if his CBG is below 150. Recommend followup with his primary care physician for ongoing diabetic management.  RENAL FAILURE, ACUTE:  Resolving. Creatinine at baseline.  Likely secondary to nausea vomiting and dehydration, UTI  Resolved with IV fluids and withholding nephrotoxic medications.  History of renal transplant  Continue CellCept and cyclosporine. Changed from prednisone 5 mg to Solu-cortef 100 mg tid on admission. Solu-Cortef was gradually tapered, and the patient was progressed to prednisone. He will be discharged on a short taper of oral prednisone.  Nausea and vomiting  Likely secondary to urinary tract infection  Resolved. Tolerating carb modified diet.  Treated supportively  with antiemetics   Benign prostate hypertrophy Due to recurrent urinary tract infections and at the patient's request it was felt that her rectal exam would be appropriate. The patient's prostate was smooth but enlarged. He described symptoms of hesitancy and frequency. He was started on Flomax.   Discharge Exam: Filed Vitals:    01/04/13 1431 01/04/13 2145 01/05/13 0536 01/05/13 1033  BP: 157/61 142/83 124/75 148/75  Pulse: 72 61 62 63  Temp: 97.8 F (36.6 C) 97.6 F (36.4 C) 97.6 F (36.4 C)   TempSrc: Oral Oral Oral   Resp: 20 20 18    Height:      Weight:      SpO2: 100% 98% 100%     General: Well-developed extremely pleasant, African American male. Up walking around the room Cardiovascular: Regular rate and rhythm, slight systolic murmur, no rubs or gallops Respiratory: Clear to dictation, no wheezes crackles or rales Abdomen: Obese, nontender nondistended, positive bowel sounds, no masses  Discharge Instructions  Discharge Orders   Future Appointments Provider Department Dept Phone   01/10/2013 11:00 AM [name], MD [facility] PRIMARY CARE ENDOCRINOLOGY [phone]   Future Orders Complete By Expires     Ambulatory referral to Nutrition and Diabetic Education  As directed     Diet - low sodium heart healthy  As directed     Diet - low sodium heart healthy  As directed     Increase activity slowly  As directed     Increase activity slowly  As directed         Medication List    STOP taking these medications       insulin glargine 100 UNIT/ML injection  Commonly known as:  LANTUS     isosorbide mononitrate 30 MG 24 hr tablet  Commonly known as:  IMDUR      TAKE these medications       albuterol 108 (90 BASE) MCG/ACT inhaler  Commonly known as:  PROVENTIL HFA;VENTOLIN HFA  Inhale 2 puffs into the lungs every 4 (four) hours as needed for wheezing.     amLODipine 10 MG tablet  Commonly known as:  NORVASC  Take 1 tablet (10 mg total) by mouth daily.     ARTHRITIS PAIN RELIEF 650 MG CR tablet  Generic drug:  acetaminophen  Take 650-1,300 mg by mouth 2 (two) times daily as needed (arthritis).     aspirin EC 81 MG tablet  Take 81 mg by mouth daily.     azelastine 0.05 % ophthalmic solution  Commonly known as:  OPTIVAR  Place 1 drop into both eyes 2 (two) times daily.      capsaicin 0.025 % cream  Commonly known as:  ZOSTRIX  Apply 1 application topically 4 (four) times daily - after meals and at bedtime.     ciprofloxacin 500 MG tablet  Commonly known as:  CIPRO  Take 1 tablet (500 mg total) by mouth 2 (two) times daily.     cycloSPORINE modified 25 MG capsule  Commonly known as:  NEORAL  Take 50 mg by mouth 2 (two) times daily. In addition to 100mg  po twice daily for total dose of 150mg  twice daily.     cycloSPORINE modified 100 MG capsule  Commonly known as:  NEORAL  Take 100 mg by mouth 2 (two) times daily. In addition to 50mg  twice daily for total 150 mg twice daily.     fexofenadine 180 MG tablet  Commonly known as:  ALLEGRA  Take 180 mg  by mouth daily as needed. For allergies     fluticasone 50 MCG/ACT nasal spray  Commonly known as:  FLONASE  Place 1 spray into the nose daily as needed. Allergies or sinus congestion.     furosemide 80 MG tablet  Commonly known as:  LASIX  Take 80 mg by mouth daily.     gabapentin 300 MG capsule  Commonly known as:  NEURONTIN  Take 300 mg by mouth 2 (two) times daily.     insulin NPH-insulin regular (70-30) 100 UNIT/ML injection  Commonly known as:  NOVOLIN 70/30  Inject 10 Units into the skin 2 (two) times daily with a meal. This is instead of Lantus     labetalol 300 MG tablet  Commonly known as:  NORMODYNE  Take 0.5 tablets (150 mg total) by mouth 2 (two) times daily.     metoCLOPramide 10 MG tablet  Commonly known as:  REGLAN  Take 10 mg by mouth daily.     mycophenolate 250 MG capsule  Commonly known as:  CELLCEPT  Take 1,000 mg by mouth 2 (two) times daily.     omeprazole 20 MG capsule  Commonly known as:  PRILOSEC  Take 20 mg by mouth daily.     pravastatin 80 MG tablet  Commonly known as:  PRAVACHOL  Take 80 mg by mouth at bedtime.     predniSONE 5 MG tablet  Commonly known as:  DELTASONE  Take 1 tablet (5 mg total) by mouth daily. Resume the prednisone 5 mg after your finished  the 50 mg taper.     predniSONE 10 MG tablet  Commonly known as:  DELTASONE  Take 4 tablets on 2/20  Take 3 tablets on 2/21  Take 2 tablets on 2/22  Take 1 tablet on 2/23  Then resume taking the prednisone 5 mg daily.     sodium bicarbonate 650 MG tablet  Take 1 tablet (650 mg total) by mouth 2 (two) times daily.     Tamsulosin HCl 0.4 MG Caps  Commonly known as:  FLOMAX  Take 1 capsule (0.4 mg total) by mouth daily.           Follow-up Information   Follow up with OSEI-BONSU,GEORGE, MD. Schedule an appointment as soon as possible for a visit in 1 week.   Contact information:   [address], [address] [place] [address] [phone]       Follow up with [name], MD. Schedule an appointment as soon as possible for a visit in 1 week.   Contact information:   [address] [place] [address] [phone]        The results of significant diagnostics from this hospitalization (including imaging, microbiology, ancillary and laboratory) are listed below for reference.    Significant Diagnostic Studies: Dg Chest 2 View  01/02/2013  *RADIOLOGY REPORT*  Clinical Data: Chest pain.  CHEST - 2 VIEW  Comparison: 12/02/2012  Findings: Cardiomegaly.  No effusions or edema.  Previously noted in edema has resolved.  Minimal right basilar density may reflect atelectasis. No acute bony abnormality.  IMPRESSION: Resolution of previously noted edema.  Minimal right base density, likely atelectasis.   Original Report Authenticated By: Charlett Nose, M.D.    US Abdomen Complete  01/02/2013  *RADIOLOGY REPORT*  Clinical Data:  Acute renal failure, service evaluate for pyelonephritis/renal abscess  COMPLETE ABDOMINAL ULTRASOUND  Comparison:  Abdominal ultrasound 11/29/2012  Findings:  Gallbladder:  No gallstones, gallbladder wall thickening,  or pericholecystic fluid.  Common bile duct:  Limited visibility due to obstructing bowel gas. Visualized portion  is within normal limits of 4.5 mm in caliber.  Liver:  No focal lesion identified.  Within normal limits in parenchymal echogenicity.  IVC:  Appears normal.  Pancreas:  Limited evaluation secondary to obscuring bowel gas  Spleen:  Sonographically unremarkable.  8.4 cm in length.  Right Kidney:  Small highly echogenic native right kidney measures approximately 10 cm in length.  No appreciable flow.  Left Kidney:  Small highly echogenic native left kidney measures approximately 10 cm in length.  No appreciable vascular flow.  Transplant kidney:  Normal appearing right lower quadrant transplant kidney measures approximately 13.4 cm in length.  There is minimal prominence of the renal pelvis compared to prior. Normal renal cortical thickness and echogenicity.  Abdominal aorta:  No aneurysm identified.  IMPRESSION: 1.  Stable appearance of small echogenic native kidneys consistent with longstanding medical renal disease. 2.  Very mild pelviectasis of the right lower quadrant transplant kidney compared to prior.  Otherwise, the transplant kidney is unremarkable.   Original Report Authenticated By: Malachy Moan, M.D.     Microbiology: Recent Results (from the past 240 hour(s))  CULTURE, BLOOD (ROUTINE X 2)     Status: None   Collection Time    01/02/13 12:23 PM      Result Value Range Status   Specimen Description BLOOD RIGHT ARM   Final   Special Requests BOTTLES DRAWN AEROBIC AND ANAEROBIC   Final   Culture  Setup Time 01/02/2013 17:32   Final   Culture     Final   Value:        BLOOD CULTURE RECEIVED NO GROWTH TO DATE CULTURE WILL BE HELD FOR 5 DAYS BEFORE ISSUING A FINAL NEGATIVE REPORT   Report Status PENDING   Incomplete  URINE CULTURE     Status: None   Collection Time    01/02/13 12:25 PM      Result Value Range Status   Specimen Description URINE, RANDOM   Final   Special Requests NONE   Final   Culture  Setup Time 01/02/2013 20:15   Final   Colony Count >=100,000 COLONIES/ML   Final    Culture ENTEROBACTER AEROGENES   Final   Report Status 01/04/2013 FINAL   Final   Organism ID, Bacteria ENTEROBACTER AEROGENES   Final  CULTURE, BLOOD (ROUTINE X 2)     Status: None   Collection Time    01/02/13 12:30 PM      Result Value Range Status   Specimen Description BLOOD RIGHT HAND   Final   Special Requests BOTTLES DRAWN AEROBIC ONLY 1CC   Final   Culture  Setup Time 01/02/2013 17:33   Final   Culture     Final   Value:        BLOOD CULTURE RECEIVED NO GROWTH TO DATE CULTURE WILL BE HELD FOR 5 DAYS BEFORE ISSUING A FINAL NEGATIVE REPORT   Report Status PENDING   Incomplete     Labs: Basic Metabolic Panel:  Recent Labs Lab 01/02/13 1109 01/02/13 1610 01/03/13 0002 01/03/13 0640 01/04/13 0740  NA 137  --   --  133* 136  K 3.9  --   --  4.1 3.7  CL 103  --   --  100 101  CO2 20  --   --  17* 18*  GLUCOSE 90  --   --  305* 217*  BUN 37*  --   --  45* 52*  CREATININE 2.35* 2.41*  --  2.32* 2.31*  CALCIUM 8.4  --   --  8.2* 8.4  MG  --   --  1.5  --  1.9   CBC:  Recent Labs Lab 01/02/13 1109 01/02/13 1610 01/03/13 0640 01/04/13 0740  WBC 10.6* 9.2 7.8 9.7  NEUTROABS 8.0*  --   --   --   HGB 10.3* 9.2* 9.9* 10.0*  HCT 31.6* 28.5* 29.9* 30.3*  MCV 84.7 84.6 83.8 82.3  PLT 152 146* 153 204    BNP (last 3 results)  Recent Labs  12/01/12 0943  PROBNP 4046.0*   CBG:  Recent Labs Lab 01/04/13 2142 01/05/13 0748 01/05/13 1203 01/05/13 1251 01/05/13 1408  GLUCAP 204* 133* 52* 75 133*       Signed:  Stephani Police, PA-C [facility] 01/05/2013, 4:01 PM    \

## 2013-01-05 NOTE — Progress Notes (Signed)
Patient discharge teaching given, including activity, diet, follow-up appoints, and medications. Patient verbalized understanding of all discharge instructions. IV access was d/c'd. Vitals are stable. Skin is intact except as charted in most recent assessments. Pt to be escorted out by NT, to be driven home by family. 

## 2013-01-05 NOTE — Progress Notes (Addendum)
Inpatient Diabetes Program Recommendations  AACE/ADA: New Consensus Statement on Inpatient Glycemic Control (2013)  Target Ranges:  Prepandial:   less than 140 mg/dL      Peak postprandial:   less than 180 mg/dL (1-2 hours)      Critically ill patients:  140 - 180 mg/dL   Diabetes Coordinator spoke with patient and wife concerning switch from Lantus to ReliOn Novolin 70/30 for affordability.  Patient does have [employer] but his copay for insulin is $45 and he can buy out of pocket at [facility] for approximately $25.  Patient's CBGs are much improved since IV steroid discontinued.  Patient received 70/30 15 units this morning with breakfast and subsequently CBG at noon was 52.  Discharge orders were changed to Novolin 70/30 10 units with breakfast and supper.  Encouraged patient to record CBGs at home TID ac and HS.  Also encouraged patient to make an appointment with Dr. Lafe Garin ASAP for follow up.  He has not seen her before but needs to establish with an endocrinologist.  [facility] liaison visited patient and will help establish follow up with him.  ADD: CM called to report patient requesting OP DM education.  Order for [facility]) entered.   Thank you  Piedad Climes BSN, RN,CDE Inpatient Diabetes Coordinator [phone] (team pager)

## 2013-01-08 LAB — CULTURE, BLOOD (ROUTINE X 2)

## 2013-01-10 ENCOUNTER — Observation Stay (HOSPITAL_COMMUNITY)
Admission: EM | Admit: 2013-01-10 | Discharge: 2013-01-13 | Disposition: A | Discharge status: Home / Self Care | Payer: Medicare HMO | Attending: Internal Medicine | Admitting: Internal Medicine

## 2013-01-10 ENCOUNTER — Encounter: Payer: Self-pay | Admitting: Internal Medicine

## 2013-01-10 ENCOUNTER — Emergency Department (HOSPITAL_COMMUNITY): Payer: Medicare HMO

## 2013-01-10 ENCOUNTER — Encounter (HOSPITAL_COMMUNITY): Payer: Self-pay | Admitting: Emergency Medicine

## 2013-01-10 ENCOUNTER — Ambulatory Visit (INDEPENDENT_AMBULATORY_CARE_PROVIDER_SITE_OTHER): Payer: Medicare HMO | Admitting: Internal Medicine

## 2013-01-10 VITALS — BP 168/60 | HR 67 | Temp 97.8°F | Resp 16 | Wt 259.0 lb

## 2013-01-10 DIAGNOSIS — I129 Hypertensive chronic kidney disease with stage 1 through stage 4 chronic kidney disease, or unspecified chronic kidney disease: Secondary | ICD-10-CM | POA: Insufficient documentation

## 2013-01-10 DIAGNOSIS — E162 Hypoglycemia, unspecified: Secondary | ICD-10-CM

## 2013-01-10 DIAGNOSIS — W19XXXA Unspecified fall, initial encounter: Secondary | ICD-10-CM | POA: Insufficient documentation

## 2013-01-10 DIAGNOSIS — J209 Acute bronchitis, unspecified: Secondary | ICD-10-CM

## 2013-01-10 DIAGNOSIS — R112 Nausea with vomiting, unspecified: Secondary | ICD-10-CM

## 2013-01-10 DIAGNOSIS — E1143 Type 2 diabetes mellitus with diabetic autonomic (poly)neuropathy: Secondary | ICD-10-CM | POA: Diagnosis present

## 2013-01-10 DIAGNOSIS — Z940 Kidney transplant status: Secondary | ICD-10-CM

## 2013-01-10 DIAGNOSIS — L0201 Cutaneous abscess of face: Secondary | ICD-10-CM

## 2013-01-10 DIAGNOSIS — N390 Urinary tract infection, site not specified: Secondary | ICD-10-CM

## 2013-01-10 DIAGNOSIS — E1142 Type 2 diabetes mellitus with diabetic polyneuropathy: Secondary | ICD-10-CM | POA: Insufficient documentation

## 2013-01-10 DIAGNOSIS — R6510 Systemic inflammatory response syndrome (SIRS) of non-infectious origin without acute organ dysfunction: Secondary | ICD-10-CM

## 2013-01-10 DIAGNOSIS — I10 Essential (primary) hypertension: Secondary | ICD-10-CM

## 2013-01-10 DIAGNOSIS — I517 Cardiomegaly: Secondary | ICD-10-CM

## 2013-01-10 DIAGNOSIS — M25469 Effusion, unspecified knee: Secondary | ICD-10-CM | POA: Insufficient documentation

## 2013-01-10 DIAGNOSIS — E785 Hyperlipidemia, unspecified: Secondary | ICD-10-CM | POA: Insufficient documentation

## 2013-01-10 DIAGNOSIS — R4781 Slurred speech: Secondary | ICD-10-CM

## 2013-01-10 DIAGNOSIS — E872 Acidosis, unspecified: Secondary | ICD-10-CM

## 2013-01-10 DIAGNOSIS — N400 Enlarged prostate without lower urinary tract symptoms: Secondary | ICD-10-CM | POA: Insufficient documentation

## 2013-01-10 DIAGNOSIS — N183 Chronic kidney disease, stage 3 unspecified: Secondary | ICD-10-CM | POA: Insufficient documentation

## 2013-01-10 DIAGNOSIS — M25562 Pain in left knee: Secondary | ICD-10-CM

## 2013-01-10 DIAGNOSIS — Z7689 Persons encountering health services in other specified circumstances: Secondary | ICD-10-CM

## 2013-01-10 DIAGNOSIS — R55 Syncope and collapse: Secondary | ICD-10-CM

## 2013-01-10 DIAGNOSIS — M25569 Pain in unspecified knee: Secondary | ICD-10-CM | POA: Insufficient documentation

## 2013-01-10 DIAGNOSIS — I951 Orthostatic hypotension: Principal | ICD-10-CM

## 2013-01-10 DIAGNOSIS — E119 Type 2 diabetes mellitus without complications: Secondary | ICD-10-CM

## 2013-01-10 DIAGNOSIS — N179 Acute kidney failure, unspecified: Secondary | ICD-10-CM

## 2013-01-10 DIAGNOSIS — G4730 Sleep apnea, unspecified: Secondary | ICD-10-CM

## 2013-01-10 DIAGNOSIS — R7881 Bacteremia: Secondary | ICD-10-CM

## 2013-01-10 DIAGNOSIS — E1149 Type 2 diabetes mellitus with other diabetic neurological complication: Secondary | ICD-10-CM

## 2013-01-10 HISTORY — DX: Orthostatic hypotension: I95.1

## 2013-01-10 HISTORY — DX: Unspecified complication of kidney transplant: T86.10

## 2013-01-10 LAB — CBC WITH DIFFERENTIAL/PLATELET
Basophils Relative: 0 % (ref 0–1)
Eosinophils Absolute: 0.2 10*3/uL (ref 0.0–0.7)
Hemoglobin: 10.6 g/dL — ABNORMAL LOW (ref 13.0–17.0)
Lymphs Abs: 1.3 10*3/uL (ref 0.7–4.0)
MCH: 27.6 pg (ref 26.0–34.0)
Neutro Abs: 7.4 10*3/uL (ref 1.7–7.7)
Neutrophils Relative %: 75 % (ref 43–77)
Platelets: 274 10*3/uL (ref 150–400)
RBC: 3.84 MIL/uL — ABNORMAL LOW (ref 4.22–5.81)

## 2013-01-10 LAB — URINALYSIS, ROUTINE W REFLEX MICROSCOPIC
Nitrite: NEGATIVE
Specific Gravity, Urine: 1.017 (ref 1.005–1.030)
Urobilinogen, UA: 0.2 mg/dL (ref 0.0–1.0)
pH: 5 (ref 5.0–8.0)

## 2013-01-10 LAB — URINE MICROSCOPIC-ADD ON

## 2013-01-10 LAB — POCT I-STAT TROPONIN I: Troponin i, poc: 0.01 ng/mL (ref 0.00–0.08)

## 2013-01-10 LAB — HEPATIC FUNCTION PANEL
Albumin: 3.5 g/dL (ref 3.5–5.2)
Total Protein: 7.2 g/dL (ref 6.0–8.3)

## 2013-01-10 LAB — BASIC METABOLIC PANEL
Chloride: 96 mEq/L (ref 96–112)
GFR calc Af Amer: 39 mL/min — ABNORMAL LOW (ref >90)
GFR calc non Af Amer: 34 mL/min — ABNORMAL LOW (ref >90)
Glucose, Bld: 84 mg/dL (ref 70–99)
Potassium: 3.7 mEq/L (ref 3.5–5.1)
Sodium: 134 mEq/L — ABNORMAL LOW (ref 135–145)

## 2013-01-10 LAB — GLUCOSE, CAPILLARY

## 2013-01-10 MED ORDER — INSULIN DETEMIR 100 UNIT/ML ~~LOC~~ SOLN
20.0000 [IU] | Freq: Every day | SUBCUTANEOUS | Status: DC
  Administered 2013-01-10 – 2013-01-12 (×3): 20 [IU] via SUBCUTANEOUS
  Filled 2013-01-10: qty 10

## 2013-01-10 MED ORDER — ASPIRIN EC 81 MG PO TBEC
81.0000 mg | DELAYED_RELEASE_TABLET | Freq: Every day | ORAL | Status: DC
  Administered 2013-01-11 – 2013-01-12 (×2): 81 mg via ORAL
  Filled 2013-01-10 (×3): qty 1

## 2013-01-10 MED ORDER — METOCLOPRAMIDE HCL 10 MG PO TABS: 10.0000 mg | ORAL_TABLET | Freq: Every day | ORAL | Status: DC

## 2013-01-10 MED ORDER — PREDNISONE 5 MG PO TABS
5.0000 mg | ORAL_TABLET | Freq: Every day | ORAL | Status: DC
  Administered 2013-01-11 – 2013-01-13 (×3): 5 mg via ORAL
  Filled 2013-01-10 (×4): qty 1

## 2013-01-10 MED ORDER — MYCOPHENOLATE MOFETIL 250 MG PO CAPS
1000.0000 mg | ORAL_CAPSULE | Freq: Two times a day (BID) | ORAL | Status: DC
  Administered 2013-01-10 – 2013-01-12 (×5): 1000 mg via ORAL
  Filled 2013-01-10 (×7): qty 4

## 2013-01-10 MED ORDER — GABAPENTIN 300 MG PO CAPS
300.0000 mg | ORAL_CAPSULE | Freq: Two times a day (BID) | ORAL | Status: DC
  Administered 2013-01-11 – 2013-01-13 (×5): 300 mg via ORAL
  Filled 2013-01-10 (×7): qty 1

## 2013-01-10 MED ORDER — PRAVASTATIN SODIUM 40 MG PO TABS
80.0000 mg | ORAL_TABLET | Freq: Every day | ORAL | Status: DC
  Administered 2013-01-10 – 2013-01-12 (×3): 80 mg via ORAL
  Filled 2013-01-10 (×4): qty 2

## 2013-01-10 MED ORDER — SODIUM CHLORIDE 0.9 % IJ SOLN
3.0000 mL | Freq: Two times a day (BID) | INTRAMUSCULAR | Status: DC
  Administered 2013-01-11 – 2013-01-12 (×4): 3 mL via INTRAVENOUS

## 2013-01-10 MED ORDER — HYDROCODONE-ACETAMINOPHEN 5-325 MG PO TABS
2.0000 | ORAL_TABLET | Freq: Once | ORAL | Status: AC
  Administered 2013-01-10: 2 via ORAL
  Filled 2013-01-10: qty 2

## 2013-01-10 MED ORDER — ALBUTEROL SULFATE HFA 108 (90 BASE) MCG/ACT IN AERS
2.0000 | INHALATION_SPRAY | RESPIRATORY_TRACT | Status: DC | PRN
  Filled 2013-01-10: qty 6.7

## 2013-01-10 MED ORDER — METOCLOPRAMIDE HCL 10 MG PO TABS
10.0000 mg | ORAL_TABLET | Freq: Every day | ORAL | Status: DC
  Administered 2013-01-11 – 2013-01-12 (×2): 10 mg via ORAL
  Filled 2013-01-10 (×3): qty 1

## 2013-01-10 MED ORDER — CAPSAICIN 0.025 % EX CREA
1.0000 "application " | TOPICAL_CREAM | Freq: Three times a day (TID) | CUTANEOUS | Status: DC
  Administered 2013-01-10 – 2013-01-13 (×10): 1 via TOPICAL
  Filled 2013-01-10: qty 56.6

## 2013-01-10 MED ORDER — HEPARIN SODIUM (PORCINE) 5000 UNIT/ML IJ SOLN
5000.0000 [IU] | Freq: Three times a day (TID) | INTRAMUSCULAR | Status: DC
  Administered 2013-01-10 – 2013-01-13 (×8): 5000 [IU] via SUBCUTANEOUS
  Filled 2013-01-10 (×11): qty 1

## 2013-01-10 MED ORDER — GABAPENTIN 300 MG PO CAPS: 300.0000 mg | ORAL_CAPSULE | Freq: Two times a day (BID) | ORAL | Status: AC

## 2013-01-10 MED ORDER — FUROSEMIDE 80 MG PO TABS
80.0000 mg | ORAL_TABLET | Freq: Every day | ORAL | Status: DC
  Administered 2013-01-11 – 2013-01-12 (×2): 80 mg via ORAL
  Filled 2013-01-10 (×3): qty 1

## 2013-01-10 MED ORDER — SIMVASTATIN 5 MG PO TABS: 5.0000 mg | ORAL_TABLET | Freq: Every day | ORAL | Status: DC

## 2013-01-10 MED ORDER — CYCLOSPORINE MODIFIED (NEORAL) 100 MG PO CAPS
100.0000 mg | ORAL_CAPSULE | Freq: Two times a day (BID) | ORAL | Status: DC
Start: 2013-01-10 — End: 2013-01-10

## 2013-01-10 MED ORDER — CYCLOSPORINE MODIFIED (NEORAL) 25 MG PO CAPS: 50.0000 mg | ORAL_CAPSULE | Freq: Two times a day (BID) | ORAL | Status: DC

## 2013-01-10 MED ORDER — PANTOPRAZOLE SODIUM 40 MG PO TBEC
40.0000 mg | DELAYED_RELEASE_TABLET | Freq: Every day | ORAL | Status: DC
  Administered 2013-01-11 – 2013-01-12 (×2): 40 mg via ORAL
  Filled 2013-01-10 (×2): qty 1

## 2013-01-10 MED ORDER — INSULIN DETEMIR 100 UNIT/ML ~~LOC~~ SOLN: 20.0000 [IU] | Freq: Every day | SUBCUTANEOUS | Status: DC

## 2013-01-10 MED ORDER — LABETALOL HCL 300 MG PO TABS
150.0000 mg | ORAL_TABLET | Freq: Two times a day (BID) | ORAL | Status: DC
  Administered 2013-01-10 – 2013-01-12 (×5): 150 mg via ORAL
  Filled 2013-01-10 (×7): qty 0.5

## 2013-01-10 MED ORDER — CYCLOSPORINE MODIFIED (NEORAL) 25 MG PO CAPS
100.0000 mg | ORAL_CAPSULE | Freq: Two times a day (BID) | ORAL | Status: DC
  Administered 2013-01-10 – 2013-01-12 (×5): 100 mg via ORAL
  Filled 2013-01-10 (×7): qty 4

## 2013-01-10 MED ORDER — SODIUM BICARBONATE 650 MG PO TABS
650.0000 mg | ORAL_TABLET | Freq: Two times a day (BID) | ORAL | Status: DC
  Administered 2013-01-10 – 2013-01-12 (×5): 650 mg via ORAL
  Filled 2013-01-10 (×7): qty 1

## 2013-01-10 MED ORDER — CYCLOSPORINE MODIFIED (NEORAL) 25 MG PO CAPS
50.0000 mg | ORAL_CAPSULE | Freq: Two times a day (BID) | ORAL | Status: DC
  Administered 2013-01-10 – 2013-01-12 (×5): 50 mg via ORAL
  Filled 2013-01-10 (×7): qty 2

## 2013-01-10 MED ORDER — HYDROCODONE-ACETAMINOPHEN 5-325 MG PO TABS
1.0000 | ORAL_TABLET | ORAL | Status: DC | PRN
  Administered 2013-01-10: 2 via ORAL
  Filled 2013-01-10: qty 2

## 2013-01-10 NOTE — Progress Notes (Signed)
[facility] History and Physical  [name] ZOX:[id number] DOB: [birth year] DOA: 01/10/2013  Referring physician: Murray Hodgkins PCP: No primary provider on file.  Specialists: none  Chief Complaint: Fall  HPI: [name] is a 62 y.o. male recently d/c form [facility] hospital with sepsis 2/2 to urinary origin who presented form Endocrinology office 2/2 to a fall. He appears to have fallen yesterday morning.  He was in the office at home, came out of the office -he didn't allegedly realize that he had passed out but came to almost immediately and called his sister that he and fallen.  He developed a "shakiness and a weakness" His legs seemed to just go out form under him.  This seems to have happened before almost 2 weeks ago. He was having significant pain today and was less mobile than prior and it seemed to get really bad 2/2 to the Knee pain and  Decided to come to the emergency room for review of the knee pains He has been getting therapy at home and has been able to walk to the [facility] blood pressure medicine seems to have been changed since admission-the labetalol medicine seems to have changed and was decreased from prior   Review of Systems: The patient denies any fevers or chills.  He was a little cloudy after the epiode, but this cleared almost immediately.  NO cp a tthe time, no sob, no cough, no dark stool or tarry stool, + Hiccups, no stomach pain, no SOB    Past Medical History  Diagnosis Date  . Diabetes mellitus   . Hypertension    Chart review Admit 2.16.14 with n/v and  Sepsis 2/2 to Enterobacter UTI Admission 1.12.14 with Enterobacter UTI/Hypoglycemia/Met acidosis Admission 10/23/08 for Neuroglycopenia, asp pna AKI Admission 01/28/02 CHF with Fluid overload    Past Surgical History  Procedure Laterality Date  . Kidney transplant     Social History:  History   Social History Narrative   Retired and on disability-since 1999 for Renal failure   Is  originally from [place] for 38 yrs-came back here in 2002   Use dto smoke and drink only until age of 23 or 49 yrs of age   Past Marijuana user     Allergies  Allergen Reactions  . Iodine Anaphylaxis    Iv dye  . Shellfish Allergy Anaphylaxis and Swelling    Has throat swelling, tongue swelling, and entire body swells up.     History reviewed. No pertinent family history.  Prior to Admission medications   Medication Sig Start Date End Date Taking? Authorizing Provider  acetaminophen (ARTHRITIS PAIN RELIEF) 650 MG CR tablet Take 650-1,300 mg by mouth 2 (two) times daily as needed (arthritis).    Yes Historical Provider, MD  albuterol (PROVENTIL HFA;VENTOLIN HFA) 108 (90 BASE) MCG/ACT inhaler Inhale 2 puffs into the lungs every 4 (four) hours as needed for wheezing. 12/04/12  Yes Shanker Levora Dredge, MD  amLODipine (NORVASC) 10 MG tablet Take 1 tablet (10 mg total) by mouth daily. 01/05/13  Yes Tora Kindred York, PA  aspirin EC 81 MG tablet Take 81 mg by mouth daily.   Yes Historical Provider, MD  capsaicin (ZOSTRIX) 0.025 % cream Apply 1 application topically 4 (four) times daily - after meals and at bedtime.   Yes Historical Provider, MD  ciprofloxacin (CIPRO) 500 MG tablet Take 1 tablet (500 mg total) by mouth 2 (two) times daily. 01/05/13  Yes Tora Kindred York, PA  cycloSPORINE modified (NEORAL)  100 MG capsule Take 100 mg by mouth 2 (two) times daily. In addition to 50mg  twice daily for total 150 mg twice daily.   Yes Historical Provider, MD  cycloSPORINE modified (NEORAL) 25 MG capsule Take 50 mg by mouth 2 (two) times daily. In addition to 100mg  po twice daily for total dose of 150mg  twice daily.   Yes Historical Provider, MD  fexofenadine (ALLEGRA) 180 MG tablet Take 180 mg by mouth daily as needed. For allergies   Yes Historical Provider, MD  fluticasone (FLONASE) 50 MCG/ACT nasal spray Place 1 spray into the nose daily as needed. Allergies or sinus congestion.   Yes Historical Provider, MD   furosemide (LASIX) 80 MG tablet Take 80 mg by mouth daily.     Yes Historical Provider, MD  gabapentin (NEURONTIN) 300 MG capsule Take 1 capsule (300 mg total) by mouth 2 (two) times daily. 01/10/13  Yes [name], MD  insulin detemir (LEVEMIR) 100 UNIT/ML injection Inject 20 Units into the skin at bedtime. 01/10/13  Yes [name], MD  labetalol (NORMODYNE) 300 MG tablet Take 0.5 tablets (150 mg total) by mouth 2 (two) times daily. 01/05/13  Yes Tora Kindred York, PA  metoCLOPramide (REGLAN) 10 MG tablet Take 1 tablet (10 mg total) by mouth daily. 01/10/13  Yes [name], MD  mycophenolate (CELLCEPT) 250 MG capsule Take 1,000 mg by mouth 2 (two) times daily.   Yes Historical Provider, MD  omeprazole (PRILOSEC) 20 MG capsule Take 20 mg by mouth daily.     Yes Historical Provider, MD  pravastatin (PRAVACHOL) 80 MG tablet Take 80 mg by mouth at bedtime.   Yes Historical Provider, MD  predniSONE (DELTASONE) 10 MG tablet Take 4 tablets on 2/20 Take 3 tablets on 2/21 Take 2 tablets on 2/22 Take 1 tablet on 2/23 Then resume taking the prednisone 5 mg daily. 01/05/13  Yes Tora Kindred York, PA  predniSONE (DELTASONE) 5 MG tablet Take 1 tablet (5 mg total) by mouth daily. Resume the prednisone 5 mg after your finished the 50 mg taper. 01/05/13  Yes Marianne L York, PA  sodium bicarbonate 650 MG tablet Take 1 tablet (650 mg total) by mouth 2 (two) times daily. 12/04/12  Yes Shanker Levora Dredge, MD  Tamsulosin HCl (FLOMAX) 0.4 MG CAPS Take 1 capsule (0.4 mg total) by mouth daily. 01/05/13  Yes Stephani Police, PA   Physical Exam: Filed Vitals:   01/10/13 1242 01/10/13 1550  BP: 128/57 146/62  Pulse: 65 61  Temp: 98.1 F (36.7 C) 98.4 F (36.9 C)  TempSrc: Oral Oral  Resp: 20 16  SpO2: 100% 100%     General:  Alert oriented, NAD  Eyes: EOMI, no pallor/ict  ENT: clear, mod dentition  Neck: soft supple  Cardiovascular: s1 s2 no m/r/g  Respiratory: cliniically clear, no added  sound  Abdomen: soft, NT/ND  Skin: mild LE edema  Musculoskeletal: ROM to L knee limited.  Skin slightly warmer than R side.  Some joint line tenderness bilaterally on L knee with difficulty in active flexion of Hamstrings.  Passive flexion causes significant pain as well.  Unable to do [place].  ANt drawer and Post drawer show minimal translation  Psychiatric: euthymic  Neurologic:  Grossly intact  Labs on Admission:  Basic Metabolic Panel:  Recent Labs Lab 01/04/13 0740 01/10/13 1502  NA 136 134*  K 3.7 3.7  CL 101 96  CO2 18* 25  GLUCOSE 217* 84  BUN 52* 41*  CREATININE 2.31*  2.02*  CALCIUM 8.4 9.2  MG 1.9  --    Liver Function Tests:  Recent Labs Lab 01/10/13 1502  AST 14  ALT 14  ALKPHOS 59  BILITOT 0.6  PROT 7.2  ALBUMIN 3.5   No results found for this basename: LIPASE, AMYLASE,  in the last 168 hours No results found for this basename: AMMONIA,  in the last 168 hours CBC:  Recent Labs Lab 01/04/13 0740 01/10/13 1502  WBC 9.7 9.8  NEUTROABS  --  7.4  HGB 10.0* 10.6*  HCT 30.3* 32.2*  MCV 82.3 83.9  PLT 204 274   Cardiac Enzymes: No results found for this basename: CKTOTAL, CKMB, CKMBINDEX, TROPONINI,  in the last 168 hours  BNP (last 3 results)  Recent Labs  12/01/12 0943  PROBNP 4046.0*   CBG:  Recent Labs Lab 01/04/13 2142 01/05/13 0748 01/05/13 1203 01/05/13 1251 01/05/13 1408  GLUCAP 204* 133* 52* 75 133*    Radiological Exams on Admission: Ct Head Wo Contrast  01/10/2013  *RADIOLOGY REPORT*  Clinical Data: Fall yesterday.  The the patient awoke on the floor.  CT HEAD WITHOUT CONTRAST  Technique:  Contiguous axial images were obtained from the base of the skull through the vertex without contrast.  Comparison: MRI brain 02/20/2089.  Findings: No acute infarct, hemorrhage, or mass lesion is present. Mild subcortical white matter disease is similar to the prior exam. No acute infarct, hemorrhage, mass lesion is present.  Left  parietal occipital scalp soft tissue swelling is noted.  There is no underlying fracture or dye stasis.  The anterior left ethmoid air cells and frontal sinuses are chronically opacified.  Wall thickening in the posterolateral maxillary sinus bilaterally suggests a history of chronic or recurrent disease.  There is no active disease.  The remaining paranasal sinuses and mastoid air cells are clear. Atherosclerotic calcifications are present within the cavernous carotid arteries bilaterally.  IMPRESSION:  1.  Stable atrophy and white matter disease. 2.  No acute intracranial abnormality. 3.  Left parietal occipital scalp soft tissue swelling without underlying fracture or dye stasis. 4.  Chronic anterior left ethmoid and frontal sinus disease. 5.  Atherosclerosis.   Original Report Authenticated By: Marin Roberts, M.D.    Dg Knee Complete 4 Views Left  01/10/2013  *RADIOLOGY REPORT*  Clinical Data: Fall, leg pain  LEFT KNEE - COMPLETE 4+ VIEW  Comparison: None.  Findings: No fracture or dislocation is seen.  Mild to moderate tricompartmental degenerative changes.  Moderate suprapatellar knee joint effusion.  Vascular calcifications pain  IMPRESSION: No fracture or dislocation is seen.  Mild to moderate degenerative changes.  Moderate suprapatellar knee joint effusion.   Original Report Authenticated By: Charline Bills, M.D.     EKG: Independently reviewed. none  Assessment/Plan Active Problems:   DM 2   DIABETIC PERIPHERAL NEUROPATHY   HYPERTENSION, UNSPECIFIED   VENTRICULAR HYPERTROPHY, LEFT   SLEEP APNEA   History of renal transplant   Gram-negative bacteremia   Knee pain, left anterior   Orthostatic syncope   1. Likely Orthostatic syncope-Patient's orthostatics + on admission-  Will cut back further on blood pressure medicines-have d/c his amlodipine 10 daily-he is also on flomax that likely needs to be discontinued.  Continue other meds as below 2. Knee pain-Have asked Dr. Magnus Ivan  of Orthopedics to kindly assess him for Knee tap.  At this stage will hold off on MRI of knee  Pain control with non IV opiates currently.  Mobilize with PT in am.  3. DM-just seen by Dr. Lafe Garin [endocrinology] who placed him on Levemir 20 qhs-will monitor qid-achs and maybe add coverage in am 4. Diabetic gastroparesis + Neuropathy- continue Gabapentin 300 bid and Reglan 10 podaily 5. ESRD s/p Kidney transplant-continue Prednisone 5 mg, Cyclosporine 150 bid, Mycophenolate 1000 bid and Sodium Bicarbonate.  Renal function actually better than baseline-Outpatient f/u c Dr. Isa Rankin.  Continue lasix 80 bid-would taper as nephrotoxic 6. HLd-Continue Zocor 5 mg po daily 7.   BPH-as no symptoms and orthostatic potential, would d/c for  8.   EF 60-65%.   Code Status: Full  Disposition Plan:  OBs 1-2 days   Time spent: 23  Mahala Menghini [facility] Pager [phone]  If 7PM-7AM, please contact night-coverage [email or web address] Password [facility] 01/10/2013, 5:08 PM

## 2013-01-10 NOTE — Consult Note (Signed)
Reason for Consult:  Bilateral knee pain and effusions post fall Referring Physician:   Mahala Menghini, MD  [name] is an 62 y.o. male.  HPI:   62 yo male who had a near syncopal episode yesterday.  He reports falling on his knees.  He presented to the ER today mainly due to the episode of "blacking out" but also due to his knees swelling and painful after his fall.  His main complaint involves his left knee, but he also states that th right knee hurts as well.  X-rays were obtained of the left knee and shows a large effusion and mild to moderate arthritic changes, but no fracture.  Ortho is consulted to address the knee effusions.  Past Medical History  Diagnosis Date  . Diabetes mellitus   . Hypertension   . Kidney transplant as cause of abnormal reaction or later complication     In [name]-Dr. Colodonato-Dr. Lawerance Cruel is the Transplant dodctor at [facility]    Past Surgical History  Procedure Laterality Date  . Kidney transplant      Family History  Problem Relation Age of Onset  . Diabetes Mother   . Diabetes Father   . Kidney failure Brother   . Heart failure Sister     Social History:  reports that he has quit smoking. He has never used smokeless tobacco. He reports that he does not drink alcohol or use illicit drugs.  Allergies:  Allergies  Allergen Reactions  . Iodine Anaphylaxis    Iv dye  . Shellfish Allergy Anaphylaxis and Swelling    Has throat swelling, tongue swelling, and entire body swells up.     Medications: I have reviewed the patient's current medications.  Results for orders placed during the hospital encounter of 01/10/13 (from the past 48 hour(s))  CBC WITH DIFFERENTIAL     Status: Abnormal   Collection Time    01/10/13  3:02 PM      Result Value Range   WBC 9.8  4.0 - 10.5 K/uL   RBC 3.84 (*) 4.22 - 5.81 MIL/uL   Hemoglobin 10.6 (*) 13.0 - 17.0 g/dL   HCT 40.9 (*) 81.1 - 91.4 %   MCV 83.9  78.0 - 100.0 fL   MCH 27.6  26.0 - 34.0 pg   MCHC  32.9  30.0 - 36.0 g/dL   RDW 78.2  95.6 - 21.3 %   Platelets 274  150 - 400 K/uL   Neutrophils Relative 75  43 - 77 %   Neutro Abs 7.4  1.7 - 7.7 K/uL   Lymphocytes Relative 13  12 - 46 %   Lymphs Abs 1.3  0.7 - 4.0 K/uL   Monocytes Relative 10  3 - 12 %   Monocytes Absolute 1.0  0.1 - 1.0 K/uL   Eosinophils Relative 2  0 - 5 %   Eosinophils Absolute 0.2  0.0 - 0.7 K/uL   Basophils Relative 0  0 - 1 %   Basophils Absolute 0.0  0.0 - 0.1 K/uL  BASIC METABOLIC PANEL     Status: Abnormal   Collection Time    01/10/13  3:02 PM      Result Value Range   Sodium 134 (*) 135 - 145 mEq/L   Potassium 3.7  3.5 - 5.1 mEq/L   Chloride 96  96 - 112 mEq/L   CO2 25  19 - 32 mEq/L   Glucose, Bld 84  70 - 99 mg/dL   BUN 41 (*)  6 - 23 mg/dL   Creatinine, Ser 1.47 (*) 0.50 - 1.35 mg/dL   Calcium 9.2  8.4 - 82.9 mg/dL   GFR calc non Af Amer 34 (*) >90 mL/min   GFR calc Af Amer 39 (*) >90 mL/min   Comment:            The eGFR has been calculated     using the CKD EPI equation.     This calculation has not been     validated in all clinical     situations.     eGFR's persistently     <90 mL/min signify     possible Chronic Kidney Disease.  HEPATIC FUNCTION PANEL     Status: None   Collection Time    01/10/13  3:02 PM      Result Value Range   Total Protein 7.2  6.0 - 8.3 g/dL   Albumin 3.5  3.5 - 5.2 g/dL   AST 14  0 - 37 U/L   ALT 14  0 - 53 U/L   Alkaline Phosphatase 59  39 - 117 U/L   Total Bilirubin 0.6  0.3 - 1.2 mg/dL   Bilirubin, Direct 0.2  0.0 - 0.3 mg/dL   Indirect Bilirubin 0.4  0.3 - 0.9 mg/dL  POCT I-STAT TROPONIN I     Status: None   Collection Time    01/10/13  3:19 PM      Result Value Range   Troponin i, poc 0.01  0.00 - 0.08 ng/mL   Comment 3            Comment: Due to the release kinetics of cTnI,     a negative result within the first hours     of the onset of symptoms does not rule out     myocardial infarction with certainty.     If myocardial infarction is  still suspected,     repeat the test at appropriate intervals.  URINALYSIS, ROUTINE W REFLEX MICROSCOPIC     Status: Abnormal   Collection Time    01/10/13  4:05 PM      Result Value Range   Color, Urine YELLOW  YELLOW   APPearance CLEAR  CLEAR   Specific Gravity, Urine 1.017  1.005 - 1.030   pH 5.0  5.0 - 8.0   Glucose, UA NEGATIVE  NEGATIVE mg/dL   Hgb urine dipstick NEGATIVE  NEGATIVE   Bilirubin Urine NEGATIVE  NEGATIVE   Ketones, ur NEGATIVE  NEGATIVE mg/dL   Protein, ur NEGATIVE  NEGATIVE mg/dL   Urobilinogen, UA 0.2  0.0 - 1.0 mg/dL   Nitrite NEGATIVE  NEGATIVE   Leukocytes, UA SMALL (*) NEGATIVE  URINE MICROSCOPIC-ADD ON     Status: Abnormal   Collection Time    01/10/13  4:05 PM      Result Value Range   Squamous Epithelial / LPF FEW (*) RARE   WBC, UA 3-6  <3 WBC/hpf   RBC / HPF 0-2  <3 RBC/hpf   Bacteria, UA RARE  RARE   Casts GRANULAR CAST (*) NEGATIVE   Comment: HYALINE CASTS  POCT I-STAT TROPONIN I     Status: None   Collection Time    01/10/13  6:34 PM      Result Value Range   Troponin i, poc 0.01  0.00 - 0.08 ng/mL   Comment 3            Comment: Due to the release kinetics of cTnI,  a negative result within the first hours     of the onset of symptoms does not rule out     myocardial infarction with certainty.     If myocardial infarction is still suspected,     repeat the test at appropriate intervals.    Dg Chest 2 View  01/10/2013  *RADIOLOGY REPORT*  Clinical Data: Fall with pain.  Diabetes.  CHEST - 2 VIEW  Comparison: 01/02/2013  Findings: Upper limits normal heart size noted. The lungs are clear. There is no evidence of focal airspace disease, pulmonary edema, suspicious pulmonary nodule/mass, pleural effusion, or pneumothorax. No acute bony abnormalities are identified.  IMPRESSION: No evidence of acute cardiopulmonary disease.   Original Report Authenticated By: Harmon Pier, M.D.    Ct Head Wo Contrast  01/10/2013  *RADIOLOGY REPORT*   Clinical Data: Fall yesterday.  The the patient awoke on the floor.  CT HEAD WITHOUT CONTRAST  Technique:  Contiguous axial images were obtained from the base of the skull through the vertex without contrast.  Comparison: MRI brain 02/20/2089.  Findings: No acute infarct, hemorrhage, or mass lesion is present. Mild subcortical white matter disease is similar to the prior exam. No acute infarct, hemorrhage, mass lesion is present.  Left parietal occipital scalp soft tissue swelling is noted.  There is no underlying fracture or dye stasis.  The anterior left ethmoid air cells and frontal sinuses are chronically opacified.  Wall thickening in the posterolateral maxillary sinus bilaterally suggests a history of chronic or recurrent disease.  There is no active disease.  The remaining paranasal sinuses and mastoid air cells are clear. Atherosclerotic calcifications are present within the cavernous carotid arteries bilaterally.  IMPRESSION:  1.  Stable atrophy and white matter disease. 2.  No acute intracranial abnormality. 3.  Left parietal occipital scalp soft tissue swelling without underlying fracture or dye stasis. 4.  Chronic anterior left ethmoid and frontal sinus disease. 5.  Atherosclerosis.   Original Report Authenticated By: Marin Roberts, M.D.    Dg Knee Complete 4 Views Left  01/10/2013  *RADIOLOGY REPORT*  Clinical Data: Fall, leg pain  LEFT KNEE - COMPLETE 4+ VIEW  Comparison: None.  Findings: No fracture or dislocation is seen.  Mild to moderate tricompartmental degenerative changes.  Moderate suprapatellar knee joint effusion.  Vascular calcifications pain  IMPRESSION: No fracture or dislocation is seen.  Mild to moderate degenerative changes.  Moderate suprapatellar knee joint effusion.   Original Report Authenticated By: Charline Bills, M.D.     ROS Blood pressure 89/45, pulse 86, temperature 98.4 F (36.9 C), temperature source Oral, resp. rate 16, SpO2 100.00%. Physical Exam   Musculoskeletal:       Right knee: He exhibits decreased range of motion and effusion. Tenderness found.       Left knee: He exhibits decreased range of motion and effusion. Tenderness found.    Assessment/Plan: Bilateral knee effusions and pain (L>R) 1) I cleaned both knees with alcohol prep pads at the superior lateral aspect and then aspirated 45-50 cc of bloody fluid from the right knee and 60+ cc of bloody fluid from the left knee.  These seem more serous-anginous in nature from his likely trauma and do not appear to be infected.  I was then comfortable with placing a mixture of 4 cc of plain lidocaine and 1 cc of depomedrol (steroid) into each knee that he tolerated very well.  From my standpoint, ice and NSAIDs or pain meds would be appropriate and full weight  as tolerated.  I'll follow him while he is here.  I gave his wife my card so I can also see him as an outpatient.  Kathryne Hitch 01/10/2013, 6:57 PM

## 2013-01-10 NOTE — ED Notes (Signed)
Pt reports fall yesterday at home. States he woke up on the floor. C/o bilateral knee pain. States left knee hurts worse than right at present. States also fell at home a few weeks ago getting out of the bed.

## 2013-01-10 NOTE — ED Provider Notes (Signed)
History     CSN: [id number]  Arrival date & time 01/10/13  1234   First MD Initiated Contact with Patient 01/10/13 1446      Chief Complaint  Patient presents with  . Fall    (Consider location/radiation/quality/duration/timing/severity/associated sxs/prior treatment) HPI Comments: Patient is a 62 year old male with a past medical history of kidney transplant, diabetes, and hypertension who presents after a fall yesterday. Patient reports walking through his house when he suddenly lost consciousness and fell on the floor. Patient reports "being out" for a few seconds and waking up on the floor. Patient denies any chest pain, dizziness, nausea. He reports associated left knee pain after the fall that does not radiate and made worse by palpation of anterior knee and movement. Patient currently complains of bilateral leg weakness. He feels like his legs are going to collapse when he walks. Patient was admitted to the hospital one week ago for weakness and nausea and was diagnosed with a UTI causing possible sepsis. No associated symptoms. No alleviating factors. Patient reports recent medication changes.   Patient is a 63 y.o. male presenting with fall.  Fall    Past Medical History  Diagnosis Date  . Diabetes mellitus   . Hypertension     Past Surgical History  Procedure Laterality Date  . Kidney transplant      History reviewed. No pertinent family history.  History  Substance Use Topics  . Smoking status: Former Games developer  . Smokeless tobacco: Never Used  . Alcohol Use: No      Review of Systems  Musculoskeletal: Positive for arthralgias.  Neurological: Positive for syncope.  All other systems reviewed and are negative.    Allergies  Iodine and Shellfish allergy  Home Medications   Current Outpatient Rx  Name  Route  Sig  Dispense  Refill  . acetaminophen (ARTHRITIS PAIN RELIEF) 650 MG CR tablet   Oral   Take 650-1,300 mg by mouth 2 (two) times daily as needed  (arthritis).          Marland Kitchen albuterol (PROVENTIL HFA;VENTOLIN HFA) 108 (90 BASE) MCG/ACT inhaler   Inhalation   Inhale 2 puffs into the lungs every 4 (four) hours as needed for wheezing.   1 Inhaler   0   . amLODipine (NORVASC) 10 MG tablet   Oral   Take 1 tablet (10 mg total) by mouth daily.   30 tablet   0   . aspirin EC 81 MG tablet   Oral   Take 81 mg by mouth daily.         . capsaicin (ZOSTRIX) 0.025 % cream   Topical   Apply 1 application topically 4 (four) times daily - after meals and at bedtime.         . ciprofloxacin (CIPRO) 500 MG tablet   Oral   Take 1 tablet (500 mg total) by mouth 2 (two) times daily.   14 tablet   0   . cycloSPORINE modified (NEORAL) 100 MG capsule   Oral   Take 100 mg by mouth 2 (two) times daily. In addition to 50mg  twice daily for total 150 mg twice daily.         . cycloSPORINE modified (NEORAL) 25 MG capsule   Oral   Take 50 mg by mouth 2 (two) times daily. In addition to 100mg  po twice daily for total dose of 150mg  twice daily.         . fexofenadine (ALLEGRA) 180 MG tablet  Oral   Take 180 mg by mouth daily as needed. For allergies         . fluticasone (FLONASE) 50 MCG/ACT nasal spray   Nasal   Place 1 spray into the nose daily as needed. Allergies or sinus congestion.         . furosemide (LASIX) 80 MG tablet   Oral   Take 80 mg by mouth daily.           Marland Kitchen gabapentin (NEURONTIN) 300 MG capsule   Oral   Take 1 capsule (300 mg total) by mouth 2 (two) times daily.   180 capsule   3   . insulin detemir (LEVEMIR) 100 UNIT/ML injection   Subcutaneous   Inject 20 Units into the skin at bedtime.   10 mL   11   . labetalol (NORMODYNE) 300 MG tablet   Oral   Take 0.5 tablets (150 mg total) by mouth 2 (two) times daily.         . metoCLOPramide (REGLAN) 10 MG tablet   Oral   Take 1 tablet (10 mg total) by mouth daily.   90 tablet   3   . mycophenolate (CELLCEPT) 250 MG capsule   Oral   Take 1,000 mg  by mouth 2 (two) times daily.         Marland Kitchen omeprazole (PRILOSEC) 20 MG capsule   Oral   Take 20 mg by mouth daily.           . pravastatin (PRAVACHOL) 80 MG tablet   Oral   Take 80 mg by mouth at bedtime.         . predniSONE (DELTASONE) 10 MG tablet      Take 4 tablets on 2/20 Take 3 tablets on 2/21 Take 2 tablets on 2/22 Take 1 tablet on 2/23 Then resume taking the prednisone 5 mg daily.   10 tablet   0   . predniSONE (DELTASONE) 5 MG tablet   Oral   Take 1 tablet (5 mg total) by mouth daily. Resume the prednisone 5 mg after your finished the 50 mg taper.         . sodium bicarbonate 650 MG tablet   Oral   Take 1 tablet (650 mg total) by mouth 2 (two) times daily.   30 tablet   0   . Tamsulosin HCl (FLOMAX) 0.4 MG CAPS   Oral   Take 1 capsule (0.4 mg total) by mouth daily.   30 capsule   1     BP 128/57  Pulse 65  Temp(Src) 98.1 F (36.7 C) (Oral)  Resp 20  SpO2 100%  Physical Exam  Nursing note and vitals reviewed. Constitutional: He is oriented to person, place, and time. He appears well-developed and well-nourished. No distress.  HENT:  Head: Normocephalic and atraumatic.  Eyes: Conjunctivae and EOM are normal. No scleral icterus.  Neck: Normal range of motion. Neck supple.  Cardiovascular: Normal rate, regular rhythm and intact distal pulses.  Exam reveals no gallop and no friction rub.   No murmur heard. Pulmonary/Chest: Effort normal and breath sounds normal. He has no wheezes. He has no rales. He exhibits no tenderness.  Abdominal: Soft. He exhibits no distension. There is no tenderness. There is no rebound and no guarding.  Musculoskeletal: Normal range of motion.  Left anterior knee tender to palpation. ROM of left knee limited due to pain. No calf tenderness bilaterally. No obvious deformity.   Neurological: He is alert and  oriented to person, place, and time. Coordination normal.  Speech is goal-oriented. Moves limbs without ataxia.    Skin: Skin is warm and dry.  Psychiatric: He has a normal mood and affect. His behavior is normal.    ED Course  Procedures (including critical care time)   Date: 01/10/2013  Rate: 60  Rhythm: normal sinus rhythm  QRS Axis: normal  Intervals: normal  ST/T Wave abnormalities: normal  Conduction Disutrbances:none  Narrative Interpretation: NSR unchanged from previous  Old EKG Reviewed: unchanged    Labs Reviewed  CBC WITH DIFFERENTIAL - Abnormal; Notable for the following:    RBC 3.84 (*)    Hemoglobin 10.6 (*)    HCT 32.2 (*)    All other components within normal limits  BASIC METABOLIC PANEL - Abnormal; Notable for the following:    Sodium 134 (*)    BUN 41 (*)    Creatinine, Ser 2.02 (*)    GFR calc non Af Amer 34 (*)    GFR calc Af Amer 39 (*)    All other components within normal limits  HEPATIC FUNCTION PANEL  URINALYSIS, ROUTINE W REFLEX MICROSCOPIC  POCT I-STAT TROPONIN I   Dg Knee Complete 4 Views Left  01/10/2013  *RADIOLOGY REPORT*  Clinical Data: Fall, leg pain  LEFT KNEE - COMPLETE 4+ VIEW  Comparison: None.  Findings: No fracture or dislocation is seen.  Mild to moderate tricompartmental degenerative changes.  Moderate suprapatellar knee joint effusion.  Vascular calcifications pain  IMPRESSION: No fracture or dislocation is seen.  Mild to moderate degenerative changes.  Moderate suprapatellar knee joint effusion.   Original Report Authenticated By: Charline Bills, M.D.      No diagnosis found.    MDM  3:06 PM Labs and urinalysis pending. Patient will have vicodin for pain. Xray unremarkable for fracture or acute changes.   Patient will be admitted for observation.       Emilia Beck, [place] 01/10/13 2146

## 2013-01-10 NOTE — Patient Instructions (Signed)
Please stop 70/30 insulin. Start Levemir 20 units at night. Please check sugars once in the next 2 nights to make sure your sugars are not dropping. Return to see me in 1 month.  Patient instructions for Diabetes mellitus type 2:  DIET AND EXERCISE Diet and exercise is an important part of diabetic treatment.  We recommended aerobic exercise in the form of brisk walking (working between 40-60% of maximal aerobic capacity, similar to brisk walking) for 150 minutes per week (such as 30 minutes five days per week) along with 3 times per week performing 'resistance' training (using various gauge rubber tubes with handles) 5-10 exercises involving the major muscle groups (upper body, lower body and core) performing 10-15 repetitions (or near fatigue) each exercise. Start at half the above goal but build slowly to reach the above goals. If limited by weight, joint pain, or disability, we recommend daily walking in a swimming pool with water up to waist to reduce pressure from joints while allow for adequate exercise.    BLOOD GLUCOSES Monitoring your blood glucoses is important for continued management of your diabetes. Please check your blood glucoses 2-3 times a day: fasting, before meals and at bedtime (you can rotate these measurements - e.g. one day check before the 3 meals, the next day check before 2 of the meals and before bedtime, etc.   HYPOGLYCEMIA (low blood sugar) Hypoglycemia is usually a reaction to not eating, exercising, or taking too much insulin/ other diabetes drugs.  Symptoms include tremors, sweating, hunger, confusion, headache, etc. Treat IMMEDIATELY with 15 grams of Carbs:   4 glucose tablets    cup regular juice/soda   2 tablespoons raisins   4 teaspoons sugar   1 tablespoon honey Recheck blood glucose in 15 mins and repeat above if still symptomatic/blood glucose <100. Please contact our office at [phone] if you have questions about how to next handle your  insulin.  RECOMMENDATIONS TO REDUCE YOUR RISK OF DIABETIC COMPLICATIONS: * Take your prescribed MEDICATION(S). * Follow a DIABETIC diet: Complex carbs, fiber rich foods, heart healthy fish twice weekly, (monounsaturated and polyunsaturated) fats * AVOID saturated/trans fats, high fat foods, >2,300 mg salt per day. * EXERCISE at least 5 times a week for 30 minutes or preferably daily.  * DO NOT SMOKE OR DRINK more than 1 drink a day. * Check your FEET every day. Do not wear tightfitting shoes. Contact us if you develop an ulcer * See your EYE doctor once a year or more if needed * Get a FLU shot once a year * Get a PNEUMONIA vaccine every 5 years and once after age 3 years  GOALS:  * Your Hemoglobin A1c of 7%  * Your Systolic BP should be 130 or lower  * Your Diastolic BP should be 80 or lower  * Your HDL (Good Cholesterol) should be 40 or higher  * Your LDL (Bad Cholesterol) should be 100 or lower  * Your Triglycerides should be 150 or lower  * Your Urine microalbumin (kidney function) of <30 * Your Body Mass Index should be 25 or lower  We will be glad to help you achieve these goals. Our telephone number is: [phone].

## 2013-01-10 NOTE — ED Notes (Signed)
IV team called to attempt site .

## 2013-01-10 NOTE — ED Notes (Signed)
PT refused to have Tropoin I drawn unless a butterfly was used .

## 2013-01-10 NOTE — Progress Notes (Signed)
Subjective:     Patient ID: [name], male   DOB: 1951-11-02, 62 y.o.   MRN: [id number]  HPI [name] is a 62 year old African American man referred by Dr. Clydia Llano (his attending physician during a recent hospitalization for UTI >> SIRS), for management of DM2, insulin-dependent, uncontrolled, with complications (peripheral neuropathy, CKD with history of renal transplant in 2003, gastroparesis, and likely diabetic retinopathy, status post intraocular hemorrhage status post laser surgery).  Patient has been diagnosed with DM2 40 years ago. He has been on insulin from diagnosis. He developed chronic kidney disease, and was on dialysis for about 3 years before having a kidney transplant 10 years ago: he now takes Prednisone 5, Cellcept, Cyclosporine. He sees Dr. Arrie Aran with nephrology. He has many members of the family with DM.  Last hemoglobin A1c: Lab Results  Component Value Date   HGBA1C 7.4* 01/02/2013  prior to this, approximately a month ago, he was 7.2%.  He was on Lantus 20 units for several years (no other medication) - taking it in am. He had well controlled sugars, as I was able to see in his nicely kept CBG log, however, this became very expensive for him- 90$ a bottle. During his past hospitalization, he has been switched to insulin 70/30, and takes 10 units in a.m. And 10 in p.m. Right after starting this regimen, he had a low CBGs in the hospital, at 52, and the wife tells me that he was not eating well at that time. He had a previous low on 11/28/2012, at 70, and few other values a little higher. His wife tells me that his appetite is down, and he lost some weight too. I also reviewed his sugar values since he arrived home from the hospital, and these are consistently in the 200s and even one value at 400. The higher value was on a regimen of 10 units of 70/30 in a.m. and 6 in p.m. He then increase it to 10 units twice a day, but his sugar stayed in the 200s. Of note,  he is regularly on 5 mg of prednisone daily, but she was started on a taper which he finished yesterday.  His BP regimen changed after discharge from the hospital: Labetalol, Lasix, Norvasc. His blood pressure today is high, at 168/60, but the patient has increased pain in his left knee, after he fell yesterday on his knees. He did not lose consciousness, but just felt that his legs were weak and gave way.   I reviewed his chart including office notes, telephone notes, labs, and available scanned records. His last TSH was 0.579 (11/28/2012). He also had a cortisol drawn which was 11.5 at 3 PM on 01/02/2013. He had a previous hospitalization last month for viral gastroenteritis, UTI complicated with Enterobacter bacteremia, bronchitis, and he was mentioned at that time that he had hypoglycemia because of taking insulin and had poor po intake. He has gastroparesis (on Reglan), CKD, peripheral neuropathy (on Gabapentin - 300 mg bid), he has DR and had laser surgery for bleeding.   Review of Systems Constitutional: + weight loss, decrease appetite, no fatigue, plus subjective hyperthermia Eyes: no blurry vision, no xerophthalmia ENT: no sore throat, no nodules palpated in throat, no dysphagia/odynophagia, no hoarseness Cardiovascular: no CP/SOB/palpitations/leg swelling Respiratory: no cough/SOB Gastrointestinal: plus N/V/no D/C Musculoskeletal: plus muscle/plus joint aches - left knee Skin: no rashes Neurological: plus tremors/no numbness/tingling/dizziness Psychiatric: no depression/anxiety Low libido, difficulty with erections Past Medical History  Diagnosis Date  .  Diabetes mellitus   . Hypertension     Past Surgical History  Procedure Laterality Date  . Kidney transplant     History   Social History  . Marital Status: Married    Spouse Name: N/A    Number of Children: 5   Social History Main Topics  . Smoking status: Former Games developer  . Smokeless tobacco: Never Used  . Alcohol  Use: No  . Drug Use: No  . Sexually Active: No   Name  Route  Sig  . acetaminophen (ARTHRITIS PAIN RELIEF) 650 MG CR tablet   Oral   Take 650-1,300 mg by mouth 2 (two) times daily as needed (arthritis).    Marland Kitchen albuterol (PROVENTIL HFA;VENTOLIN HFA) 108 (90 BASE) MCG/ACT inhaler   Inhalation   Inhale 2 puffs into the lungs every 4 (four) hours as needed for wheezing.   Marland Kitchen amLODipine (NORVASC) 10 MG tablet   Oral   Take 1 tablet (10 mg total) by mouth daily.   Marland Kitchen aspirin EC 81 MG tablet   Oral   Take 81 mg by mouth daily.   Marland Kitchen azelastine (OPTIVAR) 0.05 % ophthalmic solution   Both Eyes   Place 1 drop into both eyes 2 (two) times daily.   . capsaicin (ZOSTRIX) 0.025 % cream   Topical   Apply 1 application topically 4 (four) times daily - after meals and at bedtime.   . ciprofloxacin (CIPRO) 500 MG tablet   Oral   Take 1 tablet (500 mg total) by mouth 2 (two) times daily.   . cycloSPORINE modified (NEORAL) 100 MG capsule   Oral   Take 100 mg by mouth 2 (two) times daily. In addition to 50mg  twice daily for total 150 mg twice daily.   . cycloSPORINE modified (NEORAL) 25 MG capsule   Oral   Take 50 mg by mouth 2 (two) times daily. In addition to 100mg  po twice daily for total dose of 150mg  twice daily.   . fexofenadine (ALLEGRA) 180 MG tablet   Oral   Take 180 mg by mouth daily as needed. For allergies   . fluticasone (FLONASE) 50 MCG/ACT nasal spray   Nasal   Place 1 spray into the nose daily as needed. Allergies or sinus congestion.   . furosemide (LASIX) 80 MG tablet   Oral   Take 80 mg by mouth daily.     Marland Kitchen gabapentin (NEURONTIN) 300 MG capsule   Oral   Take 1 capsule (300 mg total) by mouth 2 (two) times daily.   Marland Kitchen labetalol (NORMODYNE) 300 MG tablet   Oral   Take 0.5 tablets (150 mg total) by mouth 2 (two) times daily.   . metoCLOPramide (REGLAN) 10 MG tablet   Oral   Take 1 tablet (10 mg total) by mouth daily.   . mycophenolate (CELLCEPT) 250 MG capsule    Oral   Take 1,000 mg by mouth 2 (two) times daily.   Marland Kitchen omeprazole (PRILOSEC) 20 MG capsule   Oral   Take 20 mg by mouth daily.     . pravastatin (PRAVACHOL) 80 MG tablet   Oral   Take 80 mg by mouth at bedtime.   . predniSONE (DELTASONE) 10 MG tablet      Take 4 tablets on 2/20 Take 3 tablets on 2/21 Take 2 tablets on 2/22 Take 1 tablet on 2/23 Then resume taking the prednisone 5 mg daily.   . predniSONE (DELTASONE) 5 MG tablet   Oral   Take  1 tablet (5 mg total) by mouth daily. Resume the prednisone 5 mg after your finished the 50 mg taper.   . sodium bicarbonate 650 MG tablet   Oral   Take 1 tablet (650 mg total) by mouth 2 (two) times daily.   . Tamsulosin HCl (FLOMAX) 0.4 MG CAPS   Oral   Take 1 capsule (0.4 mg total) by mouth daily.   Was taking insulin 70/30 10 units twice a day Gabapentin 300 mg twice a day Reglan 10 mg daily   No current facility-administered medications on file prior to visit.   Allergies  Allergen Reactions  . Iodine Anaphylaxis    Iv dye  . Shellfish Allergy Anaphylaxis and Swelling    Has throat swelling, tongue swelling, and entire body swells up.    FH: extensive family history of diabetes in mother, brother, and sisters Also family history of hypertension and heart problems  Objective:   Physical Exam BP 168/60  Pulse 67  Temp(Src) 97.8 F (36.6 C) (Oral)  Resp 16  Wt 259 lb (117.482 kg)  SpO2 97% Wt Readings from Last 3 Encounters:  01/10/13 259 lb (117.482 kg)  01/02/13 258 lb 9.6 oz (117.3 kg)  11/29/12 278 lb 14.1 oz (126.5 kg)  Constitutional: overweight, in NAD, appears weak, walks with a walker Eyes: PERRLA, EOMI, no exophthalmos ENT: moist mucous membranes, no thyromegaly, no cervical lymphadenopathy Cardiovascular: RRR, No MRG Respiratory: CTA B Gastrointestinal: abdomen soft, NT, ND, BS+ Musculoskeletal: no deformities, strength intact in all 4, bilateral pitting edema +2 in lower extremities,no increased  swelling in his left leg or any skin lesions Skin: moist, warm, no rashes Neurological: no tremor with outstretched hands, DTR - could not elicit     Assessment:     1. DM2, insulin-dependent, uncontrolled, with complications  - peripheral neuropathy - CKD with history of renal transplant in 2003 - gastroparesis - likely diabetic retinopathy, status post intraocular hemorrhage status post laser surgery  Plan:     Patient has a very long history of diabetes, and low insulin requirements, but has recently taken off his Lantus 20 units in a.m. regimen because of high co-pay. Per his log, his a.m. CBGs were well controlled on Lantus. He did not check sugars throughout the day on Lantus, but it is unlikely that they were very high, since his hemoglobin A1c is only 7.4%.  - during his last hospitalization he was started on insulin 70/30, but started to have high sugars, possibly explained by his prednisone taper. Rather than increasing the dose of his 70/30 (for which the patient pays approximately $25 a month), I decided to take him off this medication because of the inflexibility of the dosage and possible peak in the basal insulin included in this formulation.  - Will start Levemir, 20 units in at bedtime, which has a shorter action time then Lantus (20 hours versus 24 hours), however because of his CKD, he has decreased insulin clearance, and his insulin might last him for 24 hours. I gave him a voucher so that he pays only $25 a month for Levemir for the next 2 years.  - I am not sure yet whether we need to use mealtime insulin, but will evaluate for this at the next visit in a month - advised how to fill his sugar log (at least twice a day) and to bring it at next appt - given foot care handout and explained the principles - given instructions for hypoglycemia management "15-15 rule" -  I referred him for a primary care physician in the [facility] system - I refilled his gabapentin,  metoclopramide, and Levemir - I will see him back in a month with his log  2. Left knee pain - the patient does not think that he can go home with this pain, and they decided to go to the emergency room or urgent care. On exam, he has bilateral pitting edema +2, but I do not appreciate increased swelling in his left leg or any skin lesions.

## 2013-01-10 NOTE — ED Notes (Signed)
Pt refused to ambulate. RN notified.

## 2013-01-10 NOTE — ED Provider Notes (Signed)
Medical screening examination/treatment/procedure(s) were conducted as a shared visit with non-physician practitioner(s) and myself.  I personally evaluated the patient during the encounter  Patient seen examined and describes having syncopal event yesterday with out evidence of seizure activity. Patient did sustain a left knee injury. Labs and x-rays reviewed. Will consult hospitalist  Toy Baker, MD 01/10/13 [phone]

## 2013-01-11 DIAGNOSIS — J209 Acute bronchitis, unspecified: Secondary | ICD-10-CM

## 2013-01-11 DIAGNOSIS — Z7689 Persons encountering health services in other specified circumstances: Secondary | ICD-10-CM | POA: Insufficient documentation

## 2013-01-11 LAB — GLUCOSE, CAPILLARY
Glucose-Capillary: 253 mg/dL — ABNORMAL HIGH (ref 70–99)
Glucose-Capillary: 216 mg/dL — ABNORMAL HIGH (ref 70–99)
Glucose-Capillary: 230 mg/dL — ABNORMAL HIGH (ref 70–99)

## 2013-01-11 LAB — COMPREHENSIVE METABOLIC PANEL
CO2: 23 mEq/L (ref 19–32)
Calcium: 8.8 mg/dL (ref 8.4–10.5)
Creatinine, Ser: 1.79 mg/dL — ABNORMAL HIGH (ref 0.50–1.35)
GFR calc Af Amer: 45 mL/min — ABNORMAL LOW (ref >90)
GFR calc non Af Amer: 39 mL/min — ABNORMAL LOW (ref >90)
Glucose, Bld: 273 mg/dL — ABNORMAL HIGH (ref 70–99)

## 2013-01-11 LAB — CBC
Hemoglobin: 10 g/dL — ABNORMAL LOW (ref 13.0–17.0)
RBC: 3.66 MIL/uL — ABNORMAL LOW (ref 4.22–5.81)

## 2013-01-11 MED ORDER — INSULIN ASPART 100 UNIT/ML ~~LOC~~ SOLN
0.0000 [IU] | Freq: Three times a day (TID) | SUBCUTANEOUS | Status: DC
  Administered 2013-01-11 (×2): 5 [IU] via SUBCUTANEOUS
  Administered 2013-01-11 – 2013-01-12 (×4): 3 [IU] via SUBCUTANEOUS
  Administered 2013-01-13: 08:00:00 via SUBCUTANEOUS

## 2013-01-11 NOTE — Progress Notes (Signed)
[facility] PROGRESS NOTE  [name] XBJ:[id number] DOB: 07/15/1951 DOA: 01/10/2013 PCP: No primary provider on file.  HPI/Subjective: Feels much better, denies any dizziness or knee pain  Assessment/Plan:  Syncope -Patient has orthostatic hypotension, this is apparent from the orthostatic vital signs. -Patient also had some typical vasovagal attack, the syncopal episode happen after he went to the bathroom. -his amlodipine is on hold. -Labetalol and furosemide continued. Blood pressure currently stable.   Bilateral knee pain -Secondary to effusion, likely exacerbated by the recent fall. -Patient seen by Lachman of the orthopedic service, status post intra-articular steroid injection. -Patient ambulated without pain today with physical therapy  Diabetes mellitus type 2 -Patient is on Levemir 20 units each bedtime. -Complicated diabetes with diabetic gastropathy and neuropathy. Patient is on gabapentin 300 mg twice a day rate  Status post renal transplant -He is on cyclosporin 150 mg twice a day, prednisone 5 mg and mycophenolate 1000 mg twice a day. -Patient follows with Dr. Arrie Aran. -He is on Lasix 80 mg that is continued throughout the hospital stay.  Recent UTI -Patient had recent UTI with Enterobacter genus which was susceptible to ciprofloxacin. -Patient recently finished 7 days of oral ciprofloxacin.  Code Status: Full code Family Communication: Plan discussed with the patient Disposition Plan: Observation, likely to be discharged in the morning   Consultants:  Allie Bossier  Procedures:  Arthrocentesis and intra-articular steroid injection  Antibiotics:  None   Objective: Filed Vitals:   01/11/13 0602 01/11/13 1048 01/11/13 1052 01/11/13 1300  BP: 137/71 134/70 124/68 131/62  Pulse: 65 68 72 65  Temp: 98.1 F (36.7 C)   97.9 F (36.6 C)  TempSrc: Oral     Resp: 16   18  Height:      Weight:      SpO2: 97%   100%     Intake/Output Summary (Last 24 hours) at 01/11/13 1359 Last data filed at 01/11/13 0601  Gross per 24 hour  Intake   2120 ml  Output   1675 ml  Net    445 ml   Filed Weights   01/10/13 2216  Weight: 112.1 kg (247 lb 2.2 oz)    Exam:  General: Alert and awake, oriented x3, not in any acute distress. HEENT: anicteric sclera, pupils reactive to light and accommodation, EOMI CVS: S1-S2 clear, no murmur rubs or gallops Chest: clear to auscultation bilaterally, no wheezing, rales or rhonchi Abdomen: soft nontender, nondistended, normal bowel sounds, no organomegaly Extremities: no cyanosis, clubbing or edema noted bilaterally Neuro: Cranial nerves II-XII intact, no focal neurological deficits   Data Reviewed: Basic Metabolic Panel:  Recent Labs Lab 01/10/13 1502 01/11/13 0638  NA 134* 133*  K 3.7 4.6  CL 96 96  CO2 25 23  GLUCOSE 84 273*  BUN 41* 42*  CREATININE 2.02* 1.79*  CALCIUM 9.2 8.8   Liver Function Tests:  Recent Labs Lab 01/10/13 1502 01/11/13 0638  AST 14 11  ALT 14 12  ALKPHOS 59 58  BILITOT 0.6 0.6  PROT 7.2 6.8  ALBUMIN 3.5 3.1*   No results found for this basename: LIPASE, AMYLASE,  in the last 168 hours No results found for this basename: AMMONIA,  in the last 168 hours CBC:  Recent Labs Lab 01/10/13 1502 01/11/13 0638  WBC 9.8 8.0  NEUTROABS 7.4  --   HGB 10.6* 10.0*  HCT 32.2* 30.3*  MCV 83.9 82.8  PLT 274 267   Cardiac Enzymes: No results found for this  basename: CKTOTAL, CKMB, CKMBINDEX, TROPONINI,  in the last 168 hours BNP (last 3 results)  Recent Labs  12/01/12 0943  PROBNP 4046.0*   CBG:  Recent Labs Lab 01/05/13 1408 01/10/13 2201 01/11/13 0835 01/11/13 0918 01/11/13 1159  GLUCAP 133* 194* 253* 286* 216*    Recent Results (from the past 240 hour(s))  CULTURE, BLOOD (ROUTINE X 2)     Status: None   Collection Time    01/02/13 12:23 PM      Result Value Range Status   Specimen Description BLOOD RIGHT  ARM   Final   Special Requests BOTTLES DRAWN AEROBIC AND ANAEROBIC   Final   Culture  Setup Time 01/02/2013 17:32   Final   Culture NO GROWTH 5 DAYS   Final   Report Status 01/08/2013 FINAL   Final  URINE CULTURE     Status: None   Collection Time    01/02/13 12:25 PM      Result Value Range Status   Specimen Description URINE, RANDOM   Final   Special Requests NONE   Final   Culture  Setup Time 01/02/2013 20:15   Final   Colony Count >=100,000 COLONIES/ML   Final   Culture ENTEROBACTER AEROGENES   Final   Report Status 01/04/2013 FINAL   Final   Organism ID, Bacteria ENTEROBACTER AEROGENES   Final  CULTURE, BLOOD (ROUTINE X 2)     Status: None   Collection Time    01/02/13 12:30 PM      Result Value Range Status   Specimen Description BLOOD RIGHT HAND   Final   Special Requests BOTTLES DRAWN AEROBIC ONLY 1CC   Final   Culture  Setup Time 01/02/2013 17:33   Final   Culture NO GROWTH 5 DAYS   Final   Report Status 01/08/2013 FINAL   Final     Studies: Dg Chest 2 View  01/10/2013  *RADIOLOGY REPORT*  Clinical Data: Fall with pain.  Diabetes.  CHEST - 2 VIEW  Comparison: 01/02/2013  Findings: Upper limits normal heart size noted. The lungs are clear. There is no evidence of focal airspace disease, pulmonary edema, suspicious pulmonary nodule/mass, pleural effusion, or pneumothorax. No acute bony abnormalities are identified.  IMPRESSION: No evidence of acute cardiopulmonary disease.   Original Report Authenticated By: Harmon Pier, M.D.    Ct Head Wo Contrast  01/10/2013  *RADIOLOGY REPORT*  Clinical Data: Fall yesterday.  The the patient awoke on the floor.  CT HEAD WITHOUT CONTRAST  Technique:  Contiguous axial images were obtained from the base of the skull through the vertex without contrast.  Comparison: MRI brain 02/20/2089.  Findings: No acute infarct, hemorrhage, or mass lesion is present. Mild subcortical white matter disease is similar to the prior exam. No acute infarct,  hemorrhage, mass lesion is present.  Left parietal occipital scalp soft tissue swelling is noted.  There is no underlying fracture or dye stasis.  The anterior left ethmoid air cells and frontal sinuses are chronically opacified.  Wall thickening in the posterolateral maxillary sinus bilaterally suggests a history of chronic or recurrent disease.  There is no active disease.  The remaining paranasal sinuses and mastoid air cells are clear. Atherosclerotic calcifications are present within the cavernous carotid arteries bilaterally.  IMPRESSION:  1.  Stable atrophy and white matter disease. 2.  No acute intracranial abnormality. 3.  Left parietal occipital scalp soft tissue swelling without underlying fracture or dye stasis. 4.  Chronic anterior left ethmoid and  frontal sinus disease. 5.  Atherosclerosis.   Original Report Authenticated By: Marin Roberts, M.D.    Dg Knee Complete 4 Views Left  01/10/2013  *RADIOLOGY REPORT*  Clinical Data: Fall, leg pain  LEFT KNEE - COMPLETE 4+ VIEW  Comparison: None.  Findings: No fracture or dislocation is seen.  Mild to moderate tricompartmental degenerative changes.  Moderate suprapatellar knee joint effusion.  Vascular calcifications pain  IMPRESSION: No fracture or dislocation is seen.  Mild to moderate degenerative changes.  Moderate suprapatellar knee joint effusion.   Original Report Authenticated By: Charline Bills, M.D.     Scheduled Meds: . aspirin EC  81 mg Oral Daily  . capsaicin  1 application Topical TID PC & HS  . cycloSPORINE modified  100 mg Oral BID   And  . cycloSPORINE modified  50 mg Oral BID  . furosemide  80 mg Oral Daily  . gabapentin  300 mg Oral BID  . heparin  5,000 Units Subcutaneous Q8H  . insulin aspart  0-9 Units Subcutaneous TID WC  . insulin detemir  20 Units Subcutaneous QHS  . labetalol  150 mg Oral BID  . metoCLOPramide  10 mg Oral Daily  . mycophenolate  1,000 mg Oral BID  . pantoprazole  40 mg Oral Daily  .  pravastatin  80 mg Oral QHS  . predniSONE  5 mg Oral QAC breakfast  . sodium bicarbonate  650 mg Oral BID  . sodium chloride  3 mL Intravenous Q12H   Continuous Infusions:   Active Problems:   DM 2   DIABETIC PERIPHERAL NEUROPATHY   HYPERTENSION, UNSPECIFIED   VENTRICULAR HYPERTROPHY, LEFT   SLEEP APNEA   History of renal transplant   Gram-negative bacteremia   Knee pain, left anterior   Orthostatic syncope    Time spent: 35 minutes    [facility]  [facility] Pager [phone]. If 8PM-8AM, please contact night-coverage at [email or web address], password [facility] 01/11/2013, 1:59 PM  LOS: 1 day

## 2013-01-11 NOTE — Progress Notes (Signed)
Admitted pt.to 5511,from ED,bec.of fall & pain on his knees.He is AAO X4,from home with wife,& was just discharged last WED.for Sepsis.His knees are swollen & as per pt.they just aspirated fluids on his knees in ED.Placed pt.on tele.& instructed to call the nurse for any help.Made comfortable in bed.Will continue to monitor pt.

## 2013-01-11 NOTE — Progress Notes (Signed)
Patient ID: [name], male   DOB: [birth year], 62 y.o.   MRN: [id number] [name]'s knees fell much better today and are less swollen post aspiration and injection with a steroid.  He can continue to increase his activities as comfort allows and I can see him in the office in follow-up as an outpatient.

## 2013-01-11 NOTE — Progress Notes (Signed)
[facility]  Patient Status: Active (receiving services up to time of hospitalization)  [facility] is providing the following services: PT  If patient discharges after hours, please call [phone].   Wynelle Bourgeois 01/11/2013, 11:40 AM

## 2013-01-11 NOTE — Progress Notes (Signed)
Inpatient Diabetes Program Recommendations  AACE/ADA: New Consensus Statement on Inpatient Glycemic Control (2013)  Target Ranges:  Prepandial:   less than 140 mg/dL      Peak postprandial:   less than 180 mg/dL (1-2 hours)      Critically ill patients:  140 - 180 mg/dL   Reason for Visit: Hyperglycemia (fasting is high to start the day)  Inpatient Diabetes Program Recommendations Insulin - Basal: Please resume the home Levemir dose of 20 units  at HS  Note: Thank you, Lenor Coffin, RN, CNS, Diabetes Coordinator [phone])

## 2013-01-11 NOTE — Care Management Note (Addendum)
    Page 1 of 2   01/12/2013     10:41:40 AM   CARE MANAGEMENT NOTE 01/12/2013  Patient:  [name], [name]   Account Number:  [id number]  Date Initiated:  01/11/2013  Documentation initiated by:  Letha Cape  Subjective/Objective Assessment:   dx syncope  admit- lives with spouse. patient has a rollator.     Action/Plan:   resume hhpt   Anticipated DC Date:  01/12/2013   Anticipated DC Plan:  HOME W HOME HEALTH SERVICES      DC Planning Services  CM consult      [facility] Choice  HOME HEALTH  Resumption Of Svcs/PTA Provider   Choice offered to / List presented to:  C-1 Patient        HH arranged  HH-2 PT      [facility] agency  [facility]   Status of service:  Completed, signed off Medicare Important Message given?   (If response is "NO", the following Medicare IM given date fields will be blank) Date Medicare IM given:   Date Additional Medicare IM given:    Discharge Disposition:  HOME W HOME HEALTH SERVICES  Per UR Regulation:  Reviewed for med. necessity/level of care/duration of stay  If discussed at Long Length of Stay Meetings, dates discussed:    Comments:  01/12/13 10:40 Letha Cape RN, BSN [phone] patient is for discharge today, [facility] notified.  01/11/13 11:03 Letha Cape RN, BSN [phone] patient lives with spouse, patient uses a rollator at home. Will need to resume the [facility] services with [facility] for hhpt

## 2013-01-11 NOTE — Progress Notes (Signed)
Brief Nutrition Note:  RD pulled to pt for malnutrition screening tool. Pt indicated unintentional weight loss.   RD spoke with pt who states that weight loss of 75 lbs in the past 3 years was intentional. Appetite is normal.   Chart reviewed:  Body mass index is 32.61 kg/(m^2). Obesity class 1 Diet: Carb Mod Some additional weight loss would be appropriate for this pt.   No nutrition interventions warranted for this pt. Please consult as needed.   Clarene Duke RD, LDN Pager [phone] After Hours pager [phone]

## 2013-01-11 NOTE — Evaluation (Signed)
Physical Therapy Evaluation Patient Details Name: [name] MRN: [id number] DOB: 1951/03/04 Today's Date: 01/11/2013 Time: 4098-1191 PT Time Calculation (min): 25 min  PT Assessment / Plan / Recommendation Clinical Impression  Patient is 62 y/o male admitted s/p fall with brief syncope and with past medical history of DM, renal failure with transplant and recent enterobacter UTI  who presents with decreased independence due to knee injury and swelling with the fall.  Will benefit from skilled PT in the acute setting to address acute pain, edema, decreased balance and still some orthostasis with current need for walker to decrease fall risk.  Agree to resume HHPT upon discharge.    PT Assessment  Patient needs continued PT services    Follow Up Recommendations  Home health PT          Equipment Recommendations  None recommended by PT       Frequency Min 3X/week    Precautions / Restrictions Precautions Precautions: Fall Precaution Comments: orthostatic Restrictions Weight Bearing Restrictions: No   Pertinent Vitals/Pain 1-2/10 left knee with ambulation      Mobility  Bed Mobility Bed Mobility: Supine to Sit;Sit to Supine Supine to Sit: 6: Modified independent (Device/Increase time);HOB flat Sit to Supine: 6: Modified independent (Device/Increase time);HOB flat Transfers Sit to Stand: 6: Modified independent (Device/Increase time);From chair/3-in-1;With armrests;From bed;With upper extremity assist Stand to Sit: 6: Modified independent (Device/Increase time);To chair/3-in-1;To bed;With armrests;With upper extremity assist Ambulation/Gait Ambulation/Gait Assistance: 5: Supervision;4: Min guard Ambulation Distance (Feet): 220 Feet Assistive device: Rolling walker Ambulation/Gait Assistance Details: only noted knees buckling with turns in room, but without loss of balance. Gait Pattern: Step-through pattern;Decreased hip/knee flexion - right;Decreased hip/knee  flexion - left;Decreased stride length;Trunk flexed    Exercises Other Exercises Other Exercises: encouraged knee arom and walks in hall with assist till discharged. Other Exercises: educated in safety with issues with orthostasis   PT Diagnosis: Abnormality of gait;Acute pain  PT Problem List: Decreased activity tolerance;Decreased balance;Pain;Decreased mobility PT Treatment Interventions: Gait training;DME instruction;Functional mobility training;Therapeutic activities;Therapeutic exercise;Patient/family education;Balance training   PT Goals Acute Rehab PT Goals Time For Goal Achievement: 01/21/13 Potential to Achieve Goals: Good Pt will Ambulate: >150 feet;with modified independence;with least restrictive assistive device PT Goal: Ambulate - Progress: Goal set today Pt will Perform Home Exercise Program: Independently PT Goal: Perform Home Exercise Program - Progress: Goal set today  Visit Information  Last PT Received On: 01/11/13 Assistance Needed: +1    Subjective Data  Subjective: Think it was due to medications that I fell/passed out. Patient Stated Goal: To return home ASAP.   Prior Functioning  Home Living Lives With: Spouse Available Help at Discharge: Family;Available 24 hours/day Type of Home: House Home Access: Level entry Home Layout: One level Bathroom Shower/Tub: Walk-in shower Home Adaptive Equipment: Environmental consultant - four wheeled;Walker - rolling Prior Function Level of Independence: Independent with assistive device(s) Driving: Yes Communication Communication: No difficulties    Cognition  Cognition Overall Cognitive Status: Appears within functional limits for tasks assessed/performed Arousal/Alertness: Awake/alert Orientation Level: Appears intact for tasks assessed Behavior During Session: [facility] for tasks performed    Extremity/Trunk Assessment Right Lower Extremity Assessment RLE ROM/Strength/Tone: [facility] for tasks assessed (with edema at knees) RLE  Sensation: Deficits;History of peripheral neuropathy RLE Sensation Deficits: diminished on both feet to light touch Left Lower Extremity Assessment LLE ROM/Strength/Tone: [facility] for tasks assessed (with edema and slight pain in knees with testing) LLE Sensation: Deficits;History of peripheral neuropathy LLE Sensation Deficits: diminished on both feet  to light touch   Balance Static Standing Balance Static Standing - Balance Support: No upper extremity supported Static Standing - Level of Assistance: 5: Stand by assistance  End of Session PT - End of Session Equipment Utilized During Treatment: Gait belt Activity Tolerance: Patient tolerated treatment well Patient left: in chair;with family/visitor present  GP Functional Assessment Tool Used: Clinical Judgement Functional Limitation: Mobility: Walking and moving around Mobility: Walking and Moving Around Current Status [phone]): At least 20 percent but less than 40 percent impaired, limited or restricted Mobility: Walking and Moving Around Goal Status [phone]): At least 1 percent but less than 20 percent impaired, limited or restricted   [facility] 01/11/2013, 11:23 AM  Sheran Lawless, PT [phone] 01/11/2013

## 2013-01-12 ENCOUNTER — Observation Stay (HOSPITAL_COMMUNITY): Payer: Medicare HMO

## 2013-01-12 LAB — GLUCOSE, CAPILLARY
Glucose-Capillary: 209 mg/dL — ABNORMAL HIGH (ref 70–99)
Glucose-Capillary: 224 mg/dL — ABNORMAL HIGH (ref 70–99)

## 2013-01-12 NOTE — Progress Notes (Signed)
Inpatient Diabetes Program Recommendations  AACE/ADA: New Consensus Statement on Inpatient Glycemic Control (2013)  Target Ranges:  Prepandial:   less than 140 mg/dL      Peak postprandial:   less than 180 mg/dL (1-2 hours)      Critically ill patients:  140 - 180 mg/dL   Reason for Visit: Results for [name], [name] (MRN [id number]) as of 01/12/2013 14:46  Ref. Range 01/12/2013 07:47 01/12/2013 12:43  Glucose-Capillary Latest Range: 70-99 mg/dL 409 (H) 811 (H)   CBG's continue to be greater than 200 mg/dL.  Consider increasing Novolog correction to moderate tid with meals and HS.  Thanks, Beryl Meager, RN, MSN, BC-ADM [phone])

## 2013-01-12 NOTE — Progress Notes (Signed)
PATIENT DETAILS Name: [name] Age: 62 y.o. Sex: male Date of Birth: [birth year] Admit Date: 01/10/2013 Admitting Physician Rhetta Mura, MD PCP:No primary provider on file.  Subjective: Anxious to go home today  Assessment/Plan: Active Problems: Syncope -from orthostatics-positive on admission -agree with stopping Amlodipine and Flomax -c/w Labetolol and Lasix  Bilateral knee pain -Secondary to effusion, likely exacerbated by the recent fall. -Patient seen by Blackhman of the orthopedic service, status post intra-articular steroid injection. -Seen by PT-will need HHPT on discharge  Recurrent Enterobacter UTI -spoke with Dr Zenaida Niece Dam-he suggests a CT Abdomen/Pelvis-to make sure no abscess -otherwise patient has already completed a course of Oral Cipro  Diabetes mellitus type 2 -moderate control -c/w Levemir and SSI  CKD Stage III -Monitor Lytes periodicall  Renal Transplant -c/w Immuno-suppresives   Disposition: Remain inpatient  DVT Prophylaxis: Prophylactic Heparin  Code Status: Full code   Procedures:  None  CONSULTS:  None  PHYSICAL EXAM: Vital signs in last 24 hours: Filed Vitals:   01/11/13 1300 01/11/13 2316 01/12/13 0507 01/12/13 1036  BP: 131/62 182/78 159/77 138/60  Pulse: 65 61 60 60  Temp: 97.9 F (36.6 C)  98.5 F (36.9 C)   TempSrc:   Oral   Resp: 18  20   Height:      Weight:      SpO2: 100%  100%     Weight change:  Body mass index is 32.61 kg/(m^2).   Gen Exam: Awake and alert with clear speech.   Neck: Supple, No JVD.   Chest: B/L Clear.   CVS: S1 S2 Regular, no murmurs.  Abdomen: soft, BS +, non tender, non distended.  Extremities: no edema, lower extremities warm to touch. Neurologic: Non Focal.   Skin: No Rash.   Wounds: N/A.    Intake/Output from previous day:  Intake/Output Summary (Last 24 hours) at 01/12/13 1214 Last data filed at 01/12/13 0950  Gross per 24 hour  Intake    600 ml  Output    1401 ml  Net   -801 ml     LAB RESULTS: CBC  Recent Labs Lab 01/10/13 1502 01/11/13 0638  WBC 9.8 8.0  HGB 10.6* 10.0*  HCT 32.2* 30.3*  PLT 274 267  MCV 83.9 82.8  MCH 27.6 27.3  MCHC 32.9 33.0  RDW 14.7 14.5  LYMPHSABS 1.3  --   MONOABS 1.0  --   EOSABS 0.2  --   BASOSABS 0.0  --     Chemistries   Recent Labs Lab 01/10/13 1502 01/11/13 0638  NA 134* 133*  K 3.7 4.6  CL 96 96  CO2 25 23  GLUCOSE 84 273*  BUN 41* 42*  CREATININE 2.02* 1.79*  CALCIUM 9.2 8.8    CBG:  Recent Labs Lab 01/11/13 1159 01/11/13 1541 01/11/13 1740 01/11/13 2155 01/12/13 0747  GLUCAP 216* 331* 350* 230* 211*    GFR Estimated Creatinine Clearance: 56.2 ml/min (by C-G formula based on Cr of 1.79).  Coagulation profile No results found for this basename: INR, PROTIME,  in the last 168 hours  Cardiac Enzymes No results found for this basename: CK, CKMB, TROPONINI, MYOGLOBIN,  in the last 168 hours  No components found with this basename: POCBNP,  No results found for this basename: DDIMER,  in the last 72 hours No results found for this basename: HGBA1C,  in the last 72 hours No results found for this basename: CHOL, HDL, LDLCALC, TRIG, CHOLHDL, LDLDIRECT,  in the last 72  hours No results found for this basename: TSH, T4TOTAL, FREET3, T3FREE, THYROIDAB,  in the last 72 hours No results found for this basename: VITAMINB12, FOLATE, FERRITIN, TIBC, IRON, RETICCTPCT,  in the last 72 hours No results found for this basename: LIPASE, AMYLASE,  in the last 72 hours  Urine Studies No results found for this basename: UACOL, UAPR, USPG, UPH, UTP, UGL, UKET, UBIL, UHGB, UNIT, UROB, ULEU, UEPI, UWBC, URBC, UBAC, CAST, CRYS, UCOM, BILUA,  in the last 72 hours  MICROBIOLOGY: Recent Results (from the past 240 hour(s))  CULTURE, BLOOD (ROUTINE X 2)     Status: None   Collection Time    01/02/13 12:23 PM      Result Value Range Status   Specimen Description BLOOD RIGHT ARM   Final    Special Requests BOTTLES DRAWN AEROBIC AND ANAEROBIC   Final   Culture  Setup Time 01/02/2013 17:32   Final   Culture NO GROWTH 5 DAYS   Final   Report Status 01/08/2013 FINAL   Final  URINE CULTURE     Status: None   Collection Time    01/02/13 12:25 PM      Result Value Range Status   Specimen Description URINE, RANDOM   Final   Special Requests NONE   Final   Culture  Setup Time 01/02/2013 20:15   Final   Colony Count >=100,000 COLONIES/ML   Final   Culture ENTEROBACTER AEROGENES   Final   Report Status 01/04/2013 FINAL   Final   Organism ID, Bacteria ENTEROBACTER AEROGENES   Final  CULTURE, BLOOD (ROUTINE X 2)     Status: None   Collection Time    01/02/13 12:30 PM      Result Value Range Status   Specimen Description BLOOD RIGHT HAND   Final   Special Requests BOTTLES DRAWN AEROBIC ONLY 1CC   Final   Culture  Setup Time 01/02/2013 17:33   Final   Culture NO GROWTH 5 DAYS   Final   Report Status 01/08/2013 FINAL   Final    RADIOLOGY STUDIES/RESULTS: Dg Chest 2 View  01/10/2013  *RADIOLOGY REPORT*  Clinical Data: Fall with pain.  Diabetes.  CHEST - 2 VIEW  Comparison: 01/02/2013  Findings: Upper limits normal heart size noted. The lungs are clear. There is no evidence of focal airspace disease, pulmonary edema, suspicious pulmonary nodule/mass, pleural effusion, or pneumothorax. No acute bony abnormalities are identified.  IMPRESSION: No evidence of acute cardiopulmonary disease.   Original Report Authenticated By: Harmon Pier, M.D.    Dg Chest 2 View  01/02/2013  *RADIOLOGY REPORT*  Clinical Data: Chest pain.  CHEST - 2 VIEW  Comparison: 12/02/2012  Findings: Cardiomegaly.  No effusions or edema.  Previously noted in edema has resolved.  Minimal right basilar density may reflect atelectasis. No acute bony abnormality.  IMPRESSION: Resolution of previously noted edema.  Minimal right base density, likely atelectasis.   Original Report Authenticated By: Charlett Nose, M.D.    Ct  Head Wo Contrast  01/10/2013  *RADIOLOGY REPORT*  Clinical Data: Fall yesterday.  The the patient awoke on the floor.  CT HEAD WITHOUT CONTRAST  Technique:  Contiguous axial images were obtained from the base of the skull through the vertex without contrast.  Comparison: MRI brain 02/20/2089.  Findings: No acute infarct, hemorrhage, or mass lesion is present. Mild subcortical white matter disease is similar to the prior exam. No acute infarct, hemorrhage, mass lesion is present.  Left parietal occipital  scalp soft tissue swelling is noted.  There is no underlying fracture or dye stasis.  The anterior left ethmoid air cells and frontal sinuses are chronically opacified.  Wall thickening in the posterolateral maxillary sinus bilaterally suggests a history of chronic or recurrent disease.  There is no active disease.  The remaining paranasal sinuses and mastoid air cells are clear. Atherosclerotic calcifications are present within the cavernous carotid arteries bilaterally.  IMPRESSION:  1.  Stable atrophy and white matter disease. 2.  No acute intracranial abnormality. 3.  Left parietal occipital scalp soft tissue swelling without underlying fracture or dye stasis. 4.  Chronic anterior left ethmoid and frontal sinus disease. 5.  Atherosclerosis.   Original Report Authenticated By: Marin Roberts, M.D.    US Abdomen Complete  01/02/2013  *RADIOLOGY REPORT*  Clinical Data:  Acute renal failure, service evaluate for pyelonephritis/renal abscess  COMPLETE ABDOMINAL ULTRASOUND  Comparison:  Abdominal ultrasound 11/29/2012  Findings:  Gallbladder:  No gallstones, gallbladder wall thickening, or pericholecystic fluid.  Common bile duct:  Limited visibility due to obstructing bowel gas. Visualized portion is within normal limits of 4.5 mm in caliber.  Liver:  No focal lesion identified.  Within normal limits in parenchymal echogenicity.  IVC:  Appears normal.  Pancreas:  Limited evaluation secondary to obscuring  bowel gas  Spleen:  Sonographically unremarkable.  8.4 cm in length.  Right Kidney:  Small highly echogenic native right kidney measures approximately 10 cm in length.  No appreciable flow.  Left Kidney:  Small highly echogenic native left kidney measures approximately 10 cm in length.  No appreciable vascular flow.  Transplant kidney:  Normal appearing right lower quadrant transplant kidney measures approximately 13.4 cm in length.  There is minimal prominence of the renal pelvis compared to prior. Normal renal cortical thickness and echogenicity.  Abdominal aorta:  No aneurysm identified.  IMPRESSION: 1.  Stable appearance of small echogenic native kidneys consistent with longstanding medical renal disease. 2.  Very mild pelviectasis of the right lower quadrant transplant kidney compared to prior.  Otherwise, the transplant kidney is unremarkable.   Original Report Authenticated By: Malachy Moan, M.D.    Dg Knee Complete 4 Views Left  01/10/2013  *RADIOLOGY REPORT*  Clinical Data: Fall, leg pain  LEFT KNEE - COMPLETE 4+ VIEW  Comparison: None.  Findings: No fracture or dislocation is seen.  Mild to moderate tricompartmental degenerative changes.  Moderate suprapatellar knee joint effusion.  Vascular calcifications pain  IMPRESSION: No fracture or dislocation is seen.  Mild to moderate degenerative changes.  Moderate suprapatellar knee joint effusion.   Original Report Authenticated By: Charline Bills, M.D.     MEDICATIONS: Scheduled Meds: . aspirin EC  81 mg Oral Daily  . capsaicin  1 application Topical TID PC & HS  . cycloSPORINE modified  100 mg Oral BID   And  . cycloSPORINE modified  50 mg Oral BID  . furosemide  80 mg Oral Daily  . gabapentin  300 mg Oral BID  . heparin  5,000 Units Subcutaneous Q8H  . insulin aspart  0-9 Units Subcutaneous TID WC  . insulin detemir  20 Units Subcutaneous QHS  . labetalol  150 mg Oral BID  . metoCLOPramide  10 mg Oral Daily  . mycophenolate  1,000  mg Oral BID  . pantoprazole  40 mg Oral Daily  . pravastatin  80 mg Oral QHS  . predniSONE  5 mg Oral QAC breakfast  . sodium bicarbonate  650 mg Oral BID  .  sodium chloride  3 mL Intravenous Q12H   Continuous Infusions:  PRN Meds:.albuterol, HYDROcodone-acetaminophen  Antibiotics: Anti-infectives   None       Jeoffrey Massed, MD  [facility] Pager:[phone]  If 7PM-7AM, please contact night-coverage [email or web address] Password [facility] 01/12/2013, 12:14 PM   LOS: 2 days

## 2013-01-13 DIAGNOSIS — I10 Essential (primary) hypertension: Secondary | ICD-10-CM

## 2013-01-13 DIAGNOSIS — E119 Type 2 diabetes mellitus without complications: Secondary | ICD-10-CM

## 2013-01-13 NOTE — Progress Notes (Signed)
Patient discharge teaching given, including activity, diet, follow-up appoints, and medications. Patient verbalized understanding of all discharge instructions. IV access was d/c'd. Vitals are stable. Skin is intact except as charted in most recent assessments. Pt to be escorted out by NT, to be driven home by family. 

## 2013-01-13 NOTE — Discharge Summary (Signed)
PATIENT DETAILS Name: [name] Age: 62 y.o. Sex: male Date of Birth: [birth year] MRN: [id number]. Admit Date: 01/10/2013 Admitting Physician: Rhetta Mura, MD PCP:No primary provider on file.  Recommendations for Outpatient Follow-up:  1. Optimize Diabetic Control 2. Assess BP Control-stopped   PRIMARY DISCHARGE DIAGNOSIS:  Active Problems:   DM 2   DIABETIC PERIPHERAL NEUROPATHY   HYPERTENSION, UNSPECIFIED   VENTRICULAR HYPERTROPHY, LEFT   SLEEP APNEA   History of renal transplant   Gram-negative bacteremia   Knee pain, left anterior   Orthostatic syncope      PAST MEDICAL HISTORY: Past Medical History  Diagnosis Date  . Diabetes mellitus   . Hypertension   . Kidney transplant as cause of abnormal reaction or later complication     In [name]-Dr. Colodonato-Dr. Lawerance Cruel is the Transplant dodctor at [facility]    DISCHARGE MEDICATIONS:   Medication List    STOP taking these medications       amLODipine 10 MG tablet  Commonly known as:  NORVASC     ciprofloxacin 500 MG tablet  Commonly known as:  CIPRO     tamsulosin 0.4 MG Caps  Commonly known as:  FLOMAX      TAKE these medications       albuterol 108 (90 BASE) MCG/ACT inhaler  Commonly known as:  PROVENTIL HFA;VENTOLIN HFA  Inhale 2 puffs into the lungs every 4 (four) hours as needed for wheezing.     ARTHRITIS PAIN RELIEF 650 MG CR tablet  Generic drug:  acetaminophen  Take 650-1,300 mg by mouth 2 (two) times daily as needed (arthritis).     aspirin EC 81 MG tablet  Take 81 mg by mouth daily.     capsaicin 0.025 % cream  Commonly known as:  ZOSTRIX  Apply 1 application topically 4 (four) times daily - after meals and at bedtime.     cycloSPORINE modified 25 MG capsule  Commonly known as:  NEORAL  Take 50 mg by mouth 2 (two) times daily. In addition to 100mg  po twice daily for total dose of 150mg  twice daily.     cycloSPORINE modified 100 MG capsule  Commonly known as:  NEORAL   Take 100 mg by mouth 2 (two) times daily. In addition to 50mg  twice daily for total 150 mg twice daily.     fexofenadine 180 MG tablet  Commonly known as:  ALLEGRA  Take 180 mg by mouth daily as needed. For allergies     fluticasone 50 MCG/ACT nasal spray  Commonly known as:  FLONASE  Place 1 spray into the nose daily as needed. Allergies or sinus congestion.     furosemide 80 MG tablet  Commonly known as:  LASIX  Take 80 mg by mouth daily.     gabapentin 300 MG capsule  Commonly known as:  NEURONTIN  Take 1 capsule (300 mg total) by mouth 2 (two) times daily.     insulin detemir 100 UNIT/ML injection  Commonly known as:  LEVEMIR  Inject 20 Units into the skin at bedtime.     labetalol 300 MG tablet  Commonly known as:  NORMODYNE  Take 0.5 tablets (150 mg total) by mouth 2 (two) times daily.     metoCLOPramide 10 MG tablet  Commonly known as:  REGLAN  Take 1 tablet (10 mg total) by mouth daily.     mycophenolate 250 MG capsule  Commonly known as:  CELLCEPT  Take 1,000 mg by mouth 2 (two) times daily.  omeprazole 20 MG capsule  Commonly known as:  PRILOSEC  Take 20 mg by mouth daily.     pravastatin 80 MG tablet  Commonly known as:  PRAVACHOL  Take 80 mg by mouth at bedtime.     predniSONE 5 MG tablet  Commonly known as:  DELTASONE  Take 1 tablet (5 mg total) by mouth daily. Resume the prednisone 5 mg after your finished the 50 mg taper.     sodium bicarbonate 650 MG tablet  Take 1 tablet (650 mg total) by mouth 2 (two) times daily.         BRIEF HPI:  See H&P, Labs, Consult and Test reports for all details in brief, patient [name] is a 62 y.o. male recently d/c form [facility] hospital with sepsis 2/2 to urinary origin who presented form Endocrinology office 2/2 to a fall  CONSULTATIONS:   orthopedic surgery  PERTINENT RADIOLOGIC STUDIES: Ct Abdomen Pelvis Wo Contrast  01/12/2013  *RADIOLOGY REPORT*  Clinical Data: 62 year old male with recurrent  urinary tract infection and possible abscess.  History of renal transplant.  CT ABDOMEN AND PELVIS WITHOUT CONTRAST  Technique:  Multidetector CT imaging of the abdomen and pelvis was performed following the standard protocol without intravenous contrast.  Comparison: 01/02/2013 ultrasound  Findings: Cardiomegaly noted. A small hiatal hernia is present.  The liver, gallbladder, spleen, adrenal glands, pancreas and gallbladder are unremarkable. Extremely atrophic native kidneys bilaterally are noted with a right pelvic transplant kidney.  No definite renal transplant abnormality is noted.  Please note that parenchymal abnormalities may be missed as intravenous contrast was not administered.  No free fluid, enlarged lymph nodes, biliary dilation or abdominal aortic aneurysm identified.  A moderate to large amount of stool throughout the colon noted. No other bowel abnormalities are present. The appendix is normal. The bladder is unremarkable.  There is no evidence of abscess.  No acute or suspicious bony abnormalities are identified.  IMPRESSION: No evidence of acute abnormality.  No evidence of abscess.  Right pelvic transplant kidney without definite abnormality on this noncontrast study.  Extremely atrophic native kidneys.  Cardiomegaly and small hiatal hernia.   Original Report Authenticated By: Harmon Pier, M.D.    Dg Chest 2 View  01/10/2013  *RADIOLOGY REPORT*  Clinical Data: Fall with pain.  Diabetes.  CHEST - 2 VIEW  Comparison: 01/02/2013  Findings: Upper limits normal heart size noted. The lungs are clear. There is no evidence of focal airspace disease, pulmonary edema, suspicious pulmonary nodule/mass, pleural effusion, or pneumothorax. No acute bony abnormalities are identified.  IMPRESSION: No evidence of acute cardiopulmonary disease.   Original Report Authenticated By: Harmon Pier, M.D.    Dg Chest 2 View  01/02/2013  *RADIOLOGY REPORT*  Clinical Data: Chest pain.  CHEST - 2 VIEW  Comparison:  12/02/2012  Findings: Cardiomegaly.  No effusions or edema.  Previously noted in edema has resolved.  Minimal right basilar density may reflect atelectasis. No acute bony abnormality.  IMPRESSION: Resolution of previously noted edema.  Minimal right base density, likely atelectasis.   Original Report Authenticated By: Charlett Nose, M.D.    Ct Head Wo Contrast  01/10/2013  *RADIOLOGY REPORT*  Clinical Data: Fall yesterday.  The the patient awoke on the floor.  CT HEAD WITHOUT CONTRAST  Technique:  Contiguous axial images were obtained from the base of the skull through the vertex without contrast.  Comparison: MRI brain 02/20/2089.  Findings: No acute infarct, hemorrhage, or mass lesion is present. Mild subcortical  white matter disease is similar to the prior exam. No acute infarct, hemorrhage, mass lesion is present.  Left parietal occipital scalp soft tissue swelling is noted.  There is no underlying fracture or dye stasis.  The anterior left ethmoid air cells and frontal sinuses are chronically opacified.  Wall thickening in the posterolateral maxillary sinus bilaterally suggests a history of chronic or recurrent disease.  There is no active disease.  The remaining paranasal sinuses and mastoid air cells are clear. Atherosclerotic calcifications are present within the cavernous carotid arteries bilaterally.  IMPRESSION:  1.  Stable atrophy and white matter disease. 2.  No acute intracranial abnormality. 3.  Left parietal occipital scalp soft tissue swelling without underlying fracture or dye stasis. 4.  Chronic anterior left ethmoid and frontal sinus disease. 5.  Atherosclerosis.   Original Report Authenticated By: Marin Roberts, M.D.    US Abdomen Complete  01/02/2013  *RADIOLOGY REPORT*  Clinical Data:  Acute renal failure, service evaluate for pyelonephritis/renal abscess  COMPLETE ABDOMINAL ULTRASOUND  Comparison:  Abdominal ultrasound 11/29/2012  Findings:  Gallbladder:  No gallstones, gallbladder  wall thickening, or pericholecystic fluid.  Common bile duct:  Limited visibility due to obstructing bowel gas. Visualized portion is within normal limits of 4.5 mm in caliber.  Liver:  No focal lesion identified.  Within normal limits in parenchymal echogenicity.  IVC:  Appears normal.  Pancreas:  Limited evaluation secondary to obscuring bowel gas  Spleen:  Sonographically unremarkable.  8.4 cm in length.  Right Kidney:  Small highly echogenic native right kidney measures approximately 10 cm in length.  No appreciable flow.  Left Kidney:  Small highly echogenic native left kidney measures approximately 10 cm in length.  No appreciable vascular flow.  Transplant kidney:  Normal appearing right lower quadrant transplant kidney measures approximately 13.4 cm in length.  There is minimal prominence of the renal pelvis compared to prior. Normal renal cortical thickness and echogenicity.  Abdominal aorta:  No aneurysm identified.  IMPRESSION: 1.  Stable appearance of small echogenic native kidneys consistent with longstanding medical renal disease. 2.  Very mild pelviectasis of the right lower quadrant transplant kidney compared to prior.  Otherwise, the transplant kidney is unremarkable.   Original Report Authenticated By: Malachy Moan, M.D.    Dg Knee Complete 4 Views Left  01/10/2013  *RADIOLOGY REPORT*  Clinical Data: Fall, leg pain  LEFT KNEE - COMPLETE 4+ VIEW  Comparison: None.  Findings: No fracture or dislocation is seen.  Mild to moderate tricompartmental degenerative changes.  Moderate suprapatellar knee joint effusion.  Vascular calcifications pain  IMPRESSION: No fracture or dislocation is seen.  Mild to moderate degenerative changes.  Moderate suprapatellar knee joint effusion.   Original Report Authenticated By: Charline Bills, M.D.      PERTINENT LAB RESULTS: CBC:  Recent Labs  01/10/13 1502 01/11/13 0638  WBC 9.8 8.0  HGB 10.6* 10.0*  HCT 32.2* 30.3*  PLT 274 267   CMET CMP      Component Value Date/Time   NA 133* 01/11/2013 0638   K 4.6 01/11/2013 0638   CL 96 01/11/2013 0638   CO2 23 01/11/2013 0638   GLUCOSE 273* 01/11/2013 0638   BUN 42* 01/11/2013 0638   CREATININE 1.79* 01/11/2013 0638   CALCIUM 8.8 01/11/2013 0638   PROT 6.8 01/11/2013 0638   ALBUMIN 3.1* 01/11/2013 0638   AST 11 01/11/2013 0638   ALT 12 01/11/2013 0638   ALKPHOS 58 01/11/2013 0638   BILITOT 0.6 01/11/2013 1610  GFRNONAA 39* 01/11/2013 4782   GFRAA 45* 01/11/2013 9562    GFR Estimated Creatinine Clearance: 56.2 ml/min (by C-G formula based on Cr of 1.79). No results found for this basename: LIPASE, AMYLASE,  in the last 72 hours No results found for this basename: CKTOTAL, CKMB, CKMBINDEX, TROPONINI,  in the last 72 hours No components found with this basename: POCBNP,  No results found for this basename: DDIMER,  in the last 72 hours No results found for this basename: HGBA1C,  in the last 72 hours No results found for this basename: CHOL, HDL, LDLCALC, TRIG, CHOLHDL, LDLDIRECT,  in the last 72 hours No results found for this basename: TSH, T4TOTAL, FREET3, T3FREE, THYROIDAB,  in the last 72 hours No results found for this basename: VITAMINB12, FOLATE, FERRITIN, TIBC, IRON, RETICCTPCT,  in the last 72 hours Coags: No results found for this basename: PT, INR,  in the last 72 hours Microbiology: No results found for this or any previous visit (from the past 240 hour(s)).   BRIEF HOSPITAL COURSE:   Active Problems: Syncope  -from orthostatics-positive orthostatics vitals on admission  -  Amlodipine and Flomax have been stopped-BP remains stable off these meds -c/w Labetolol and Lasix  Bilateral knee pain  -Secondary to effusion, likely exacerbated by the recent fall.  -Patient seen by Blackhman of the orthopedic service, status post intra-articular steroid injection.  -Seen by PT-will need HHPT on discharge  Recurrent Enterobacter UTI  -spoke with Dr Daiva Eves on 2/26-he suggested a CT  Abdomen/Pelvis-to make sure no abscess -this was done-no abscess was found. Likely this is 2/2 immunocompromised state -otherwise patient has already completed a course of Oral Cipro  Diabetes mellitus type 2  -moderate control  -c/w Levemir and SSI   CKD Stage III  -Monitor Lytes periodically   Renal Transplant  -c/w Immuno-suppresives    TODAY-DAY OF DISCHARGE:  Subjective:   Orpah Cobb today has no headache,no chest abdominal pain,no new weakness tingling or numbness, feels much better wants to go home today.   Objective:   Blood pressure 142/64, pulse 66, temperature 98.2 F (36.8 C), temperature source Oral, resp. rate 18, height 6\' 1"  (1.854 m), weight 112.1 kg (247 lb 2.2 oz), SpO2 99.00%.  Intake/Output Summary (Last 24 hours) at 01/13/13 0845 Last data filed at 01/13/13 0500  Gross per 24 hour  Intake   1043 ml  Output   1750 ml  Net   -707 ml    Exam Awake Alert, Oriented *3, No new F.N deficits, Normal affect [place].AT,PERRAL Supple Neck,No JVD, No cervical lymphadenopathy appriciated.  Symmetrical Chest wall movement, Good air movement bilaterally, CTAB RRR,No Gallops,Rubs or new Murmurs, No Parasternal Heave +ve B.Sounds, Abd Soft, Non tender, No organomegaly appriciated, No rebound -guarding or rigidity. No Cyanosis, Clubbing or edema, No new Rash or bruise  DISCHARGE CONDITION: Stable  DISPOSITION:  HOME  DISCHARGE INSTRUCTIONS:    Activity:  As tolerated   Diet recommendation: Diabetic Diet Heart Healthy diet  Follow-up Information   Follow up with Kathryne Hitch, MD In 2 weeks.   Contact information:   [address] [place] [address] [phone]       Follow up with COLADONATO,JOSEPH A, MD. Schedule an appointment as soon as possible for a visit in 1 week.   Contact information:   [address] [facility] [place] [address] [phone]       Follow up with [name], MD. Schedule  an appointment as soon  as possible for a visit in 2 weeks.   Contact information:   [address] [place] [address] [phone]         Total Time spent on discharge equals 45 minutes.  SignedJeoffrey Massed 01/13/2013 8:45 AM

## 2013-01-13 NOTE — ED Provider Notes (Signed)
Medical screening examination/treatment/procedure(s) were performed by non-physician practitioner and as supervising physician I was immediately available for consultation/collaboration.  Toy Baker, MD 01/13/13 [phone]

## 2013-01-26 ENCOUNTER — Encounter: Payer: Self-pay | Admitting: Dietician

## 2013-01-26 ENCOUNTER — Encounter: Payer: Medicare HMO | Attending: Internal Medicine | Admitting: Dietician

## 2013-01-26 VITALS — Ht 73.0 in | Wt 253.2 lb

## 2013-01-26 DIAGNOSIS — Z713 Dietary counseling and surveillance: Secondary | ICD-10-CM | POA: Insufficient documentation

## 2013-01-26 DIAGNOSIS — E119 Type 2 diabetes mellitus without complications: Secondary | ICD-10-CM | POA: Insufficient documentation

## 2013-01-26 NOTE — Progress Notes (Signed)
Assessment:  Primary concerns today: Review the diet and try to eat better and lose more weight. Has as he describes, fallen off the wagon and been hospitalized a couple of times for glucose issues.  He has now permanently moved back to [place].  He is here with the support of his family.  Tells me that his kidney is doing good.  Has placed himself on a diet of "nothing white, and I only have 1-2 slices of bread a day, that is all."  It has been 3-4 years since I last saw him as a patient at [facility].     MEDICATIONS: Completed review of meds.   DIETARY INTAKE:   Avoided foods include shell fish.    24-hr recall:  B ( AM): 8-10:00 AM  Frosted Flakes  2 cups with almond milk 10 oz and piece of fruit banana whole regular.  OR 1 sausage small, 2 eggs scrambles,  slice of toast (whole wheat) tea, sweetened about 1 cup in 32 oz of water.  OR may have grits with the eggs. Snk ( AM): none sip on water (32 oz)  L ( PM): 2:00 1/2-1 whole sandwich (if no bread at breakfast) with [place] or balognoa (chicken), or chicken salad with mayo or mustard, drinks the tea with water Snk ( PM): Every once in a while a PB crackers, 2-3-4 (Lance crackers higher) D ( PM): 5-7:00 Spaghetti  About 1.5 cup pasta and sauce and chef's salad (lettuce, tomato, [place], eggs) 1 bowl prior to meal and 1/2  At the meal.Serving at 1-2 cups.  Drank his tea with water. Snk ( PM): none as a rule.  If cake is in the house will have a small slice       Diabetes Self-Management Education  Visit Number: First/Initial (1)  01/26/2013 [name], identified by name and date of birth, is a 62 y.o. male with Diabetes Type: Type 2 (type 2).  Other people present during visit:   (none)   ASSESSMENT  Patient Concerns:  Nutrition/Meal planning  Height 6\' 1"  (1.854 m), weight 253 lb 3.2 oz (114.851 kg). Body mass index is 33.41 kg/(m^2).  Lab Results: Hemoglobin A1C  Date Value Range Status  01/02/2013 7.4* <5.7 % Final      (NOTE)                                                                               According to the ADA Clinical Practice Recommendations for 2011, when     HbA1c is used as a screening test:      >=6.5%   Diagnostic of Diabetes Mellitus               (if abnormal result is confirmed)     5.7-6.4%   Increased risk of developing Diabetes Mellitus     References:Diagnosis and Classification of Diabetes Mellitus,Diabetes     Care,2011,34(Suppl 1):S62-S69 and Standards of Medical Care in             Diabetes - 2011,Diabetes Care,2011,34 (Suppl 1):S11-S61.     Family History  Problem Relation Age of Onset  . Diabetes Mother   . Diabetes Father   .  Kidney failure Brother   . Heart failure Sister    History  Substance Use Topics  . Smoking status: Former Games developer  . Smokeless tobacco: Never Used  . Alcohol Use: No    Support Systems:    Special Needs:  None  Prior DM Education:  Yes at [facility] Daily Foot Exams: Yes Patient Belief / Attitude about Diabetes:  Diabetes can be controlled  Individualized Plan for Diabetes Self-Management Training:  Request a simple review of the "diabetic diet."    Education Topics Reviewed with Patient Today:  Topic Points Discussed  Disease State Definition of diabetes, type 1 and 2, and the diagnosis of diabetes;Factors that contribute to the development of diabetes;Explored patient's options for treatment of their diabetes  Nutrition Management Role of diet in the treatment of diabetes and the relationship between the three main macronutrients and blood glucose level;Food label reading, portion sizes and measuring food.;Carbohydrate counting;Reviewed blood glucose goals for pre and post meals and how to evaluate the patients' food intake on their blood glucose level.;Meal timing in regards to the patients' current diabetes medication.  Physical Activity and Exercise Role of exercise on diabetes management, blood pressure control and cardiac health.   Medications Reviewed patients medication for diabetes, action, purpose, timing of dose and side effects.  Monitoring Taught/discussed recording of test results and interpretation of SMBG.;Daily foot exams  Acute Complications Discussed and identified patients' treatment of hyperglycemia.  Chronic Complications    Psychosocial Adjustment    Goal Setting Helped patient develop diabetes management plan for (enter comment)  Preconception Care (if applicable)      PATIENTS GOALS   Learning Objective(s):   Goal The patient agrees to:  Nutrition Follow meal plan discussed;Other (comment) and continue to lose weight to 240 pounds for now.  Physical Activity  Agrees to continue his daily walking regimen.  Medications    Monitoring    Problem Solving    Reducing Risk    Health Coping     Patient Self-Evaluation of Goals (Subsequent Visits)  Goal The patient rates self as meeting goals (% of time)  Nutrition  Agrees to try to limit his use of the concentrated sweets and sugars.  Agrees to moderate starch intake.  Physical Activity    Medications    Monitoring    Problem Solving    Reducing Risk    Health Coping  Desires to lose down to 240 lb and later to set a new goal.     PERSONALIZED PLAN / SUPPORT  Self-Management Support:   Adequate  Goal Evaluation:  Sometimes 25% ______________________________________________________________________  Outcomes  Expected Outcomes:  Demonstrated interest in learning. Expect positive outcomes  Self-Care Barriers:  None  Education material provided: Living well with diabetes         Non-starchy vegetable list        Yellow Card with meal/nutrient changes.  If problems or questions, patient to contact team via:  phone  Time in: 0945     Time out: 1045  Future DSME appointment: - 6 months   MAY,MARGARET 01/26/2013 11:08 AM

## 2013-01-27 ENCOUNTER — Ambulatory Visit (INDEPENDENT_AMBULATORY_CARE_PROVIDER_SITE_OTHER): Payer: Medicare HMO | Admitting: Internal Medicine

## 2013-01-27 ENCOUNTER — Encounter: Payer: Self-pay | Admitting: Internal Medicine

## 2013-01-27 VITALS — BP 140/82 | HR 83 | Temp 98.2°F | Ht 73.0 in | Wt 259.2 lb

## 2013-01-27 DIAGNOSIS — E119 Type 2 diabetes mellitus without complications: Secondary | ICD-10-CM

## 2013-01-27 MED ORDER — LIRAGLUTIDE 18 MG/3ML ~~LOC~~ SOLN: 1.2000 mg | Freq: Every day | SUBCUTANEOUS | Status: DC

## 2013-01-27 NOTE — Patient Instructions (Addendum)
Please return in 1 month with your sugar log. Start Victoza at 0.6 mg under skin daily with breakfast x 1 week, the increase to 1.2 mg daily.

## 2013-01-27 NOTE — Progress Notes (Signed)
Subjective:     Patient ID: [name], male   DOB: 22-Nov-1950, 62 y.o.   MRN: [id number]  HPI [name] is a 62 year old African American man returning for management of DM2, dx 1970's, insulin-dependent, uncontrolled, with complications (peripheral neuropathy, CKD with history of renal transplant in 2003, gastroparesis, and likely diabetic retinopathy, status post intraocular hemorrhage status post laser surgery).  Pt has been on insulin from diagnosis. He developed chronic kidney disease, and was on dialysis for about 3 years before having a kidney transplant 10 years ago: he now takes Prednisone 5, Cellcept, Cyclosporine. He sees Dr. Arrie Aran with nephrology.  Last hemoglobin A1c: Lab Results  Component Value Date   HGBA1C 7.4* 01/02/2013  prior to this 7.2%.  At last visit, he was telling me that he was on Lantus 20 units for several years (no other medication) - taking it in am. He had well controlled sugars, as I was able to see in his nicely-kept CBG log, however, this became very expensive for him- 90$ a bottle. During his past hospitalization, last month, he has been switched to insulin 70/30 - 10 units bid, that initially caused him low CBGs, then increased since he arrived home from the hospital, consistently in the 200s and even one value at 400. He was on a taper of Prednisone when he had these high values. At last visit, I changed him back to basal insulin, Levemir 20 units daily.  He had leg pain at our last visit, and he went to the emergency room to check on this. He was admitted and fluid was taken out from both of his knees, while steroids have been injected. His sugars greatly increased after the steroid injections. He feels that now the sugars are decreasing, however, looking in his log, they can vary between 100-300's. The higher sugars are right after the injections. He is on prednisone daily, but the low dose of 5 mg. Also, looking his log, the patient is apparently  injecting Levemir for correction for his morning or lunch time sugars, along with the higher dose that he takes at bedtime. He has been on 20 units of Levemir, however he felt that this is too much, and he decreased to 15 units. He was probably right in decreasing the dose, since there are instances where he dropped by 150 points between bedtime and fasting shakings.  He has gastroparesis (on Reglan), CKD, peripheral neuropathy (on Gabapentin - 300 mg bid), he has DR and had laser surgery for bleeding.   Review of Systems Constitutional: patient feels much better than at last visit when I saw him. No weight gain/loss, no fatigue, no subjective hyperthermia/hypothermia Eyes: no blurry vision, no xerophthalmia ENT: no sore throat, no nodules palpated in throat, no dysphagia/odynophagia, no hoarseness Cardiovascular: no CP/SOB/palpitations/leg swelling Respiratory: no cough/SOB Gastrointestinal: no N/V/D/C Musculoskeletal: no muscle/joint aches Skin: no rashes Neurological: no tremors/numbness/tingling/dizziness Psychiatric: no depression/anxiety  I reviewed pt's medications, allergies, PMH, social hx, family hx and no changes required.   Insurance prefers [employer] testing supplies. Objective:   Physical Exam BP 140/82  Pulse 83  Temp(Src) 98.2 F (36.8 C) (Oral)  Ht 6\' 1"  (1.854 m)  Wt 259 lb 4 oz (117.595 kg)  BMI 34.21 kg/m2  SpO2 98% Wt Readings from Last 3 Encounters:  01/27/13 259 lb 4 oz (117.595 kg)  01/26/13 253 lb 3.2 oz (114.851 kg)  01/10/13 247 lb 2.2 oz (112.1 kg)   Constitutional: overweight, in NAD Eyes: PERRLA, EOMI, no exophthalmos  ENT: moist mucous membranes, no thyromegaly, no cervical lymphadenopathy Cardiovascular: RRR, No MRG Respiratory: CTA B Gastrointestinal: abdomen soft, NT, ND, BS+ Musculoskeletal: no deformities, strength intact in all 4 Skin: moist, warm, no rashes Neurological: no tremor with outstretched hands, DTR normal in all 4    Assessment:      1. DM2, insulin-dependent, uncontrolled, with complications  - peripheral neuropathy - CKD with history of renal transplant in 2003 - gastroparesis - likely diabetic retinopathy, status post intraocular hemorrhage status post laser surgery    Plan:     Patient has a very long history of diabetes, and low insulin requirements, and the last hemoglobin A1c is only 7.4%. He does fairly well on his Levemir, and I believe that decreasing the dose from 20 to 15 units was the right thing to do. However, his sugars throughout the day are fairly high, in the higher 100s to 300s. This might be steroid effect, since his high sugars are highest right after the injections.  - the patient was injecting Levemir insulin as correction for his higher CBGs throughout the day, and I believe that he would be a little bit more help with his mealtime sugars. I advised him to keep the Levemir dose at 15, and not change the dose unless his sugars are consistently high or low in the morning. I will start Victoza, up to a dose of 1.2 mg daily. The patient does have a history of gastroparesis, and I do hope that he can tolerate this medication. This would help with weight loss, too. I gave him a sample Victoza pen. - I will see him back in a month with his log - no labs for today

## 2013-02-03 ENCOUNTER — Emergency Department (HOSPITAL_COMMUNITY): Payer: Medicare HMO

## 2013-02-03 ENCOUNTER — Inpatient Hospital Stay (HOSPITAL_COMMUNITY)
Admission: EM | Admit: 2013-02-03 | Discharge: 2013-02-07 | DRG: 872 | Disposition: A | Discharge status: Home Health | Payer: Medicare HMO | Attending: Internal Medicine | Admitting: Internal Medicine

## 2013-02-03 ENCOUNTER — Encounter (HOSPITAL_COMMUNITY): Payer: Self-pay | Admitting: *Deleted

## 2013-02-03 DIAGNOSIS — E1149 Type 2 diabetes mellitus with other diabetic neurological complication: Secondary | ICD-10-CM

## 2013-02-03 DIAGNOSIS — Z87891 Personal history of nicotine dependence: Secondary | ICD-10-CM

## 2013-02-03 DIAGNOSIS — Z79899 Other long term (current) drug therapy: Secondary | ICD-10-CM

## 2013-02-03 DIAGNOSIS — E162 Hypoglycemia, unspecified: Secondary | ICD-10-CM

## 2013-02-03 DIAGNOSIS — N189 Chronic kidney disease, unspecified: Secondary | ICD-10-CM

## 2013-02-03 DIAGNOSIS — R4781 Slurred speech: Secondary | ICD-10-CM

## 2013-02-03 DIAGNOSIS — R6510 Systemic inflammatory response syndrome (SIRS) of non-infectious origin without acute organ dysfunction: Secondary | ICD-10-CM

## 2013-02-03 DIAGNOSIS — R7881 Bacteremia: Secondary | ICD-10-CM

## 2013-02-03 DIAGNOSIS — Z794 Long term (current) use of insulin: Secondary | ICD-10-CM

## 2013-02-03 DIAGNOSIS — A419 Sepsis, unspecified organism: Secondary | ICD-10-CM | POA: Diagnosis present

## 2013-02-03 DIAGNOSIS — N289 Disorder of kidney and ureter, unspecified: Secondary | ICD-10-CM

## 2013-02-03 DIAGNOSIS — R112 Nausea with vomiting, unspecified: Secondary | ICD-10-CM

## 2013-02-03 DIAGNOSIS — Z940 Kidney transplant status: Secondary | ICD-10-CM

## 2013-02-03 DIAGNOSIS — L0201 Cutaneous abscess of face: Secondary | ICD-10-CM

## 2013-02-03 DIAGNOSIS — N390 Urinary tract infection, site not specified: Secondary | ICD-10-CM

## 2013-02-03 DIAGNOSIS — E785 Hyperlipidemia, unspecified: Secondary | ICD-10-CM | POA: Diagnosis present

## 2013-02-03 DIAGNOSIS — I129 Hypertensive chronic kidney disease with stage 1 through stage 4 chronic kidney disease, or unspecified chronic kidney disease: Secondary | ICD-10-CM | POA: Diagnosis present

## 2013-02-03 DIAGNOSIS — A4159 Other Gram-negative sepsis: Principal | ICD-10-CM | POA: Diagnosis present

## 2013-02-03 DIAGNOSIS — I10 Essential (primary) hypertension: Secondary | ICD-10-CM

## 2013-02-03 DIAGNOSIS — E872 Acidosis, unspecified: Secondary | ICD-10-CM

## 2013-02-03 DIAGNOSIS — G9340 Encephalopathy, unspecified: Secondary | ICD-10-CM

## 2013-02-03 DIAGNOSIS — N183 Chronic kidney disease, stage 3 unspecified: Secondary | ICD-10-CM | POA: Diagnosis present

## 2013-02-03 DIAGNOSIS — J209 Acute bronchitis, unspecified: Secondary | ICD-10-CM

## 2013-02-03 DIAGNOSIS — G4730 Sleep apnea, unspecified: Secondary | ICD-10-CM

## 2013-02-03 DIAGNOSIS — R509 Fever, unspecified: Secondary | ICD-10-CM

## 2013-02-03 DIAGNOSIS — I517 Cardiomegaly: Secondary | ICD-10-CM

## 2013-02-03 DIAGNOSIS — D638 Anemia in other chronic diseases classified elsewhere: Secondary | ICD-10-CM

## 2013-02-03 DIAGNOSIS — E871 Hypo-osmolality and hyponatremia: Secondary | ICD-10-CM

## 2013-02-03 DIAGNOSIS — N179 Acute kidney failure, unspecified: Secondary | ICD-10-CM

## 2013-02-03 DIAGNOSIS — E119 Type 2 diabetes mellitus without complications: Secondary | ICD-10-CM

## 2013-02-03 DIAGNOSIS — M25562 Pain in left knee: Secondary | ICD-10-CM

## 2013-02-03 DIAGNOSIS — I951 Orthostatic hypotension: Secondary | ICD-10-CM

## 2013-02-03 DIAGNOSIS — K3184 Gastroparesis: Secondary | ICD-10-CM | POA: Diagnosis present

## 2013-02-03 DIAGNOSIS — E1142 Type 2 diabetes mellitus with diabetic polyneuropathy: Secondary | ICD-10-CM | POA: Diagnosis present

## 2013-02-03 DIAGNOSIS — Z7689 Persons encountering health services in other specified circumstances: Secondary | ICD-10-CM

## 2013-02-03 LAB — COMPREHENSIVE METABOLIC PANEL
ALT: 6 U/L (ref 0–53)
Alkaline Phosphatase: 67 U/L (ref 39–117)
BUN: 49 mg/dL — ABNORMAL HIGH (ref 6–23)
CO2: 22 mEq/L (ref 19–32)
Chloride: 96 mEq/L (ref 96–112)
GFR calc Af Amer: 26 mL/min — ABNORMAL LOW (ref >90)
GFR calc non Af Amer: 23 mL/min — ABNORMAL LOW (ref >90)
Glucose, Bld: 132 mg/dL — ABNORMAL HIGH (ref 70–99)
Potassium: 4.8 mEq/L (ref 3.5–5.1)
Sodium: 132 mEq/L — ABNORMAL LOW (ref 135–145)
Total Bilirubin: 1 mg/dL (ref 0.3–1.2)

## 2013-02-03 LAB — URINALYSIS, ROUTINE W REFLEX MICROSCOPIC
Bilirubin Urine: NEGATIVE
Ketones, ur: NEGATIVE mg/dL
Nitrite: NEGATIVE
Specific Gravity, Urine: 1.017 (ref 1.005–1.030)
Urobilinogen, UA: 0.2 mg/dL (ref 0.0–1.0)

## 2013-02-03 LAB — CBC WITH DIFFERENTIAL/PLATELET
Eosinophils Absolute: 0 10*3/uL (ref 0.0–0.7)
Hemoglobin: 10.1 g/dL — ABNORMAL LOW (ref 13.0–17.0)
Lymphocytes Relative: 7 % — ABNORMAL LOW (ref 12–46)
Lymphs Abs: 0.8 10*3/uL (ref 0.7–4.0)
MCH: 27.4 pg (ref 26.0–34.0)
Monocytes Relative: 18 % — ABNORMAL HIGH (ref 3–12)
Neutro Abs: 7.8 10*3/uL — ABNORMAL HIGH (ref 1.7–7.7)
Neutrophils Relative %: 74 % (ref 43–77)
Platelets: 192 10*3/uL (ref 150–400)
RBC: 3.69 MIL/uL — ABNORMAL LOW (ref 4.22–5.81)
WBC: 10.4 10*3/uL (ref 4.0–10.5)

## 2013-02-03 LAB — POCT I-STAT TROPONIN I: Troponin i, poc: 0.01 ng/mL (ref 0.00–0.08)

## 2013-02-03 LAB — URINE MICROSCOPIC-ADD ON

## 2013-02-03 LAB — LIPASE, BLOOD: Lipase: 22 U/L (ref 11–59)

## 2013-02-03 MED ORDER — SODIUM CHLORIDE 0.9 % IV BOLUS (SEPSIS)
1000.0000 mL | Freq: Once | INTRAVENOUS | Status: AC
  Administered 2013-02-04: 1000 mL via INTRAVENOUS

## 2013-02-03 MED ORDER — ACETAMINOPHEN 650 MG RE SUPP
650.0000 mg | Freq: Once | RECTAL | Status: AC
  Administered 2013-02-04: 650 mg via RECTAL
  Filled 2013-02-03: qty 1

## 2013-02-03 MED ORDER — DEXTROSE 5 % IV SOLN
1.0000 g | Freq: Once | INTRAVENOUS | Status: AC
  Administered 2013-02-04: 1 g via INTRAVENOUS
  Filled 2013-02-03: qty 10

## 2013-02-03 NOTE — ED Notes (Signed)
EKG given to EDP, Kohut,MD. 

## 2013-02-03 NOTE — ED Provider Notes (Signed)
History     CSN: [id number]  Arrival date & time 02/03/13  2130   First MD Initiated Contact with Patient 02/03/13 2256      Chief Complaint  Patient presents with  . Altered Mental Status  . Emesis    (Consider location/radiation/quality/duration/timing/severity/associated sxs/prior treatment) HPI Hx per PT- AMS and emesis tonight, dec responsiveness and confused. Fever to 103 on arrival - PT denies any complaints now - some trouble urinating today and strong smelling urine- PCP [facility]. Hx of kidney transplant 10 years ago.  Symptoms MOD in severity.  Past Medical History  Diagnosis Date  . Diabetes mellitus   . Hypertension   . Kidney transplant as cause of abnormal reaction or later complication     In [name]-Dr. Colodonato-Dr. Lawerance Cruel is the Transplant dodctor at [facility]  . Hyperlipidemia   . Chronic kidney disease   . Neuromuscular disorder     Past Surgical History  Procedure Laterality Date  . Kidney transplant      Family History  Problem Relation Age of Onset  . Diabetes Mother   . Diabetes Father   . Kidney failure Brother   . Heart failure Sister     History  Substance Use Topics  . Smoking status: Former Games developer  . Smokeless tobacco: Never Used  . Alcohol Use: No      Review of Systems  Constitutional: Positive for fever.  HENT: Negative for neck pain and neck stiffness.   Eyes: Negative for pain and visual disturbance.  Respiratory: Negative for shortness of breath.   Cardiovascular: Negative for chest pain.  Gastrointestinal: Positive for vomiting. Negative for abdominal pain.  Genitourinary: Negative for dysuria.  Musculoskeletal: Negative for back pain.  Skin: Negative for rash.  Neurological: Negative for seizures and headaches.  All other systems reviewed and are negative.    Allergies  Iodine and Shellfish allergy  Home Medications   Current Outpatient Rx  Name  Route  Sig  Dispense  Refill  . acetaminophen (ARTHRITIS PAIN  RELIEF) 650 MG CR tablet   Oral   Take 650-1,300 mg by mouth 2 (two) times daily as needed (arthritis).          Marland Kitchen albuterol (PROVENTIL HFA;VENTOLIN HFA) 108 (90 BASE) MCG/ACT inhaler   Inhalation   Inhale 2 puffs into the lungs every 4 (four) hours as needed for wheezing.   1 Inhaler   0   . aspirin EC 81 MG tablet   Oral   Take 81 mg by mouth daily.         . capsaicin (ZOSTRIX) 0.025 % cream   Topical   Apply 1 application topically 4 (four) times daily - after meals and at bedtime.         . cycloSPORINE modified (NEORAL) 100 MG capsule   Oral   Take 100 mg by mouth 2 (two) times daily. In addition to 50mg  twice daily for total 150 mg twice daily.         . cycloSPORINE modified (NEORAL) 25 MG capsule   Oral   Take 50 mg by mouth 2 (two) times daily. In addition to 100mg  po twice daily for total dose of 150mg  twice daily.         . fexofenadine (ALLEGRA) 180 MG tablet   Oral   Take 180 mg by mouth daily as needed. For allergies         . fluticasone (FLONASE) 50 MCG/ACT nasal spray   Nasal   Place  1 spray into the nose daily as needed. Allergies or sinus congestion.         . furosemide (LASIX) 80 MG tablet   Oral   Take 80 mg by mouth daily.           Marland Kitchen gabapentin (NEURONTIN) 300 MG capsule   Oral   Take 1 capsule (300 mg total) by mouth 2 (two) times daily.   180 capsule   3   . insulin detemir (LEVEMIR) 100 UNIT/ML injection   Subcutaneous   Inject 15 Units into the skin at bedtime.         Marland Kitchen labetalol (NORMODYNE) 300 MG tablet   Oral   Take 0.5 tablets (150 mg total) by mouth 2 (two) times daily.         . Liraglutide (VICTOZA) 18 MG/3ML SOLN injection   Subcutaneous   Inject 0.2 mLs (1.2 mg total) into the skin daily.   6 mL   3   . metoCLOPramide (REGLAN) 10 MG tablet   Oral   Take 1 tablet (10 mg total) by mouth daily.   90 tablet   3   . mycophenolate (CELLCEPT) 250 MG capsule   Oral   Take 1,000 mg by mouth 2 (two)  times daily.         Marland Kitchen omeprazole (PRILOSEC) 20 MG capsule   Oral   Take 20 mg by mouth daily.           . pravastatin (PRAVACHOL) 80 MG tablet   Oral   Take 80 mg by mouth at bedtime.         . predniSONE (DELTASONE) 5 MG tablet   Oral   Take 1 tablet (5 mg total) by mouth daily. Resume the prednisone 5 mg after your finished the 50 mg taper.         . sodium bicarbonate 650 MG tablet   Oral   Take 1 tablet (650 mg total) by mouth 2 (two) times daily.   30 tablet   0     BP 119/44  Pulse 96  Temp(Src) 102.1 F (38.9 C) (Rectal)  Resp 29  SpO2 99%  Physical Exam  Constitutional: He is oriented to person, place, and time. He appears well-developed and well-nourished.  HENT:  Head: Normocephalic and atraumatic.  Eyes: EOM are normal. Pupils are equal, round, and reactive to light. No scleral icterus.  Neck: Neck supple.  Cardiovascular: Normal rate, regular rhythm and intact distal pulses.   Pulmonary/Chest: Effort normal and breath sounds normal. No stridor. No respiratory distress. He exhibits no tenderness.  Abdominal: Soft. Bowel sounds are normal. He exhibits no distension. There is no tenderness. There is no rebound and no guarding.  Musculoskeletal: Normal range of motion. He exhibits no edema.  Neurological: He is alert and oriented to person, place, and time. He displays normal reflexes. No cranial nerve deficit. He exhibits normal muscle tone. Coordination normal.  Skin: Skin is warm and dry. No rash noted.    ED Course  Procedures (including critical care time)  Results for orders placed during the hospital encounter of 02/03/13  CBC WITH DIFFERENTIAL      Result Value Range   WBC 10.4  4.0 - 10.5 K/uL   RBC 3.69 (*) 4.22 - 5.81 MIL/uL   Hemoglobin 10.1 (*) 13.0 - 17.0 g/dL   HCT 16.1 (*) 09.6 - 04.5 %   MCV 85.6  78.0 - 100.0 fL   MCH 27.4  26.0 -  34.0 pg   MCHC 32.0  30.0 - 36.0 g/dL   RDW 81.1  91.4 - 78.2 %   Platelets 192  150 - 400 K/uL    Neutrophils Relative 74  43 - 77 %   Neutro Abs 7.8 (*) 1.7 - 7.7 K/uL   Lymphocytes Relative 7 (*) 12 - 46 %   Lymphs Abs 0.8  0.7 - 4.0 K/uL   Monocytes Relative 18 (*) 3 - 12 %   Monocytes Absolute 1.9 (*) 0.1 - 1.0 K/uL   Eosinophils Relative 0  0 - 5 %   Eosinophils Absolute 0.0  0.0 - 0.7 K/uL   Basophils Relative 0  0 - 1 %   Basophils Absolute 0.0  0.0 - 0.1 K/uL  COMPREHENSIVE METABOLIC PANEL      Result Value Range   Sodium 132 (*) 135 - 145 mEq/L   Potassium 4.8  3.5 - 5.1 mEq/L   Chloride 96  96 - 112 mEq/L   CO2 22  19 - 32 mEq/L   Glucose, Bld 132 (*) 70 - 99 mg/dL   BUN 49 (*) 6 - 23 mg/dL   Creatinine, Ser 9.56 (*) 0.50 - 1.35 mg/dL   Calcium 8.7  8.4 - 21.3 mg/dL   Total Protein 7.3  6.0 - 8.3 g/dL   Albumin 3.3 (*) 3.5 - 5.2 g/dL   AST 12  0 - 37 U/L   ALT 6  0 - 53 U/L   Alkaline Phosphatase 67  39 - 117 U/L   Total Bilirubin 1.0  0.3 - 1.2 mg/dL   GFR calc non Af Amer 23 (*) >90 mL/min   GFR calc Af Amer 26 (*) >90 mL/min  LIPASE, BLOOD      Result Value Range   Lipase 22  11 - 59 U/L  URINALYSIS, ROUTINE W REFLEX MICROSCOPIC      Result Value Range   Color, Urine YELLOW  YELLOW   APPearance CLOUDY (*) CLEAR   Specific Gravity, Urine 1.017  1.005 - 1.030   pH 5.5  5.0 - 8.0   Glucose, UA NEGATIVE  NEGATIVE mg/dL   Hgb urine dipstick MODERATE (*) NEGATIVE   Bilirubin Urine NEGATIVE  NEGATIVE   Ketones, ur NEGATIVE  NEGATIVE mg/dL   Protein, ur NEGATIVE  NEGATIVE mg/dL   Urobilinogen, UA 0.2  0.0 - 1.0 mg/dL   Nitrite NEGATIVE  NEGATIVE   Leukocytes, UA MODERATE (*) NEGATIVE  LACTIC ACID, PLASMA      Result Value Range   Lactic Acid, Venous 0.9  0.5 - 2.2 mmol/L  URINE MICROSCOPIC-ADD ON      Result Value Range   WBC, UA TOO NUMEROUS TO COUNT  <3 WBC/hpf   RBC / HPF 0-2  <3 RBC/hpf   Bacteria, UA MANY (*) RARE  POCT I-STAT TROPONIN I      Result Value Range   Troponin i, poc 0.01  0.00 - 0.08 ng/mL   Comment 3               Dg Chest  Port 1 View  02/03/2013  *RADIOLOGY REPORT*  Clinical Data: Acute mental status changes.  Nausea and vomiting. Current history of hypertension.  PORTABLE CHEST - 1 VIEW   02/03/2013 2208 hours:  Comparison: Two-view chest x-ray 01/10/2013, 01/02/2013.  Findings: Cardiac silhouette moderately enlarged but stable. Thoracic aorta atherosclerotic, unchanged.  Hilar and mediastinal contours otherwise unremarkable.  Lungs clear.  Bronchovascular markings normal.  Pulmonary vascularity normal.  No pneumothorax. No pleural effusions.  IMPRESSION: Stable cardiomegaly.  No acute cardiopulmonary disease.   Original Report Authenticated By: Hulan Saas, M.D.     Date: 02/04/2013  Rate: 84  Rhythm: normal sinus rhythm  QRS Axis: left  Intervals: normal  ST/T Wave abnormalities: nonspecific ST changes  Conduction Disutrbances:none  Narrative Interpretation: NSR with multiple PVCs  Old EKG Reviewed: changes noted - prior ECG NSR no PVCs 01-10-13   IV rocephin, IVFs, MED consult - [facility] to admit   MDM  AMS/ fever and UTI  Lactate WNL, no hypotension  midly elevated crt h/o kidney transplant  IV ABx and MED admit        Sunnie Nielsen, MD 02/04/13 0211

## 2013-02-03 NOTE — ED Notes (Signed)
AVW:[id number]<XB> Expected date:<BR> Expected time:<BR> Means of arrival:<BR> Comments:<BR> EMS/N/V/IV Narcan-weakness/altered

## 2013-02-03 NOTE — ED Notes (Signed)
Pt to ER via EMS for complaints of decreased LOC and vomiting; pt reports that he has "felt bad" all day and has been sleeping most of the day; pt also reports that he has vomited several times today; denies diarrhea; EMS reports that upon arrival pt was slumped over in chair and unable to hold head up or complete sentences without falling asleep when they were talking to him; EMS administered 2 mg Narcan, of NS and 4 mg of Zofran and report pt is more alert and able to talk with them; pt arrives alert and able to follow commands; pt denies pain; states "I just don't feel well"

## 2013-02-04 DIAGNOSIS — R6510 Systemic inflammatory response syndrome (SIRS) of non-infectious origin without acute organ dysfunction: Secondary | ICD-10-CM

## 2013-02-04 DIAGNOSIS — N390 Urinary tract infection, site not specified: Secondary | ICD-10-CM

## 2013-02-04 DIAGNOSIS — N289 Disorder of kidney and ureter, unspecified: Secondary | ICD-10-CM

## 2013-02-04 DIAGNOSIS — N189 Chronic kidney disease, unspecified: Secondary | ICD-10-CM

## 2013-02-04 DIAGNOSIS — R509 Fever, unspecified: Secondary | ICD-10-CM

## 2013-02-04 DIAGNOSIS — E871 Hypo-osmolality and hyponatremia: Secondary | ICD-10-CM

## 2013-02-04 DIAGNOSIS — Z940 Kidney transplant status: Secondary | ICD-10-CM

## 2013-02-04 LAB — BASIC METABOLIC PANEL
Calcium: 8.4 mg/dL (ref 8.4–10.5)
Creatinine, Ser: 2.65 mg/dL — ABNORMAL HIGH (ref 0.50–1.35)
GFR calc non Af Amer: 24 mL/min — ABNORMAL LOW (ref >90)
Sodium: 135 mEq/L (ref 135–145)

## 2013-02-04 LAB — CBC
MCH: 27.5 pg (ref 26.0–34.0)
Platelets: 160 10*3/uL (ref 150–400)
RBC: 3.57 MIL/uL — ABNORMAL LOW (ref 4.22–5.81)

## 2013-02-04 LAB — GLUCOSE, CAPILLARY
Glucose-Capillary: 131 mg/dL — ABNORMAL HIGH (ref 70–99)
Glucose-Capillary: 136 mg/dL — ABNORMAL HIGH (ref 70–99)

## 2013-02-04 MED ORDER — SODIUM CHLORIDE 0.9 % IV SOLN
INTRAVENOUS | Status: DC
  Administered 2013-02-04 – 2013-02-06 (×4): via INTRAVENOUS

## 2013-02-04 MED ORDER — ENOXAPARIN SODIUM 30 MG/0.3ML ~~LOC~~ SOLN
30.0000 mg | SUBCUTANEOUS | Status: DC
  Administered 2013-02-04 – 2013-02-05 (×2): 30 mg via SUBCUTANEOUS
  Filled 2013-02-04 (×2): qty 0.3

## 2013-02-04 MED ORDER — ONDANSETRON HCL 4 MG PO TABS: 4.0000 mg | ORAL_TABLET | Freq: Four times a day (QID) | ORAL | Status: DC | PRN

## 2013-02-04 MED ORDER — METOCLOPRAMIDE HCL 10 MG PO TABS
10.0000 mg | ORAL_TABLET | Freq: Three times a day (TID) | ORAL | Status: DC
  Administered 2013-02-04 – 2013-02-07 (×10): 10 mg via ORAL
  Filled 2013-02-04 (×14): qty 1

## 2013-02-04 MED ORDER — INSULIN ASPART 100 UNIT/ML ~~LOC~~ SOLN: 0.0000 [IU] | Freq: Every day | SUBCUTANEOUS | Status: DC

## 2013-02-04 MED ORDER — CYCLOSPORINE MODIFIED (NEORAL) 25 MG PO CAPS: 50.0000 mg | ORAL_CAPSULE | Freq: Two times a day (BID) | ORAL | Status: DC

## 2013-02-04 MED ORDER — FLUTICASONE PROPIONATE 50 MCG/ACT NA SUSP
1.0000 | Freq: Every day | NASAL | Status: DC | PRN
  Filled 2013-02-04: qty 16

## 2013-02-04 MED ORDER — ONDANSETRON HCL 4 MG/2ML IJ SOLN: 4.0000 mg | Freq: Four times a day (QID) | INTRAMUSCULAR | Status: DC | PRN

## 2013-02-04 MED ORDER — PRAVASTATIN SODIUM 40 MG PO TABS
80.0000 mg | ORAL_TABLET | Freq: Every day | ORAL | Status: DC
  Administered 2013-02-04 – 2013-02-05 (×2): 80 mg via ORAL
  Filled 2013-02-04 (×3): qty 2

## 2013-02-04 MED ORDER — CYCLOSPORINE MODIFIED (NEORAL) 25 MG PO CAPS
150.0000 mg | ORAL_CAPSULE | Freq: Two times a day (BID) | ORAL | Status: DC
  Administered 2013-02-04 – 2013-02-07 (×7): 150 mg via ORAL
  Filled 2013-02-04 (×9): qty 6

## 2013-02-04 MED ORDER — SODIUM BICARBONATE 650 MG PO TABS
650.0000 mg | ORAL_TABLET | Freq: Two times a day (BID) | ORAL | Status: DC
  Administered 2013-02-04 – 2013-02-07 (×7): 650 mg via ORAL
  Filled 2013-02-04 (×8): qty 1

## 2013-02-04 MED ORDER — LORATADINE 10 MG PO TABS
10.0000 mg | ORAL_TABLET | Freq: Every day | ORAL | Status: DC
  Administered 2013-02-04 – 2013-02-07 (×4): 10 mg via ORAL
  Filled 2013-02-04 (×4): qty 1

## 2013-02-04 MED ORDER — ACETAMINOPHEN 325 MG PO TABS
650.0000 mg | ORAL_TABLET | Freq: Four times a day (QID) | ORAL | Status: DC | PRN
  Administered 2013-02-04 – 2013-02-06 (×3): 650 mg via ORAL
  Filled 2013-02-04 (×3): qty 2

## 2013-02-04 MED ORDER — ASPIRIN EC 81 MG PO TBEC
81.0000 mg | DELAYED_RELEASE_TABLET | Freq: Every day | ORAL | Status: DC
  Administered 2013-02-04 – 2013-02-07 (×4): 81 mg via ORAL
  Filled 2013-02-04 (×4): qty 1

## 2013-02-04 MED ORDER — SIMVASTATIN 40 MG PO TABS
40.0000 mg | ORAL_TABLET | Freq: Every day | ORAL | Status: DC
  Filled 2013-02-04: qty 1

## 2013-02-04 MED ORDER — LIRAGLUTIDE 18 MG/3ML ~~LOC~~ SOLN
1.2000 mg | Freq: Every day | SUBCUTANEOUS | Status: DC
  Administered 2013-02-06 – 2013-02-07 (×2): 1.2 mg via SUBCUTANEOUS

## 2013-02-04 MED ORDER — DEXTROSE 5 % IV SOLN: 1.0000 g | Freq: Every day | INTRAVENOUS | Status: DC

## 2013-02-04 MED ORDER — PREDNISONE 5 MG PO TABS
5.0000 mg | ORAL_TABLET | Freq: Every day | ORAL | Status: DC
  Administered 2013-02-04 – 2013-02-07 (×4): 5 mg via ORAL
  Filled 2013-02-04 (×4): qty 1

## 2013-02-04 MED ORDER — POLYETHYLENE GLYCOL 3350 17 G PO PACK
17.0000 g | PACK | Freq: Every day | ORAL | Status: DC | PRN
  Administered 2013-02-04: 17 g via ORAL
  Filled 2013-02-04: qty 1

## 2013-02-04 MED ORDER — ACETAMINOPHEN 650 MG RE SUPP: 650.0000 mg | Freq: Four times a day (QID) | RECTAL | Status: DC | PRN

## 2013-02-04 MED ORDER — MYCOPHENOLATE MOFETIL 250 MG PO CAPS
1000.0000 mg | ORAL_CAPSULE | Freq: Two times a day (BID) | ORAL | Status: DC
  Administered 2013-02-04 – 2013-02-07 (×7): 1000 mg via ORAL
  Filled 2013-02-04 (×8): qty 4

## 2013-02-04 MED ORDER — DEXTROSE 5 % IV SOLN
1.0000 g | INTRAVENOUS | Status: DC
  Administered 2013-02-04: 1 g via INTRAVENOUS
  Filled 2013-02-04 (×2): qty 10

## 2013-02-04 MED ORDER — INSULIN DETEMIR 100 UNIT/ML ~~LOC~~ SOLN
15.0000 [IU] | Freq: Every day | SUBCUTANEOUS | Status: DC
  Administered 2013-02-04 – 2013-02-05 (×2): 15 [IU] via SUBCUTANEOUS
  Filled 2013-02-04 (×2): qty 0.15

## 2013-02-04 MED ORDER — PANTOPRAZOLE SODIUM 40 MG PO TBEC
40.0000 mg | DELAYED_RELEASE_TABLET | Freq: Every day | ORAL | Status: DC
  Administered 2013-02-04 – 2013-02-07 (×4): 40 mg via ORAL
  Filled 2013-02-04 (×5): qty 1

## 2013-02-04 MED ORDER — CAPSAICIN 0.025 % EX CREA
1.0000 "application " | TOPICAL_CREAM | Freq: Three times a day (TID) | CUTANEOUS | Status: DC
  Administered 2013-02-04 – 2013-02-06 (×11): 1 via TOPICAL
  Filled 2013-02-04: qty 56.6

## 2013-02-04 MED ORDER — INSULIN ASPART 100 UNIT/ML ~~LOC~~ SOLN
0.0000 [IU] | Freq: Three times a day (TID) | SUBCUTANEOUS | Status: DC
  Administered 2013-02-04 – 2013-02-06 (×5): 2 [IU] via SUBCUTANEOUS
  Administered 2013-02-06: 3 [IU] via SUBCUTANEOUS

## 2013-02-04 MED ORDER — LABETALOL HCL 300 MG PO TABS
150.0000 mg | ORAL_TABLET | Freq: Two times a day (BID) | ORAL | Status: DC
  Administered 2013-02-04 – 2013-02-07 (×7): 150 mg via ORAL
  Filled 2013-02-04 (×8): qty 0.5

## 2013-02-04 MED ORDER — GABAPENTIN 300 MG PO CAPS
300.0000 mg | ORAL_CAPSULE | Freq: Two times a day (BID) | ORAL | Status: DC
  Administered 2013-02-04 – 2013-02-07 (×7): 300 mg via ORAL
  Filled 2013-02-04 (×9): qty 1

## 2013-02-04 MED ORDER — ALBUTEROL SULFATE HFA 108 (90 BASE) MCG/ACT IN AERS
2.0000 | INHALATION_SPRAY | RESPIRATORY_TRACT | Status: DC | PRN
  Filled 2013-02-04: qty 6.7

## 2013-02-04 NOTE — Progress Notes (Signed)
Patient seen and examined at bedside please see earlier admission note by Dr. Suanne Marker. Patient is clinically stable and reports feeling better. He denies nausea and vomiting, no fevers or chills this morning. This is the addendum to earlier admission note. I have reviewed blood work and vitals signs. CBC indicating that hemoglobin remaining stable, creatinine is trending down from 2.79 --> 2.65. Patient is currently being admitted for presumptive urinary tract infection. Urine culture is pending. Agree with continuing Rocephin empirically for now. Blood cultures still pending. Will obtain CBC and BMP in the morning.  Debbora Presto, MD  [facility] Pager [phone]  If 7PM-7AM, please contact night-coverage [email or web address] Password [email or web address]

## 2013-02-04 NOTE — Care Management Note (Signed)
    Page 1 of 1   02/04/2013     1:34:05 PM   CARE MANAGEMENT NOTE 02/04/2013  Patient:  [name], [name]   Account Number:  [id number]  Date Initiated:  02/04/2013  Documentation initiated by:  Lanier Clam  Subjective/Objective Assessment:   ADMITTED W/N/V,CONFUSION.UTI.READMIT-2/24-2/27/14-DM.     Action/Plan:   FROM HOME W/SPOUSE.   Anticipated DC Date:  02/09/2013   Anticipated DC Plan:  HOME/SELF CARE      DC Planning Services  CM consult      Choice offered to / List presented to:             Status of service:  In process, will continue to follow Medicare Important Message given?   (If response is "NO", the following Medicare IM given date fields will be blank) Date Medicare IM given:   Date Additional Medicare IM given:    Discharge Disposition:    Per UR Regulation:  Reviewed for med. necessity/level of care/duration of stay  If discussed at Long Length of Stay Meetings, dates discussed:    Comments:  02/04/13 [facility] RN,BSN NCM [phone] WILL CONFIRM W/[facility] KRISTEN(REP) IF ACTIVE W/[facility],AWAIT CALL BACK.

## 2013-02-04 NOTE — H&P (Signed)
[facility] History and Physical  [name] ZOX:[id number] DOB: [birth year] DOA: 02/03/2013  Referring physician: Dr. Dierdre Highman PCP: No primary provider on file.  Specialists:   Chief Complaint: Nausea vomiting and confusion  HPI: [name] is a 62 y.o. male with history of diabetes, CKD, status post renal transplant 10 years ago, hypertension and as listed below who presents with complaints of nausea and vomiting x 1day. He reports that his sister noted he was'delirious' and so are called EMS. Patient denies dysuria, cough, fevers, diarrhea, melena and no hematochezia. He was seen in the ED and a urinalysis was consistent with a UTI, chest x-ray was negative for acute findings, his creatinine was 2.79 (last 1.79 January 11 2013). At the time of my interview in the ED patient alert, oriented and able to give a history. He is admitted for further evaluation and management.   Review of Systems: The patient denies fever, weight loss,, vision loss, decreased hearing, hoarseness, chest pain, syncope, dyspnea on exertion, peripheral edema, balance deficits, hemoptysis, abdominal pain, melena, hematochezia, severe indigestion/heartburn, hematuria, incontinence, muscle weakness, suspicious skin lesions, transient blindness, difficulty walking, depression, unusual weight change, abnormal bleeding   Past Medical History  Diagnosis Date  . Diabetes mellitus   . Hypertension   . Kidney transplant as cause of abnormal reaction or later complication     In [name]-Dr. Colodonato-Dr. Lawerance Cruel is the Transplant dodctor at [facility]  . Hyperlipidemia   . Chronic kidney disease   . Neuromuscular disorder    Past Surgical History  Procedure Laterality Date  . Kidney transplant     Social History:  reports that he has quit smoking. He has never used smokeless tobacco. He reports that he does not drink alcohol or use illicit drugs.  where does patient live--home Can patient participate in  ADLs-yes  Allergies  Allergen Reactions  . Iodine Anaphylaxis    Iv dye  . Shellfish Allergy Anaphylaxis and Swelling    Has throat swelling, tongue swelling, and entire body swells up.     Family History  Problem Relation Age of Onset  . Diabetes Mother   . Diabetes Father   . Kidney failure Brother   . Heart failure Sister     Prior to Admission medications   Medication Sig Start Date End Date Taking? Authorizing Provider  acetaminophen (ARTHRITIS PAIN RELIEF) 650 MG CR tablet Take 650-1,300 mg by mouth 2 (two) times daily as needed (arthritis).    Yes Historical Provider, MD  albuterol (PROVENTIL HFA;VENTOLIN HFA) 108 (90 BASE) MCG/ACT inhaler Inhale 2 puffs into the lungs every 4 (four) hours as needed for wheezing. 12/04/12  Yes Shanker Levora Dredge, MD  aspirin EC 81 MG tablet Take 81 mg by mouth daily.   Yes Historical Provider, MD  capsaicin (ZOSTRIX) 0.025 % cream Apply 1 application topically 4 (four) times daily - after meals and at bedtime.   Yes Historical Provider, MD  cycloSPORINE modified (NEORAL) 100 MG capsule Take 100 mg by mouth 2 (two) times daily. In addition to 50mg  twice daily for total 150 mg twice daily.   Yes Historical Provider, MD  cycloSPORINE modified (NEORAL) 25 MG capsule Take 50 mg by mouth 2 (two) times daily. In addition to 100mg  po twice daily for total dose of 150mg  twice daily.   Yes Historical Provider, MD  fexofenadine (ALLEGRA) 180 MG tablet Take 180 mg by mouth daily as needed. For allergies   Yes Historical Provider, MD  fluticasone (FLONASE) 50  MCG/ACT nasal spray Place 1 spray into the nose daily as needed. Allergies or sinus congestion.   Yes Historical Provider, MD  furosemide (LASIX) 80 MG tablet Take 80 mg by mouth daily.     Yes Historical Provider, MD  gabapentin (NEURONTIN) 300 MG capsule Take 1 capsule (300 mg total) by mouth 2 (two) times daily. 01/10/13  Yes [name], MD  insulin detemir (LEVEMIR) 100 UNIT/ML injection Inject 15  Units into the skin at bedtime. 01/10/13  Yes [name], MD  labetalol (NORMODYNE) 300 MG tablet Take 0.5 tablets (150 mg total) by mouth 2 (two) times daily. 01/05/13  Yes Tora Kindred York, PA-C  Liraglutide (VICTOZA) 18 MG/3ML SOLN injection Inject 0.2 mLs (1.2 mg total) into the skin daily. 01/27/13  Yes [name], MD  metoCLOPramide (REGLAN) 10 MG tablet Take 1 tablet (10 mg total) by mouth daily. 01/10/13  Yes [name], MD  mycophenolate (CELLCEPT) 250 MG capsule Take 1,000 mg by mouth 2 (two) times daily.   Yes Historical Provider, MD  omeprazole (PRILOSEC) 20 MG capsule Take 20 mg by mouth daily.     Yes Historical Provider, MD  pravastatin (PRAVACHOL) 80 MG tablet Take 80 mg by mouth at bedtime.   Yes Historical Provider, MD  predniSONE (DELTASONE) 5 MG tablet Take 1 tablet (5 mg total) by mouth daily. Resume the prednisone 5 mg after your finished the 50 mg taper. 01/05/13  Yes Marianne L York, PA-C  sodium bicarbonate 650 MG tablet Take 1 tablet (650 mg total) by mouth 2 (two) times daily. 12/04/12   Shanker Levora Dredge, MD   Physical Exam: Filed Vitals:   02/04/13 0048 02/04/13 0100 02/04/13 0130 02/04/13 0200  BP:  150/70 143/49 141/61  Pulse:  53 49 94  Temp: 102.3 F (39.1 C)     TempSrc: Oral     Resp:  24 24 24   SpO2:  99% 97% 99%   Constitutional: Vital signs reviewed.  Patient is a well-developed and well-nourished  in no acute distress and cooperative with exam. Alert and oriented x3.  Head: Normocephalic and atraumatic Mouth: no erythema or exudates, slightly dry MM Eyes: PERRL, EOMI, conjunctivae normal, No scleral icterus.  Neck: Supple, Trachea midline normal ROM, No JVD, mass, thyromegaly, or carotid bruit present.  Cardiovascular: RRR, S1 normal, S2 normal, no MRG, pulses symmetric and intact bilaterally Pulmonary/Chest: CTAB, no wheezes, rales, or rhonchi Abdominal: Soft. Non-tender, non-distended, bowel sounds are normal, no masses, organomegaly, or  guarding present.  GU: no CVA tenderness Extremities: No cyanosis and no edema  Neurological: A&O x3, Strength is normal and symmetric bilaterally, cranial nerve II-XII are grossly intact, no focal motor deficit, sensory intact to light touch bilaterally.  Skin: Warm, dry and intact. No rash, cyanosis, or clubbing.  Psychiatric: Normal mood and affect. speech and behavior is normal.   Labs on Admission:  Basic Metabolic Panel:  Recent Labs Lab 02/03/13 2238  NA 132*  K 4.8  CL 96  CO2 22  GLUCOSE 132*  BUN 49*  CREATININE 2.79*  CALCIUM 8.7   Liver Function Tests:  Recent Labs Lab 02/03/13 2238  AST 12  ALT 6  ALKPHOS 67  BILITOT 1.0  PROT 7.3  ALBUMIN 3.3*    Recent Labs Lab 02/03/13 2238  LIPASE 22   No results found for this basename: AMMONIA,  in the last 168 hours CBC:  Recent Labs Lab 02/03/13 2238  WBC 10.4  NEUTROABS 7.8*  HGB 10.1*  HCT  31.6*  MCV 85.6  PLT 192   Cardiac Enzymes: No results found for this basename: CKTOTAL, CKMB, CKMBINDEX, TROPONINI,  in the last 168 hours  BNP (last 3 results)  Recent Labs  12/01/12 0943  PROBNP 4046.0*   CBG: No results found for this basename: GLUCAP,  in the last 168 hours  Radiological Exams on Admission: Dg Chest Port 1 View  02/03/2013  *RADIOLOGY REPORT*  Clinical Data: Acute mental status changes.  Nausea and vomiting. Current history of hypertension.  PORTABLE CHEST - 1 VIEW   02/03/2013 2208 hours:  Comparison: Two-view chest x-ray 01/10/2013, 01/02/2013.  Findings: Cardiac silhouette moderately enlarged but stable. Thoracic aorta atherosclerotic, unchanged.  Hilar and mediastinal contours otherwise unremarkable.  Lungs clear.  Bronchovascular markings normal.  Pulmonary vascularity normal.  No pneumothorax. No pleural effusions.  IMPRESSION: Stable cardiomegaly.  No acute cardiopulmonary disease.   Original Report Authenticated By: Hulan Saas, M.D.      Assessment/Plan Active  Problems:  UTI (urinary tract infection) -As discussed above, obtain blood and urine cultures -Empiric antibiotics with Rocephin and follow.   SIRS (systemic inflammatory response syndrome) -Secondary to above, cultures and antibiotics as above -His lactic acid level is within normal limits Hyponatremia -Likely secondary to volume depletion-had been vomiting and was on Lasix -Hydrate follow and recheck Acute on chronic renal insufficiency -As discussed in HPI creatinine 2.79, from 1.79 on 2/25 -Likely precipitated by volume depletion and UTI -Hydrate, treat UTI as above and follow. -He is followed by Dr. Addison Naegeli consult if not improving HYPERTENSION, UNSPECIFIED -Continue labetalol, monitor and further treat as appropriate History of renal transplant -Continue outpatient medications  -He is followed by Dr. Abel Presto  Code Status: Full code  Family Communication: pt alone at bedside Disposition Plan:  Admit to inpatient  Time spent:>64mins  Kela Millin [facility] Pager [phone]  If 7PM-7AM, please contact night-coverage [email or web address] Password [name] - [place] 02/04/2013, 2:49 AM

## 2013-02-05 DIAGNOSIS — J209 Acute bronchitis, unspecified: Secondary | ICD-10-CM

## 2013-02-05 DIAGNOSIS — G9340 Encephalopathy, unspecified: Secondary | ICD-10-CM

## 2013-02-05 DIAGNOSIS — D638 Anemia in other chronic diseases classified elsewhere: Secondary | ICD-10-CM

## 2013-02-05 DIAGNOSIS — L0201 Cutaneous abscess of face: Secondary | ICD-10-CM

## 2013-02-05 HISTORY — DX: Anemia in other chronic diseases classified elsewhere: D63.8

## 2013-02-05 LAB — URINE CULTURE

## 2013-02-05 LAB — BASIC METABOLIC PANEL
BUN: 47 mg/dL — ABNORMAL HIGH (ref 6–23)
Calcium: 7.9 mg/dL — ABNORMAL LOW (ref 8.4–10.5)
GFR calc Af Amer: 31 mL/min — ABNORMAL LOW (ref >90)
GFR calc non Af Amer: 27 mL/min — ABNORMAL LOW (ref >90)
Glucose, Bld: 110 mg/dL — ABNORMAL HIGH (ref 70–99)
Potassium: 4.2 mEq/L (ref 3.5–5.1)

## 2013-02-05 LAB — CBC
HCT: 28.7 % — ABNORMAL LOW (ref 39.0–52.0)
MCH: 27.3 pg (ref 26.0–34.0)
MCHC: 32.1 g/dL (ref 30.0–36.0)
RDW: 14.2 % (ref 11.5–15.5)

## 2013-02-05 LAB — GLUCOSE, CAPILLARY: Glucose-Capillary: 91 mg/dL (ref 70–99)

## 2013-02-05 MED ORDER — PIPERACILLIN-TAZOBACTAM 3.375 G IVPB
3.3750 g | Freq: Three times a day (TID) | INTRAVENOUS | Status: DC
  Administered 2013-02-05 – 2013-02-07 (×6): 3.375 g via INTRAVENOUS
  Filled 2013-02-05 (×9): qty 50

## 2013-02-05 MED ORDER — ENOXAPARIN SODIUM 40 MG/0.4ML ~~LOC~~ SOLN
40.0000 mg | SUBCUTANEOUS | Status: DC
  Administered 2013-02-06 – 2013-02-07 (×2): 40 mg via SUBCUTANEOUS
  Filled 2013-02-05 (×2): qty 0.4

## 2013-02-05 NOTE — Progress Notes (Addendum)
Patient ID: [name], male   DOB: 1951/04/13, 62 y.o.   MRN: [id number]  [facility] PROGRESS NOTE  [name] WUJ:[id number] DOB: 1951/01/14 DOA: 02/03/2013 PCP: No primary provider on file.  Brief narrative: Pt is 62 y.o. male with history of diabetes, CKD stage III, status post renal transplant 10 years ago, hypertension, who presented to [facility] ED with main concern of progressive nausea and non bloody vomiting that initially started one day prior to admission. Pt denied fevers, chills, or other associated symptoms. He reports urinary urgency and sensation of incomplete voiding but is unsure of how long this has been present.   In ED, pt persistently nauseated and with vomiting, UA suggestive of UTI and BMP with slightly worsening renal failure. [facility] asked to admit for further evaluation and management.   Principal Problem:   Nausea and vomiting - likely secondary to UTI in pt with immunocompromised status  - now clinically improving, continue to advance diet as pt able to tolerate - supportive care with antiemetics, continue ABX Active Problems:   RENAL FAILURE, ACUTE - creatinine is trending down - monitor renal function closely - IVF, gentle hydration    SIRS (systemic inflammatory response syndrome) - likely secondary to UTI, in pt with immuno compromised status - will change ABX to Zosyn as pt still febrile and tachycardic - urine and blood culture final report pending    UTI (urinary tract infection) - urine culture pending - change Rocephin to  Zosyn given immunocompromised state  - follow urine output    HYPERTENSION, UNSPECIFIED - reasonable inpatient control - will continue Labetalol and will monitor vitals per protocol - readjust the regimen as indicated   Hyponatremia - chronic in etiology, stable and at pt's baseline - BMP in AM   Anemia of chronic disease - Hg and Hct stable and at pt's baseline - CBC in AM   DM 2, uncontrolled with diabetic  neuropathy, gastroparesis  - reasonable inpatient control - last A1C 12/2012 was 7.4, no need for repeat A1C at this time  - continue Insulin detemir, victoza, and SSI - continue Reglan and Neurontin    History of renal transplant - ~ 10 years ago - continue Cyclosporine, Mycophenolate, and prednisone   Consultants:  None  Procedures/Studies: Dg Chest Port 1 View 02/03/2013 --> Stable cardiomegaly.  No acute cardiopulmonary disease.    Antibiotics:  Rocephin 03/21 --> 03/22  Zosyn 03/22 -->  Code Status: Full Family Communication: Pt at bedside Disposition Plan: Home when medically stable  HPI/Subjective: No events overnight. Pt denies nausea or vomiting, tolerating current diet well. Denies chest pain or shortness of breath.   Objective: Filed Vitals:   02/04/13 1334 02/04/13 1357 02/04/13 2014 02/05/13 0520  BP: 135/56 158/112 153/54 137/54  Pulse: 106 102 51 61  Temp: 99.2 F (37.3 C) 97.2 F (36.2 C) 99.5 F (37.5 C) 97.8 F (36.6 C)  TempSrc: Oral Oral Oral Oral  Resp: 22 20 22 20   Height:      Weight:      SpO2: 96% 100% 100% 98%    Intake/Output Summary (Last 24 hours) at 02/05/13 1001 Last data filed at 02/05/13 0700  Gross per 24 hour  Intake   2215 ml  Output   1850 ml  Net    365 ml    Exam:   General:  Pt is alert, follows commands appropriately, not in acute distress  Cardiovascular: Regular rhythm, tachycardic, S1/S2, no murmurs, no rubs, no gallops  Respiratory: Clear to auscultation bilaterally, no wheezing, no crackles, no rhonchi  Abdomen: Soft, non tender, non distended, bowel sounds present, no guarding  Extremities: No edema, pulses DP and PT palpable bilaterally  Neuro: Grossly nonfocal  Data Reviewed: Basic Metabolic Panel:  Recent Labs Lab 02/03/13 2238 02/04/13 0520 02/05/13 0419  NA 132* 135 132*  K 4.8 4.7 4.2  CL 96 100 99  CO2 22 21 19   GLUCOSE 132* 144* 110*  BUN 49* 48* 47*  CREATININE 2.79* 2.65* 2.42*   CALCIUM 8.7 8.4 7.9*   Liver Function Tests:  Recent Labs Lab 02/03/13 2238  AST 12  ALT 6  ALKPHOS 67  BILITOT 1.0  PROT 7.3  ALBUMIN 3.3*    Recent Labs Lab 02/03/13 2238  LIPASE 22   CBC:  Recent Labs Lab 02/03/13 2238 02/04/13 0520 02/05/13 0419  WBC 10.4 8.6 9.2  NEUTROABS 7.8*  --   --   HGB 10.1* 9.8* 9.2*  HCT 31.6* 30.7* 28.7*  MCV 85.6 86.0 85.2  PLT 192 160 167   CBG:  Recent Labs Lab 02/04/13 0732 02/04/13 1200 02/04/13 1636 02/04/13 2012 02/05/13 0755  GLUCAP 128* 131* 136* 141* 77    Recent Results (from the past 240 hour(s))  URINE CULTURE     Status: None   Collection Time    02/03/13 10:54 PM      Result Value Range Status   Specimen Description URINE, CLEAN CATCH   Final   Special Requests NONE   Final   Culture  Setup Time 02/04/2013 04:36   Final   Colony Count >=100,000 COLONIES/ML   Final   Culture GRAM NEGATIVE RODS   Final   Report Status PENDING   Incomplete  CULTURE, BLOOD (ROUTINE X 2)     Status: None   Collection Time    02/04/13  5:20 AM      Result Value Range Status   Specimen Description BLOOD RIGHT WRIST   Final   Special Requests BOTTLES DRAWN AEROBIC ONLY 2CC   Final   Culture  Setup Time 02/04/2013 09:12   Final   Culture     Final   Value:        BLOOD CULTURE RECEIVED NO GROWTH TO DATE CULTURE WILL BE HELD FOR 5 DAYS BEFORE ISSUING A FINAL NEGATIVE REPORT   Report Status PENDING   Incomplete  CULTURE, BLOOD (ROUTINE X 2)     Status: None   Collection Time    02/04/13  5:20 AM      Result Value Range Status   Specimen Description BLOOD RIGHT ARM   Final   Special Requests BOTTLES DRAWN AEROBIC ONLY 3CC   Final   Culture  Setup Time 02/04/2013 09:12   Final   Culture     Final   Value:        BLOOD CULTURE RECEIVED NO GROWTH TO DATE CULTURE WILL BE HELD FOR 5 DAYS BEFORE ISSUING A FINAL NEGATIVE REPORT   Report Status PENDING   Incomplete     Scheduled Meds: . aspirin EC  81 mg Oral Daily  .  capsaicin  1 application Topical TID PC & HS  . cefTRIAXone   IV  1 g Intravenous Q24H  . cycloSPORINE modified  150 mg Oral BID  . enoxaparin injection  30 mg Subcutaneous Q24H  . gabapentin  300 mg Oral BID  . insulin aspart  0-15 Units Subcutaneous TID WC  . insulin aspart  0-5 Units  Subcutaneous QHS  . insulin detemir  15 Units Subcutaneous QHS  . labetalol  150 mg Oral BID  . Liraglutide  1.2 mg Subcutaneous Daily  . loratadine  10 mg Oral Daily  . metoCLOPramide  10 mg Oral TID AC  . mycophenolate  1,000 mg Oral BID  . pantoprazole  40 mg Oral Daily  . pravastatin  80 mg Oral q1800  . predniSONE  5 mg Oral Daily  . sodium bicarbonate  650 mg Oral BID   Continuous Infusions: . sodium chloride 75 mL/hr at 02/05/13 0724     Debbora Presto, MD  [facility] Pager [phone]  If 7PM-7AM, please contact night-coverage [email or web address] Password [email or web address] 02/05/2013, 10:01 AM   LOS: 2 days

## 2013-02-05 NOTE — Progress Notes (Signed)
ANTIBIOTIC CONSULT NOTE - INITIAL  Pharmacy Consult for Zosyn Indication: UTI  Allergies  Allergen Reactions  . Iodine Anaphylaxis    Iv dye  . Shellfish Allergy Anaphylaxis and Swelling    Has throat swelling, tongue swelling, and entire body swells up.     Patient Measurements: Height: 6\' 1"  (185.4 cm) Weight: 256 lb 2.8 oz (116.2 kg) IBW/kg (Calculated) : 79.9  Vital Signs: Temp: 97.8 F (36.6 C) (03/22 0520) Temp src: Oral (03/22 0520) BP: 137/54 mmHg (03/22 0520) Pulse Rate: 61 (03/22 0520) Intake/Output from previous day: 03/21 0701 - 03/22 0700 In: 2215 [P.O.:340; I.V.:1875] Out: 1850 [Urine:1850] Intake/Output from this shift: Total I/O In: 240 [P.O.:240] Out: -   Labs:  Recent Labs  02/03/13 2238 02/04/13 0520 02/05/13 0419  WBC 10.4 8.6 9.2  HGB 10.1* 9.8* 9.2*  PLT 192 160 167  CREATININE 2.79* 2.65* 2.42*   Estimated Creatinine Clearance: 41.7 ml/min (by C-G formula based on Cr of 2.42). No results found for this basename: VANCOTROUGH, VANCOPEAK, VANCORANDOM, GENTTROUGH, GENTPEAK, GENTRANDOM, TOBRATROUGH, TOBRAPEAK, TOBRARND, AMIKACINPEAK, AMIKACINTROU, AMIKACIN,  in the last 72 hours   Microbiology: Recent Results (from the past 720 hour(s))  URINE CULTURE     Status: None   Collection Time    02/03/13 10:54 PM      Result Value Range Status   Specimen Description URINE, CLEAN CATCH   Final   Special Requests NONE   Final   Culture  Setup Time 02/04/2013 04:36   Final   Colony Count >=100,000 COLONIES/ML   Final   Culture GRAM NEGATIVE RODS   Final   Report Status PENDING   Incomplete  CULTURE, BLOOD (ROUTINE X 2)     Status: None   Collection Time    02/04/13  5:20 AM      Result Value Range Status   Specimen Description BLOOD RIGHT WRIST   Final   Special Requests BOTTLES DRAWN AEROBIC ONLY 2CC   Final   Culture  Setup Time 02/04/2013 09:12   Final   Culture     Final   Value:        BLOOD CULTURE RECEIVED NO GROWTH TO DATE CULTURE  WILL BE HELD FOR 5 DAYS BEFORE ISSUING A FINAL NEGATIVE REPORT   Report Status PENDING   Incomplete  CULTURE, BLOOD (ROUTINE X 2)     Status: None   Collection Time    02/04/13  5:20 AM      Result Value Range Status   Specimen Description BLOOD RIGHT ARM   Final   Special Requests BOTTLES DRAWN AEROBIC ONLY 3CC   Final   Culture  Setup Time 02/04/2013 09:12   Final   Culture     Final   Value:        BLOOD CULTURE RECEIVED NO GROWTH TO DATE CULTURE WILL BE HELD FOR 5 DAYS BEFORE ISSUING A FINAL NEGATIVE REPORT   Report Status PENDING   Incomplete    Medical History: Past Medical History  Diagnosis Date  . Diabetes mellitus   . Hypertension   . Kidney transplant as cause of abnormal reaction or later complication     In [name]-Dr. Colodonato-Dr. Lawerance Cruel is the Transplant dodctor at [facility]  . Hyperlipidemia   . Chronic kidney disease   . Neuromuscular disorder     Assessment: 42 YOM with PMH significant for DM, CKD stage III, s/p renal transplant 10 years ago presented to ED with N/V found to have UTI during  admission.  Urine culture growing GNR.  Patient was started on ceftriaxone 3/21 but switching to Zosyn today given immunocompromised status.  Pt presented with acute on chronic renal insufficiency, SCr slowly improving since admission.  Estimated CrCl>20 ml/min so will continue with extended infusion Zosyn dosing.  Pt is afebrile and WBC are WNL.  Goal of Therapy:  dosing adjusted per renal clearance  Plan:  Zosyn 3.375g IV q8h (4 hour infusion time). F/u culture results and sensitivities.  Clance Boll 02/05/2013,11:57 AM

## 2013-02-06 LAB — CBC
HCT: 26.8 % — ABNORMAL LOW (ref 39.0–52.0)
Hemoglobin: 8.6 g/dL — ABNORMAL LOW (ref 13.0–17.0)
MCH: 27.3 pg (ref 26.0–34.0)
MCHC: 32.1 g/dL (ref 30.0–36.0)
MCV: 85.1 fL (ref 78.0–100.0)
RBC: 3.15 MIL/uL — ABNORMAL LOW (ref 4.22–5.81)

## 2013-02-06 LAB — BASIC METABOLIC PANEL
BUN: 48 mg/dL — ABNORMAL HIGH (ref 6–23)
CO2: 21 mEq/L (ref 19–32)
Calcium: 8.2 mg/dL — ABNORMAL LOW (ref 8.4–10.5)
Creatinine, Ser: 2.33 mg/dL — ABNORMAL HIGH (ref 0.50–1.35)
GFR calc non Af Amer: 28 mL/min — ABNORMAL LOW (ref >90)
Glucose, Bld: 72 mg/dL (ref 70–99)
Sodium: 138 mEq/L (ref 135–145)

## 2013-02-06 LAB — GLUCOSE, CAPILLARY
Glucose-Capillary: 82 mg/dL (ref 70–99)
Glucose-Capillary: 124 mg/dL — ABNORMAL HIGH (ref 70–99)

## 2013-02-06 MED ORDER — NAPROXEN 500 MG PO TABS
500.0000 mg | ORAL_TABLET | Freq: Two times a day (BID) | ORAL | Status: DC | PRN
  Administered 2013-02-06: 500 mg via ORAL
  Filled 2013-02-06 (×2): qty 1

## 2013-02-06 MED ORDER — INSULIN DETEMIR 100 UNIT/ML ~~LOC~~ SOLN
10.0000 [IU] | Freq: Every day | SUBCUTANEOUS | Status: DC
  Administered 2013-02-06: 10 [IU] via SUBCUTANEOUS
  Filled 2013-02-06 (×2): qty 0.1

## 2013-02-06 MED ORDER — PRAVASTATIN SODIUM 20 MG PO TABS
20.0000 mg | ORAL_TABLET | Freq: Every day | ORAL | Status: DC
  Administered 2013-02-06: 20 mg via ORAL
  Filled 2013-02-06 (×2): qty 1

## 2013-02-06 MED ORDER — ZOLPIDEM TARTRATE 5 MG PO TABS
5.0000 mg | ORAL_TABLET | Freq: Once | ORAL | Status: AC
  Administered 2013-02-06: 5 mg via ORAL
  Filled 2013-02-06: qty 1

## 2013-02-06 NOTE — Progress Notes (Signed)
Patient ID: [name], male   DOB: 1951-04-06, 62 y.o.   MRN: [id number]  [facility] PROGRESS NOTE  [name] WUJ:[id number] DOB: 1951-10-16 DOA: 02/03/2013 PCP: No primary provider on file.  Brief narrative:  Pt is 62 y.o. male with history of diabetes, CKD stage III, status post renal transplant 10 years ago, hypertension, who presented to [facility] ED with main concern of progressive nausea and non bloody vomiting that initially started one day prior to admission. Pt denied fevers, chills, or other associated symptoms. He reports urinary urgency and sensation of incomplete voiding but is unsure of how long this has been present.   In ED, pt persistently nauseated and with vomiting, UA suggestive of UTI and BMP with slightly worsening renal failure. TR[facility] asked to admit for further evaluation and management.   Principal Problem:  Nausea and vomiting  - likely secondary to UTI in pt with immunocompromised status  - now clinically improving, continue to advance diet as pt able to tolerate  - supportive care with antiemetics, continue ABX  Active Problems:  RENAL FAILURE, ACUTE  - creatinine is trending down  - monitor renal function closely  - d/c IVF and encourage PO intake  SIRS (systemic inflammatory response syndrome)  - likely secondary to UTI, in pt with immuno compromised status  - on Zosyn, plan on transitioning to oral ABX as urine culture and sensitivity finalized, Cipro sensitive  - urine and blood culture final report with enterobacter specie  UTI (urinary tract infection)  - urine culture + for enterobacter  - on Zosyn but will plan on changing to oral ABX  - follow urine output  HYPERTENSION, UNSPECIFIED  - reasonable inpatient control  - will continue Labetalol and will monitor vitals per protocol  - readjust the regimen as indicated  Hyponatremia  - chronic in etiology, stable and at pt's baseline  - BMP in AM  Anemia of chronic disease  - Hg and Hct  stable and at pt's baseline  - CBC in AM  DM 2, uncontrolled with diabetic neuropathy, gastroparesis  - reasonable inpatient control  - last A1C 12/2012 was 7.4, no need for repeat A1C at this time  - continue Insulin detemir, victoza, and SSI , discontinue night time coverage - continue Reglan and Neurontin  History of renal transplant  - ~ 10 years ago  - continue Cyclosporine, Mycophenolate, and prednisone   Consultants:  None Procedures/Studies:  Dg Chest Port 1 View 02/03/2013 --> Stable cardiomegaly. No acute cardiopulmonary disease.  Antibiotics:  Rocephin 03/21 --> 03/22  Zosyn 03/22 -->  Code Status: Full  Family Communication: Pt at bedside  Disposition Plan: Home when medically stable   HPI/Subjective: No events overnight.   Objective: Filed Vitals:   02/05/13 2245 02/06/13 0540 02/06/13 0647 02/06/13 0653  BP: 155/75 148/89  170/84  Pulse: 83 73 79   Temp: 98.3 F (36.8 C) 98.6 F (37 C) 98.5 F (36.9 C)   TempSrc: Oral Oral Oral   Resp: 18 16 18    Height:      Weight:      SpO2: 100% 98% 100%     Intake/Output Summary (Last 24 hours) at 02/06/13 0915 Last data filed at 02/06/13 0551  Gross per 24 hour  Intake 1563.75 ml  Output   2000 ml  Net -436.25 ml    Exam:   General:  Pt is alert, follows commands appropriately, not in acute distress  Cardiovascular: Regular rate and rhythm, S1/S2, no  murmurs, no rubs, no gallops  Respiratory: Clear to auscultation bilaterally, no wheezing, no crackles, no rhonchi  Abdomen: Soft, non tender, non distended, bowel sounds present, no guarding  Extremities: No edema, pulses DP and PT palpable bilaterally  Neuro: Grossly nonfocal  Data Reviewed: Basic Metabolic Panel:  Recent Labs Lab 02/03/13 2238 02/04/13 0520 02/05/13 0419 02/06/13 0541  NA 132* 135 132* 138  K 4.8 4.7 4.2 4.0  CL 96 100 99 104  CO2 22 21 19 21   GLUCOSE 132* 144* 110* 72  BUN 49* 48* 47* 48*  CREATININE 2.79* 2.65* 2.42*  2.33*  CALCIUM 8.7 8.4 7.9* 8.2*   Liver Function Tests:  Recent Labs Lab 02/03/13 2238  AST 12  ALT 6  ALKPHOS 67  BILITOT 1.0  PROT 7.3  ALBUMIN 3.3*    Recent Labs Lab 02/03/13 2238  LIPASE 22    CBC:  Recent Labs Lab 02/03/13 2238 02/04/13 0520 02/05/13 0419 02/06/13 0541  WBC 10.4 8.6 9.2 6.0  NEUTROABS 7.8*  --   --   --   HGB 10.1* 9.8* 9.2* 8.6*  HCT 31.6* 30.7* 28.7* 26.8*  MCV 85.6 86.0 85.2 85.1  PLT 192 160 167 177   CBG:  Recent Labs Lab 02/05/13 1626 02/05/13 2246 02/06/13 0741 02/06/13 0803 02/06/13 0826  GLUCAP 135* 152* 57* 62* 82    Recent Results (from the past 240 hour(s))  URINE CULTURE     Status: None   Collection Time    02/03/13 10:54 PM      Result Value Range Status   Specimen Description URINE, CLEAN CATCH   Final   Special Requests NONE   Final   Culture  Setup Time 02/04/2013 04:36   Final   Colony Count >=100,000 COLONIES/ML   Final   Culture ENTEROBACTER AEROGENES   Final   Report Status 02/05/2013 FINAL   Final   Organism ID, Bacteria ENTEROBACTER AEROGENES   Final  CULTURE, BLOOD (ROUTINE X 2)     Status: None   Collection Time    02/04/13  5:20 AM      Result Value Range Status   Specimen Description BLOOD RIGHT WRIST   Final   Special Requests BOTTLES DRAWN AEROBIC ONLY 2CC   Final   Culture  Setup Time 02/04/2013 09:12   Final   Culture     Final   Value:        BLOOD CULTURE RECEIVED NO GROWTH TO DATE CULTURE WILL BE HELD FOR 5 DAYS BEFORE ISSUING A FINAL NEGATIVE REPORT   Report Status PENDING   Incomplete  CULTURE, BLOOD (ROUTINE X 2)     Status: None   Collection Time    02/04/13  5:20 AM      Result Value Range Status   Specimen Description BLOOD RIGHT ARM   Final   Special Requests BOTTLES DRAWN AEROBIC ONLY 3CC   Final   Culture  Setup Time 02/04/2013 09:12   Final   Culture     Final   Value:        BLOOD CULTURE RECEIVED NO GROWTH TO DATE CULTURE WILL BE HELD FOR 5 DAYS BEFORE ISSUING A FINAL  NEGATIVE REPORT   Report Status PENDING   Incomplete     Scheduled Meds: . aspirin EC  81 mg Oral Daily  . capsaicin  1 application Topical TID PC & HS  . cycloSPORINE modified  150 mg Oral BID  . enoxaparin injection  40 mg Subcutaneous  Q24H  . gabapentin  300 mg Oral BID  . insulin aspart  0-15 Units Subcutaneous TID WC  . insulin aspart  0-5 Units Subcutaneous QHS  . insulin detemir  15 Units Subcutaneous QHS  . labetalol  150 mg Oral BID  . Liraglutide  1.2 mg Subcutaneous Daily  . loratadine  10 mg Oral Daily  . metoCLOPramide  10 mg Oral TID AC  . mycophenolate  1,000 mg Oral BID  . pantoprazole  40 mg Oral Daily  . ZOSYN  IV  3.375 g Intravenous Q8H  . pravastatin  80 mg Oral q1800  . predniSONE  5 mg Oral Daily  . sodium bicarbonate  650 mg Oral BID   Continuous Infusions: . sodium chloride 75 mL/hr at 02/06/13 0550     Debbora Presto, MD  [facility] Pager [phone]  If 7PM-7AM, please contact night-coverage [email or web address] Password [email or web address] 02/06/2013, 9:15 AM   LOS: 3 days

## 2013-02-06 NOTE — Progress Notes (Signed)
02/06/13-Called [facility] RN & gave report via phone at 06:20. Pt's belonging packed & pt ready for transport to Rm#[id number] with him via his bedat 06:30am.

## 2013-02-06 NOTE — Progress Notes (Signed)
Hypoglycemic Event  CBG: 82  Treatment: 15 GM carbohydrate snack  Symptoms: None  Follow-up CBG: [id number] CBG Result:82  Possible Reasons for Event: Unknown  Comments/MD notified    [name]  Remember to initiate Hypoglycemia Order Set & complete

## 2013-02-06 NOTE — Progress Notes (Signed)
Hypoglycemic Event  CBG: 57  Treatment: 15 GM carbohydrate snack  Symptoms: None  Follow-up CBG: Time:0800 CBG Result:62  Possible Reasons for Event: Unknown  Comments/MD notified:    Jobe Gibbon  Remember to initiate Hypoglycemia Order Set & complete

## 2013-02-07 ENCOUNTER — Ambulatory Visit: Payer: Medicare HMO | Admitting: Internal Medicine

## 2013-02-07 LAB — BASIC METABOLIC PANEL
BUN: 34 mg/dL — ABNORMAL HIGH (ref 6–23)
CO2: 23 mEq/L (ref 19–32)
Calcium: 8.7 mg/dL (ref 8.4–10.5)
Creatinine, Ser: 1.75 mg/dL — ABNORMAL HIGH (ref 0.50–1.35)
Glucose, Bld: 100 mg/dL — ABNORMAL HIGH (ref 70–99)

## 2013-02-07 LAB — GLUCOSE, CAPILLARY: Glucose-Capillary: 84 mg/dL (ref 70–99)

## 2013-02-07 LAB — CBC
HCT: 27.4 % — ABNORMAL LOW (ref 39.0–52.0)
Hemoglobin: 8.7 g/dL — ABNORMAL LOW (ref 13.0–17.0)
MCH: 26.8 pg (ref 26.0–34.0)
MCV: 84.3 fL (ref 78.0–100.0)
Platelets: 208 10*3/uL (ref 150–400)
RBC: 3.25 MIL/uL — ABNORMAL LOW (ref 4.22–5.81)

## 2013-02-07 MED ORDER — CEFIXIME 400 MG PO TABS
400.0000 mg | ORAL_TABLET | Freq: Every day | ORAL | Status: DC
  Filled 2013-02-07: qty 1

## 2013-02-07 MED ORDER — CEFIXIME 400 MG PO TABS: 400.0000 mg | ORAL_TABLET | Freq: Every day | ORAL | Status: DC

## 2013-02-07 MED ORDER — LIRAGLUTIDE 18 MG/3ML ~~LOC~~ SOLN
0.6000 mg | Freq: Every day | SUBCUTANEOUS | Status: DC
  Administered 2013-02-07: 0.6 mg via SUBCUTANEOUS

## 2013-02-07 NOTE — Discharge Summary (Signed)
Physician Discharge Summary  [name] WUJ:[id number] DOB: Aug 04, 1951 DOA: 02/03/2013  PCP: No primary provider on file.  Admit date: 02/03/2013 Discharge date: 02/07/2013  Recommendations for Outpatient Follow-up:  1. Pt will need to follow up with PCP in 2-3 weeks post discharge 2. Please obtain BMP to evaluate electrolytes and kidney function 3. Please also check CBC to evaluate Hg and Hct levels 4. Please note that pt was discharged on Cefixime 400 mg tablet daily to take for 10 more days post discharge, stop date is 02/16/2013 5. Please note that pt has an appointment with Dr. Lewis Moccasin February 25, 2013 at 8:30, this was confirmed 6. Please also note that pt has an appointment scheduled with renal transplant specialist Dr. Jefm Miles 02/14/2013 at 9 am 7. Main reason for kidney transplant specialist follow up was recurrent UTI's with fevers as high as 104-105F, enterobacter sp (5 hospitalizations in the past six months)  Discharge Diagnoses: UTI with enterobacter species, pt with kidney transplant  Principal Problem:   Nausea and vomiting Active Problems:   RENAL FAILURE, ACUTE   SIRS (systemic inflammatory response syndrome)   UTI (urinary tract infection)   HYPERTENSION, UNSPECIFIED   Hyponatremia   Anemia of chronic disease   DM 2   History of renal transplant  Discharge Condition: Stable  Diet recommendation: Heart healthy diet discussed in details, low sodium diet discussed    Brief narrative:  Pt is 62 y.o. male with history of diabetes, CKD stage III, status post renal transplant 10 years ago, hypertension, who presented to [facility] ED with main concern of progressive nausea and non bloody vomiting that initially started one day prior to admission. Pt denied fevers, chills, or other associated symptoms. He reports urinary urgency and sensation of incomplete voiding but is unsure of how long this has been present.   In ED, pt persistently nauseated and with vomiting, UA  suggestive of UTI and BMP with slightly worsening renal failure. TRH asked to admit for further evaluation and management.   Principal Problem:  Nausea and vomiting  - likely secondary to UTI in pt with immunocompromised status  - now clinically improving, continue to advance diet as pt able to tolerate  - supportive care with antiemetics provided along with ABX Zosyn and pt has responded well - ABX changed to Cefixime as recommended by ID specialist  Active Problems:  RENAL FAILURE, ACUTE  - creatinine is trending down  - monitor renal function closely  - d/c IVF and encouraged PO intake, pt tolerating well   SIRS (systemic inflammatory response syndrome)  - likely secondary to UTI, in pt with immuno compromised status  - urine and blood culture final report with enterobacter species - follow up with renal transplant specialist recommended and appointment has been scheduled  UTI (urinary tract infection)  - urine culture + for enterobacter  - change AB to Cefixime today prior to discharge  HYPERTENSION, UNSPECIFIED  - reasonable inpatient control  - will continue Labetalol  Hyponatremia  - chronic in etiology, stable and at pt's baseline  Anemia of chronic disease  - Hg and Hct stable and at pt's baseline  DM 2, uncontrolled with diabetic neuropathy, gastroparesis  - reasonable inpatient control  - last A1C 12/2012 was 7.4, no need for repeat A1C at this time  - continue Insulin detemir, victoza History of renal transplant  - ~ 10 years ago  - continue Cyclosporine, Mycophenolate, and prednisone   Consultants:  ID specialist over the phone Procedures/Studies:  Dg Chest Port 1 View 02/03/2013 --> Stable cardiomegaly. No acute cardiopulmonary disease.  Antibiotics:  Rocephin 03/21 --> 03/22  Zosyn 03/22 --> 03/24 Cefixime 400 mg tablet QD 03/24 --> 02/16/2013  Code Status: Full  Family Communication: Pt and wife   Discharge Exam: Filed Vitals:   02/07/13 1233  BP:  182/77  Pulse: 72  Temp: 97.4 F (36.3 C)  Resp: 17   Filed Vitals:   02/06/13 2221 02/06/13 2315 02/07/13 0500 02/07/13 1233  BP: 202/80 180/78 166/64 182/77  Pulse: 78  74 72  Temp: 99.4 F (37.4 C)  98.7 F (37.1 C) 97.4 F (36.3 C)  TempSrc: Oral  Oral Oral  Resp: 18  18 17   Height:      Weight:      SpO2: 100%  100% 99%    General: Pt is alert, follows commands appropriately, not in acute distress Cardiovascular: Regular rate and rhythm, S1/S2 +, no murmurs, no rubs, no gallops Respiratory: Clear to auscultation bilaterally, no wheezing, no crackles, no rhonchi Abdominal: Soft, non tender, non distended, bowel sounds +, no guarding Extremities: no edema, no cyanosis, pulses palpable bilaterally DP and PT Neuro: Grossly nonfocal  Discharge Instructions  Discharge Orders   Future Appointments Provider Department Dept Phone   03/01/2013 9:30 AM [name], MD [facility] PRIMARY CARE ENDOCRINOLOGY [phone]   03/23/2013 10:30 AM Nicki Reaper, NP [facility] Primary Care [facility] [phone]   Future Orders Complete By Expires     Diet - low sodium heart healthy  As directed     Increase activity slowly  As directed         Medication List    TAKE these medications       albuterol 108 (90 BASE) MCG/ACT inhaler  Commonly known as:  PROVENTIL HFA;VENTOLIN HFA  Inhale 2 puffs into the lungs every 4 (four) hours as needed for wheezing.     ARTHRITIS PAIN RELIEF 650 MG CR tablet  Generic drug:  acetaminophen  Take 650-1,300 mg by mouth 2 (two) times daily as needed (arthritis).     aspirin EC 81 MG tablet  Take 81 mg by mouth daily.     capsaicin 0.025 % cream  Commonly known as:  ZOSTRIX  Apply 1 application topically 4 (four) times daily - after meals and at bedtime.     cefixime 400 MG tablet  Commonly known as:  SUPRAX  Take 1 tablet (400 mg total) by mouth daily.     cycloSPORINE modified 25 MG capsule  Commonly known as:  NEORAL  Take 50 mg by  mouth 2 (two) times daily. In addition to 100mg  po twice daily for total dose of 150mg  twice daily.     cycloSPORINE modified 100 MG capsule  Commonly known as:  NEORAL  Take 100 mg by mouth 2 (two) times daily. In addition to 50mg  twice daily for total 150 mg twice daily.     fexofenadine 180 MG tablet  Commonly known as:  ALLEGRA  Take 180 mg by mouth daily as needed. For allergies     fluticasone 50 MCG/ACT nasal spray  Commonly known as:  FLONASE  Place 1 spray into the nose daily as needed. Allergies or sinus congestion.     furosemide 80 MG tablet  Commonly known as:  LASIX  Take 80 mg by mouth daily.     gabapentin 300 MG capsule  Commonly known as:  NEURONTIN  Take 1 capsule (300 mg total) by mouth 2 (two)  times daily.     insulin detemir 100 UNIT/ML injection  Commonly known as:  LEVEMIR  Inject 15 Units into the skin at bedtime.     labetalol 300 MG tablet  Commonly known as:  NORMODYNE  Take 0.5 tablets (150 mg total) by mouth 2 (two) times daily.     Liraglutide 18 MG/3ML Soln injection  Commonly known as:  VICTOZA  Inject 0.2 mLs (1.2 mg total) into the skin daily.     metoCLOPramide 10 MG tablet  Commonly known as:  REGLAN  Take 1 tablet (10 mg total) by mouth daily.     mycophenolate 250 MG capsule  Commonly known as:  CELLCEPT  Take 1,000 mg by mouth 2 (two) times daily.     omeprazole 20 MG capsule  Commonly known as:  PRILOSEC  Take 20 mg by mouth daily.     pravastatin 80 MG tablet  Commonly known as:  PRAVACHOL  Take 80 mg by mouth at bedtime.     predniSONE 5 MG tablet  Commonly known as:  DELTASONE  Take 1 tablet (5 mg total) by mouth daily. Resume the prednisone 5 mg after your finished the 50 mg taper.     sodium bicarbonate 650 MG tablet  Take 1 tablet (650 mg total) by mouth 2 (two) times daily.           Follow-up Information   Follow up with COLADONATO,JOSEPH A, MD In 2 weeks.   Contact information:   [address]  [facility] [place] [address] [phone]       Follow up with Jefm Miles, MD.   Contact information:   [facility] [address] [place], [place] [phone]       The results of significant diagnostics from this hospitalization (including imaging, microbiology, ancillary and laboratory) are listed below for reference.     Microbiology: Recent Results (from the past 240 hour(s))  URINE CULTURE     Status: None   Collection Time    02/03/13 10:54 PM      Result Value Range Status   Specimen Description URINE, CLEAN CATCH   Final   Special Requests NONE   Final   Culture  Setup Time 02/04/2013 04:36   Final   Colony Count >=100,000 COLONIES/ML   Final   Culture ENTEROBACTER AEROGENES   Final   Report Status 02/05/2013 FINAL   Final   Organism ID, Bacteria ENTEROBACTER AEROGENES   Final  CULTURE, BLOOD (ROUTINE X 2)     Status: None   Collection Time    02/04/13  5:20 AM      Result Value Range Status   Specimen Description BLOOD RIGHT WRIST   Final   Special Requests BOTTLES DRAWN AEROBIC ONLY 2CC   Final   Culture  Setup Time 02/04/2013 09:12   Final   Culture     Final   Value:        BLOOD CULTURE RECEIVED NO GROWTH TO DATE CULTURE WILL BE HELD FOR 5 DAYS BEFORE ISSUING A FINAL NEGATIVE REPORT   Report Status PENDING   Incomplete  CULTURE, BLOOD (ROUTINE X 2)     Status: None   Collection Time    02/04/13  5:20 AM      Result Value Range Status   Specimen Description BLOOD RIGHT ARM   Final   Special Requests BOTTLES DRAWN AEROBIC ONLY 3CC   Final   Culture  Setup Time 02/04/2013 09:12  Final   Culture     Final   Value:        BLOOD CULTURE RECEIVED NO GROWTH TO DATE CULTURE WILL BE HELD FOR 5 DAYS BEFORE ISSUING A FINAL NEGATIVE REPORT   Report Status PENDING   Incomplete     Labs: Basic Metabolic Panel:  Recent Labs Lab 02/03/13 2238 02/04/13 0520 02/05/13 0419 02/06/13 0541 02/07/13 0441  NA  132* 135 132* 138 138  K 4.8 4.7 4.2 4.0 3.9  CL 96 100 99 104 104  CO2 22 21 19 21 23   GLUCOSE 132* 144* 110* 72 100*  BUN 49* 48* 47* 48* 34*  CREATININE 2.79* 2.65* 2.42* 2.33* 1.75*  CALCIUM 8.7 8.4 7.9* 8.2* 8.7   Liver Function Tests:  Recent Labs Lab 02/03/13 2238  AST 12  ALT 6  ALKPHOS 67  BILITOT 1.0  PROT 7.3  ALBUMIN 3.3*    Recent Labs Lab 02/03/13 2238  LIPASE 22   CBC:  Recent Labs Lab 02/03/13 2238 02/04/13 0520 02/05/13 0419 02/06/13 0541 02/07/13 0441  WBC 10.4 8.6 9.2 6.0 5.1  NEUTROABS 7.8*  --   --   --   --   HGB 10.1* 9.8* 9.2* 8.6* 8.7*  HCT 31.6* 30.7* 28.7* 26.8* 27.4*  MCV 85.6 86.0 85.2 85.1 84.3  PLT 192 160 167 177 208    BNP (last 3 results)  Recent Labs  12/01/12 0943  PROBNP 4046.0*   CBG:  Recent Labs Lab 02/06/13 0826 02/06/13 1157 02/06/13 1737 02/07/13 0758 02/07/13 1213  GLUCAP 82 124* 153* 84 109*     SIGNED: Time coordinating discharge: Over 30 minutes  Debbora Presto, MD  [facility] 02/07/2013, 1:12 PM Pager [phone]  If 7PM-7AM, please contact night-coverage [email or web address] Password [email or web address]

## 2013-02-07 NOTE — Evaluation (Signed)
Physical Therapy Evaluation Patient Details Name: [name] MRN: [id number] DOB: [birth year] Today's Date: 02/07/2013 Time: 4098-1191 PT Time Calculation (min): 18 min  PT Assessment / Plan / Recommendation Clinical Impression  Pt is 62 yo male admitted 02/03/13 with altered LOC, N/V, UTI.Marland Kitchen Pt. has h/o renal transplant, DM, peripheral neuropathy. Pt. recently in hospital. Pt. will benefit from PT while in acute care to improve functional independence. Pt. receiving HHPT already.    PT Assessment  Patient needs continued PT services    Follow Up Recommendations  Home health PT    Does the patient have the potential to tolerate intense rehabilitation      Barriers to Discharge        Equipment Recommendations  None recommended by PT    Recommendations for Other Services     Frequency Min 3X/week    Precautions / Restrictions Precautions Precautions: Fall   Pertinent Vitals/Pain C/o L knee pain.      Mobility  Transfers Sit to Stand: 5: Supervision;With armrests;From chair/3-in-1;With upper extremity assist Stand to Sit: With upper extremity assist;With armrests;To chair/3-in-1 Ambulation/Gait Ambulation/Gait Assistance: 4: Min guard Ambulation Distance (Feet): 50 Feet Assistive device: Rolling walker Gait Pattern: Step-through pattern;Decreased hip/knee flexion - right;Decreased hip/knee flexion - left;Decreased stride length;Trunk flexed;Antalgic;Decreased stance time - left    Exercises     PT Diagnosis: Abnormality of gait;Acute pain  PT Problem List: Decreased activity tolerance;Decreased balance;Pain;Decreased mobility PT Treatment Interventions: Gait training;DME instruction;Functional mobility training;Therapeutic activities;Therapeutic exercise;Patient/family education;Balance training   PT Goals Acute Rehab PT Goals PT Goal Formulation: With patient Time For Goal Achievement: 02/21/13 Potential to Achieve Goals: Good Pt will Ambulate: >150  feet;with modified independence;with least restrictive assistive device PT Goal: Ambulate - Progress: Goal set today Pt will Perform Home Exercise Program: Independently PT Goal: Perform Home Exercise Program - Progress: Goal set today  Visit Information  Last PT Received On: 02/07/13 Assistance Needed: +1    Subjective Data  Subjective: I don't know if I can walk. I've been in te bed so long.I need some fliuid drawn from my knee.   Prior Functioning  Home Living Lives With: Spouse Available Help at Discharge: Family;Available 24 hours/day Type of Home: House Home Access: Level entry Home Layout: One level Bathroom Shower/Tub: Walk-in shower Home Adaptive Equipment: Environmental consultant - four wheeled;Walker - rolling Prior Function Level of Independence: Independent with assistive device(s) Communication Communication: No difficulties    Cognition  Cognition Overall Cognitive Status: Appears within functional limits for tasks assessed/performed Arousal/Alertness: Awake/alert Orientation Level: Appears intact for tasks assessed Behavior During Session: [facility] for tasks performed    Extremity/Trunk Assessment Right Upper Extremity Assessment RUE ROM/Strength/Tone: [facility] for tasks assessed Left Upper Extremity Assessment LUE ROM/Strength/Tone: [facility] for tasks assessed Right Lower Extremity Assessment RLE ROM/Strength/Tone: [facility] for tasks assessed RLE Sensation: Deficits;History of peripheral neuropathy RLE Sensation Deficits: diminished on both feet to light touch Left Lower Extremity Assessment LLE ROM/Strength/Tone: WFL for tasks assessed LLE Sensation: Deficits;History of peripheral neuropathy LLE Sensation Deficits: diminished on both feet to light touch   Balance Static Standing Balance Static Standing - Balance Support: Bilateral upper extremity supported Static Standing - Level of Assistance: 5: Stand by assistance  End of Session PT - End of Session Equipment Utilized During  Treatment: Gait belt Activity Tolerance: Patient tolerated treatment well Patient left: in chair  GP     Rada Hay PT 02/07/2013, 12:46 [id number]

## 2013-02-07 NOTE — Care Management (Signed)
Cm spoke with patient concerning discharge planning. Pt states resides home with spouse. Spouse is currently out of town, pt to reside with brother until spouse returns Thursday. Pt followed by [facility]. Pt followed by [facility] for [facility] services. Pt states using RW at home. No other needs stated.   Roxy Manns Clydene Burack,RN,BSN [phone]

## 2013-02-07 NOTE — Progress Notes (Signed)
Pt D/C to home, alert and oriented, no new complains, pt denies pain, D/C instructions done , medication administration done, pt verbalizes understanding

## 2013-02-07 NOTE — Progress Notes (Signed)
Pt IV site leaking.  Pt says he may go home today and would like to wait to see if he is discharged before attempting a new IV site.  Leaking IV removed.  Catheter intact.

## 2013-02-07 NOTE — Progress Notes (Signed)
Patient readmitted on 3.20.14 for recurrent UTI.  Patient is currently active with [facility] Care [facility] for chronic disease management services.  Patient has been engaged by a [employer].  Our community based plan of care has focused on disease management of COPD, Diabetes, and HTN management and education.  Spoke with Dr Izola Price regarding plan to reduce patient's risk of readmission.  MD has referred patient back to his renal transplant [facility] in [facility] to formulate a home management plan.  Spoke with RNCM to request [facility] from [facility].  Spoke with [facility] representative Lanae Crumbly to request that their in home team confirm that the patient has a working weight scale as the patient has indicated that his no longer works consistently.  Patient will receive a post discharge transition of care call and will be evaluated for monthly home visits for assessments and disease process education.  Made Sharmon Leyden RN inpatient Case Manager aware that [facility] Management following. Of note, [facility] Care Management services does not replace or interfere with any services that are arranged by inpatient case management or social work.  For additional questions or referrals please contact Anibal Henderson BSN RN [facility] Liaison at [phone].

## 2013-02-10 LAB — CULTURE, BLOOD (ROUTINE X 2): Culture: NO GROWTH

## 2013-02-24 DIAGNOSIS — I129 Hypertensive chronic kidney disease with stage 1 through stage 4 chronic kidney disease, or unspecified chronic kidney disease: Secondary | ICD-10-CM

## 2013-02-24 DIAGNOSIS — B9689 Other specified bacterial agents as the cause of diseases classified elsewhere: Secondary | ICD-10-CM

## 2013-02-24 DIAGNOSIS — N189 Chronic kidney disease, unspecified: Secondary | ICD-10-CM

## 2013-02-24 DIAGNOSIS — N390 Urinary tract infection, site not specified: Secondary | ICD-10-CM

## 2013-03-01 ENCOUNTER — Ambulatory Visit (INDEPENDENT_AMBULATORY_CARE_PROVIDER_SITE_OTHER): Payer: Medicare HMO | Admitting: Internal Medicine

## 2013-03-01 ENCOUNTER — Encounter: Payer: Self-pay | Admitting: Internal Medicine

## 2013-03-01 ENCOUNTER — Other Ambulatory Visit (HOSPITAL_COMMUNITY): Payer: Self-pay | Admitting: Orthopaedic Surgery

## 2013-03-01 VITALS — BP 134/72 | HR 87 | Temp 98.1°F | Resp 10 | Wt 257.0 lb

## 2013-03-01 DIAGNOSIS — N058 Unspecified nephritic syndrome with other morphologic changes: Secondary | ICD-10-CM

## 2013-03-01 DIAGNOSIS — E1129 Type 2 diabetes mellitus with other diabetic kidney complication: Secondary | ICD-10-CM

## 2013-03-01 DIAGNOSIS — E1121 Type 2 diabetes mellitus with diabetic nephropathy: Secondary | ICD-10-CM

## 2013-03-01 NOTE — Patient Instructions (Signed)
Please continue the same dose of Levemir. Increase Victoza to 1.2 mg daily. Please return in 2 month with your sugar.

## 2013-03-01 NOTE — Progress Notes (Signed)
Subjective:     Patient ID: [name], male   DOB: 01-Sep-1951, 62 y.o.   MRN: [id number]  HPI [name] is a 62 year old African American man returning for management of DM2, dx 1970's, insulin-dependent, uncontrolled, with complications (peripheral neuropathy, CKD with history of renal transplant in 2003, gastroparesis, and likely diabetic retinopathy, status post intraocular hemorrhage status post laser surgery). He is here with his wife.  Pt has been on insulin from diagnosis. He developed chronic kidney disease, and was on dialysis for about 3 years before having a kidney transplant 10 years ago: he now takes Prednisone 5, Cellcept, Cyclosporine. He sees Dr. Arrie Aran with nephrology.   Last hemoglobin A1c: Lab Results  Component Value Date   HGBA1C 7.4* 01/02/2013  prior to this 7.2%.  He is on: - Levemir 15 units qhs - Victoza 1.2 mg started last time - but pt and his wife mention this is difficult to afford: 300$ per pen (???)  He checks his sugars 2 times a day, and they are at goal in the morning, around 100. They become higher as the day goes on and especially after dinner. However, upon questioning, he is checking about 1h after meal. No lows.  He was admitted with N/V recently (03/20-24/2014) and found to have a UTI. They tell me this is the 6th UTI in last year. He will see a urologist soon. He was followed by Dr. Mortimer Fries (transplant medicine at [facility]) for 2 years after his kidney transplant but then stopped going back, despite advice to be seen every year. He finally returned to see him on 02/14/2013. He was placed back on Bactrim, he was off this "for years". I checked his recent hospitalization records and his sugars were in the 70-140 range.   He has gastroparesis (on Reglan), CKD, peripheral neuropathy (on Gabapentin - 300 mg bid), he has DR and had laser surgery for bleeding.   Review of Systems Constitutional:No weight gain/loss, no fatigue, no subjective  hyperthermia/hypothermia Eyes: no blurry vision, no xerophthalmia ENT: no sore throat, no nodules palpated in throat, no dysphagia/odynophagia, no hoarseness Cardiovascular: no CP/SOB/palpitations/leg swelling Respiratory: no cough/SOB Gastrointestinal: no N/V/D/C Musculoskeletal: no muscle/joint aches Skin: no rashes Neurological: no tremors/numbness/tingling/dizziness Psychiatric: no depression/anxiety  I reviewed pt's medications, allergies, PMH, social hx, family hx and no changes required.   Insurance prefers [employer] testing supplies.  Objective:   Physical Exam BP 134/72  Pulse 87  Temp(Src) 98.1 F (36.7 C) (Oral)  Resp 10  Wt 257 lb (116.574 kg)  BMI 33.91 kg/m2  SpO2 96% Wt Readings from Last 3 Encounters:  03/01/13 257 lb (116.574 kg)  02/04/13 256 lb 2.8 oz (116.2 kg)  01/27/13 259 lb 4 oz (117.595 kg)   Constitutional: overweight, in NAD Eyes: PERRLA, EOMI, no exophthalmos ENT: moist mucous membranes, no thyromegaly, no cervical lymphadenopathy Cardiovascular: RRR, No MRG Respiratory: CTA B Gastrointestinal: abdomen soft, NT, ND, BS+ Musculoskeletal: no deformities, strength intact in all 4 Skin: moist, warm, no rashes Neurological: no tremor with outstretched hands, DTR normal in all 4    Assessment:     1. DM2, insulin-dependent, uncontrolled, with complications  - peripheral neuropathy - CKD with history of renal transplant in 2003 - gastroparesis - likely diabetic retinopathy, status post intraocular hemorrhage status post laser surgery    Plan:     Patient with a very long history of diabetes, doing well on the current dose of Levemir 15 units each bedtime, with a.m. sugars at goal,  around 100. Will continue this dose. - However his postprandial sugars are still a little higher, between 150 and 180 with an occasional 190 or 200. Upon review of patient's detailed CBG log (in which he also writes down the med doses used daily), he is still using  Victoza at 0.6 mg daily, despite advice to increase it to 1.2 mg daily after a week upon starting it. We do have room to increase this to 1.2 mg, which I advised him to do today.  - given discount card for Victoza (to pay 25$ for 1 month supply) - they will try to use this. If it does not work with his insurance, will need to change tx, likely to mealtime insulin - Given 2 Victoza pen samples - I advised him to check his post meal sugars 2 (not 1) hours after he eats - I will see the patient back in 2 months with his sugar log - No labs for today, will need to check a hemoglobin A1c at next visit

## 2013-03-02 ENCOUNTER — Encounter (HOSPITAL_COMMUNITY): Payer: Self-pay | Admitting: Pharmacy Technician

## 2013-03-05 ENCOUNTER — Encounter (HOSPITAL_COMMUNITY): Payer: Self-pay

## 2013-03-05 ENCOUNTER — Emergency Department (HOSPITAL_COMMUNITY): Payer: Medicare HMO

## 2013-03-05 ENCOUNTER — Inpatient Hospital Stay (HOSPITAL_COMMUNITY)
Admission: EM | Admit: 2013-03-05 | Discharge: 2013-03-08 | DRG: 312 | Disposition: A | Discharge status: Home / Self Care | Payer: Medicare HMO | Attending: Internal Medicine | Admitting: Internal Medicine

## 2013-03-05 DIAGNOSIS — K3184 Gastroparesis: Secondary | ICD-10-CM | POA: Diagnosis present

## 2013-03-05 DIAGNOSIS — R259 Unspecified abnormal involuntary movements: Secondary | ICD-10-CM | POA: Diagnosis present

## 2013-03-05 DIAGNOSIS — N189 Chronic kidney disease, unspecified: Secondary | ICD-10-CM

## 2013-03-05 DIAGNOSIS — R9431 Abnormal electrocardiogram [ECG] [EKG]: Secondary | ICD-10-CM | POA: Diagnosis present

## 2013-03-05 DIAGNOSIS — E1129 Type 2 diabetes mellitus with other diabetic kidney complication: Secondary | ICD-10-CM | POA: Diagnosis present

## 2013-03-05 DIAGNOSIS — R5381 Other malaise: Secondary | ICD-10-CM | POA: Diagnosis present

## 2013-03-05 DIAGNOSIS — D631 Anemia in chronic kidney disease: Secondary | ICD-10-CM | POA: Diagnosis present

## 2013-03-05 DIAGNOSIS — M25569 Pain in unspecified knee: Secondary | ICD-10-CM | POA: Diagnosis present

## 2013-03-05 DIAGNOSIS — D638 Anemia in other chronic diseases classified elsewhere: Secondary | ICD-10-CM

## 2013-03-05 DIAGNOSIS — Z940 Kidney transplant status: Secondary | ICD-10-CM

## 2013-03-05 DIAGNOSIS — E669 Obesity, unspecified: Secondary | ICD-10-CM

## 2013-03-05 DIAGNOSIS — E1121 Type 2 diabetes mellitus with diabetic nephropathy: Secondary | ICD-10-CM

## 2013-03-05 DIAGNOSIS — E1149 Type 2 diabetes mellitus with other diabetic neurological complication: Secondary | ICD-10-CM | POA: Diagnosis present

## 2013-03-05 DIAGNOSIS — M25562 Pain in left knee: Secondary | ICD-10-CM | POA: Diagnosis present

## 2013-03-05 DIAGNOSIS — N058 Unspecified nephritic syndrome with other morphologic changes: Secondary | ICD-10-CM | POA: Diagnosis present

## 2013-03-05 DIAGNOSIS — E1043 Type 1 diabetes mellitus with diabetic autonomic (poly)neuropathy: Secondary | ICD-10-CM

## 2013-03-05 DIAGNOSIS — R55 Syncope and collapse: Secondary | ICD-10-CM | POA: Diagnosis present

## 2013-03-05 DIAGNOSIS — N179 Acute kidney failure, unspecified: Secondary | ICD-10-CM | POA: Diagnosis present

## 2013-03-05 DIAGNOSIS — G909 Disorder of the autonomic nervous system, unspecified: Secondary | ICD-10-CM | POA: Diagnosis present

## 2013-03-05 DIAGNOSIS — G4733 Obstructive sleep apnea (adult) (pediatric): Secondary | ICD-10-CM | POA: Diagnosis present

## 2013-03-05 DIAGNOSIS — N184 Chronic kidney disease, stage 4 (severe): Secondary | ICD-10-CM | POA: Diagnosis present

## 2013-03-05 DIAGNOSIS — E785 Hyperlipidemia, unspecified: Secondary | ICD-10-CM

## 2013-03-05 DIAGNOSIS — E13319 Other specified diabetes mellitus with unspecified diabetic retinopathy without macular edema: Secondary | ICD-10-CM

## 2013-03-05 DIAGNOSIS — I119 Hypertensive heart disease without heart failure: Secondary | ICD-10-CM

## 2013-03-05 DIAGNOSIS — G4730 Sleep apnea, unspecified: Secondary | ICD-10-CM

## 2013-03-05 DIAGNOSIS — N289 Disorder of kidney and ureter, unspecified: Secondary | ICD-10-CM

## 2013-03-05 DIAGNOSIS — Z79899 Other long term (current) drug therapy: Secondary | ICD-10-CM

## 2013-03-05 DIAGNOSIS — I1310 Hypertensive heart and chronic kidney disease without heart failure, with stage 1 through stage 4 chronic kidney disease, or unspecified chronic kidney disease: Secondary | ICD-10-CM | POA: Diagnosis present

## 2013-03-05 DIAGNOSIS — R251 Tremor, unspecified: Secondary | ICD-10-CM

## 2013-03-05 DIAGNOSIS — T502X5A Adverse effect of carbonic-anhydrase inhibitors, benzothiadiazides and other diuretics, initial encounter: Secondary | ICD-10-CM | POA: Diagnosis present

## 2013-03-05 DIAGNOSIS — N039 Chronic nephritic syndrome with unspecified morphologic changes: Secondary | ICD-10-CM | POA: Diagnosis present

## 2013-03-05 DIAGNOSIS — Z87440 Personal history of urinary (tract) infections: Secondary | ICD-10-CM

## 2013-03-05 DIAGNOSIS — I951 Orthostatic hypotension: Principal | ICD-10-CM | POA: Diagnosis present

## 2013-03-05 HISTORY — DX: Hypertensive heart disease without heart failure: I11.9

## 2013-03-05 HISTORY — DX: Type 2 diabetes mellitus with other diabetic kidney complication: E11.29

## 2013-03-05 LAB — COMPREHENSIVE METABOLIC PANEL
Alkaline Phosphatase: 61 U/L (ref 39–117)
CO2: 26 mEq/L (ref 19–32)
Calcium: 9.4 mg/dL (ref 8.4–10.5)
Chloride: 101 mEq/L (ref 96–112)
GFR calc Af Amer: 28 mL/min — ABNORMAL LOW (ref >90)
Sodium: 137 mEq/L (ref 135–145)
Total Bilirubin: 0.4 mg/dL (ref 0.3–1.2)

## 2013-03-05 LAB — URINALYSIS, ROUTINE W REFLEX MICROSCOPIC
Bilirubin Urine: NEGATIVE
Glucose, UA: NEGATIVE mg/dL
Hgb urine dipstick: NEGATIVE
Ketones, ur: NEGATIVE mg/dL
Specific Gravity, Urine: 1.019 (ref 1.005–1.030)
pH: 5 (ref 5.0–8.0)

## 2013-03-05 LAB — CBC WITH DIFFERENTIAL/PLATELET
Basophils Absolute: 0 10*3/uL (ref 0.0–0.1)
Basophils Relative: 0 % (ref 0–1)
Eosinophils Relative: 3 % (ref 0–5)
Lymphocytes Relative: 23 % (ref 12–46)
MCHC: 31.5 g/dL (ref 30.0–36.0)
Neutro Abs: 2.6 10*3/uL (ref 1.7–7.7)
Platelets: 247 10*3/uL (ref 150–400)
RDW: 14.2 % (ref 11.5–15.5)
WBC: 4.8 10*3/uL (ref 4.0–10.5)

## 2013-03-05 LAB — POCT I-STAT TROPONIN I: Troponin i, poc: 0.01 ng/mL (ref 0.00–0.08)

## 2013-03-05 LAB — URINE MICROSCOPIC-ADD ON

## 2013-03-05 LAB — TROPONIN I: Troponin I: <0.3 ng/mL (ref <0.30)

## 2013-03-05 MED ORDER — SIMVASTATIN 40 MG PO TABS
40.0000 mg | ORAL_TABLET | Freq: Every day | ORAL | Status: DC
  Filled 2013-03-05: qty 1

## 2013-03-05 MED ORDER — DOCUSATE SODIUM 100 MG PO CAPS
100.0000 mg | ORAL_CAPSULE | Freq: Every day | ORAL | Status: DC | PRN
  Filled 2013-03-05: qty 1

## 2013-03-05 MED ORDER — LORATADINE 10 MG PO TABS
10.0000 mg | ORAL_TABLET | Freq: Every day | ORAL | Status: DC
  Administered 2013-03-05 – 2013-03-07 (×3): 10 mg via ORAL
  Filled 2013-03-05 (×4): qty 1

## 2013-03-05 MED ORDER — SODIUM CHLORIDE 0.9 % IJ SOLN
3.0000 mL | Freq: Two times a day (BID) | INTRAMUSCULAR | Status: DC
  Administered 2013-03-06 – 2013-03-07 (×2): 3 mL via INTRAVENOUS

## 2013-03-05 MED ORDER — ACETAMINOPHEN 325 MG PO TABS: 650.0000 mg | ORAL_TABLET | Freq: Four times a day (QID) | ORAL | Status: DC | PRN

## 2013-03-05 MED ORDER — ENOXAPARIN SODIUM 60 MG/0.6ML ~~LOC~~ SOLN
60.0000 mg | SUBCUTANEOUS | Status: DC
Start: 2013-03-05 — End: 2013-03-08
  Administered 2013-03-05 – 2013-03-07 (×3): 60 mg via SUBCUTANEOUS
  Filled 2013-03-05 (×4): qty 0.6

## 2013-03-05 MED ORDER — CYCLOSPORINE MODIFIED (NEORAL) 25 MG PO CAPS
50.0000 mg | ORAL_CAPSULE | Freq: Two times a day (BID) | ORAL | Status: DC
  Administered 2013-03-05 – 2013-03-07 (×5): 50 mg via ORAL
  Filled 2013-03-05 (×7): qty 2

## 2013-03-05 MED ORDER — PANTOPRAZOLE SODIUM 40 MG PO TBEC
40.0000 mg | DELAYED_RELEASE_TABLET | Freq: Every day | ORAL | Status: DC
  Administered 2013-03-05 – 2013-03-07 (×3): 40 mg via ORAL
  Filled 2013-03-05 (×4): qty 1

## 2013-03-05 MED ORDER — GABAPENTIN 300 MG PO CAPS
300.0000 mg | ORAL_CAPSULE | Freq: Two times a day (BID) | ORAL | Status: DC
  Administered 2013-03-05 – 2013-03-08 (×6): 300 mg via ORAL
  Filled 2013-03-05 (×8): qty 1

## 2013-03-05 MED ORDER — SODIUM CHLORIDE 0.9 % IV SOLN
INTRAVENOUS | Status: AC
  Administered 2013-03-05: 75 mL via INTRAVENOUS
  Administered 2013-03-06: 11:00:00 via INTRAVENOUS

## 2013-03-05 MED ORDER — SULFAMETHOXAZOLE-TRIMETHOPRIM 400-80 MG PO TABS
1.0000 | ORAL_TABLET | ORAL | Status: DC
  Administered 2013-03-07: 1 via ORAL
  Filled 2013-03-05: qty 1

## 2013-03-05 MED ORDER — MYCOPHENOLATE MOFETIL 250 MG PO CAPS
500.0000 mg | ORAL_CAPSULE | Freq: Two times a day (BID) | ORAL | Status: DC
  Administered 2013-03-05 – 2013-03-07 (×5): 500 mg via ORAL
  Filled 2013-03-05 (×7): qty 2

## 2013-03-05 MED ORDER — ENOXAPARIN SODIUM 30 MG/0.3ML ~~LOC~~ SOLN: 30.0000 mg | SUBCUTANEOUS | Status: DC

## 2013-03-05 MED ORDER — FLUTICASONE PROPIONATE 50 MCG/ACT NA SUSP: 1.0000 | Freq: Every day | NASAL | Status: DC | PRN

## 2013-03-05 MED ORDER — LABETALOL HCL 300 MG PO TABS
150.0000 mg | ORAL_TABLET | Freq: Two times a day (BID) | ORAL | Status: DC
  Administered 2013-03-05 – 2013-03-07 (×5): 150 mg via ORAL
  Filled 2013-03-05 (×8): qty 0.5

## 2013-03-05 MED ORDER — POLYETHYLENE GLYCOL 3350 17 GM/SCOOP PO POWD
17.0000 g | Freq: Every day | ORAL | Status: DC | PRN
  Filled 2013-03-05: qty 255

## 2013-03-05 MED ORDER — ONDANSETRON HCL 4 MG/2ML IJ SOLN
4.0000 mg | Freq: Once | INTRAMUSCULAR | Status: DC
  Filled 2013-03-05: qty 2

## 2013-03-05 MED ORDER — INSULIN DETEMIR 100 UNIT/ML ~~LOC~~ SOLN
15.0000 [IU] | Freq: Every day | SUBCUTANEOUS | Status: DC
  Administered 2013-03-05 – 2013-03-06 (×2): 15 [IU] via SUBCUTANEOUS
  Filled 2013-03-05 (×3): qty 0.15

## 2013-03-05 MED ORDER — LIRAGLUTIDE 18 MG/3ML ~~LOC~~ SOLN
1.2000 mg | Freq: Every day | SUBCUTANEOUS | Status: DC
  Administered 2013-03-07: 1.2 mg via SUBCUTANEOUS

## 2013-03-05 MED ORDER — ONDANSETRON HCL 4 MG PO TABS: 4.0000 mg | ORAL_TABLET | Freq: Four times a day (QID) | ORAL | Status: DC | PRN

## 2013-03-05 MED ORDER — ASPIRIN EC 81 MG PO TBEC
81.0000 mg | DELAYED_RELEASE_TABLET | Freq: Every day | ORAL | Status: DC
  Administered 2013-03-06 – 2013-03-07 (×2): 81 mg via ORAL
  Filled 2013-03-05 (×3): qty 1

## 2013-03-05 MED ORDER — PREDNISONE 5 MG PO TABS
5.0000 mg | ORAL_TABLET | Freq: Every day | ORAL | Status: DC
  Administered 2013-03-06 – 2013-03-07 (×2): 5 mg via ORAL
  Filled 2013-03-05 (×4): qty 1

## 2013-03-05 MED ORDER — ONDANSETRON HCL 4 MG/2ML IJ SOLN: 4.0000 mg | Freq: Four times a day (QID) | INTRAMUSCULAR | Status: DC | PRN

## 2013-03-05 MED ORDER — HYDROCODONE-ACETAMINOPHEN 5-325 MG PO TABS: 1.0000 | ORAL_TABLET | Freq: Four times a day (QID) | ORAL | Status: DC | PRN

## 2013-03-05 MED ORDER — SODIUM CHLORIDE 0.9 % IV BOLUS (SEPSIS)
500.0000 mL | Freq: Once | INTRAVENOUS | Status: AC
  Administered 2013-03-05: 500 mL via INTRAVENOUS

## 2013-03-05 MED ORDER — METOCLOPRAMIDE HCL 10 MG PO TABS
10.0000 mg | ORAL_TABLET | Freq: Every day | ORAL | Status: DC
  Administered 2013-03-06 – 2013-03-07 (×2): 10 mg via ORAL
  Filled 2013-03-05 (×3): qty 1

## 2013-03-05 MED ORDER — CAPSAICIN 0.025 % EX CREA
1.0000 "application " | TOPICAL_CREAM | Freq: Three times a day (TID) | CUTANEOUS | Status: DC
  Administered 2013-03-05 – 2013-03-07 (×9): 1 via TOPICAL
  Filled 2013-03-05: qty 56.6

## 2013-03-05 MED ORDER — POLYETHYLENE GLYCOL 3350 17 G PO PACK
17.0000 g | PACK | Freq: Every day | ORAL | Status: DC | PRN
  Filled 2013-03-05: qty 1

## 2013-03-05 MED ORDER — CYCLOSPORINE MODIFIED (NEORAL) 100 MG PO CAPS
100.0000 mg | ORAL_CAPSULE | Freq: Two times a day (BID) | ORAL | Status: DC
  Administered 2013-03-05 – 2013-03-07 (×5): 100 mg via ORAL
  Filled 2013-03-05 (×7): qty 1

## 2013-03-05 NOTE — H&P (Signed)
[facility] History and Physical  [name] ZOX:[id number] DOB: 1950/11/24 DOA: 03/05/2013  Referring physician: Dr Anitra Lauth PCP: . Dr Rene Kocher baitly Specialists: Dr Rich Reining.  Chief Complaint: passed out on Wednesday, feeling since then  HPI: [name] is a 62 y.o. male with multiple medical problems , including, h/o renal transplant on chronic immunosuppression, hypertension, diabetes mellitus, CKD stage 3 , multiple falls int he past, came in for worsening generalized weakness, lower extremity weakness, after a fall on Wednesday , loc for few seconds, . Denies fever or chills, chest pain, dizziness, sob, palpitations, has h/o sleep apnea on cpap. As per the wife at bedside, he has been getting weaker and now having difficulty walking. He is being admitted to medical service for evaluation of syncope. His last echo was in January 2014 and showed good lvef, moderate LVH, .   Review of Systems: The patient denies anorexia, fever, ,, vision loss, decreased hearing, hoarseness, chest pain, , dyspnea on exertion, peripheral edema, balance deficits, hemoptysis, abdominal pain, melena, hematochezia, severe indigestion/heartburn, hematuria, incontinence, genital sores, suspicious skin lesions, transient blindness,  depression, unusual weight change, abnormal bleeding, enlarged lymph nodes, angioedema, and breast masses.    Past Medical History  Diagnosis Date  . Diabetes mellitus   . Hypertension   . Kidney transplant as cause of abnormal reaction or later complication     In [name]-Dr. Colodonato-Dr. Lawerance Cruel is the Transplant dodctor at [facility]  . Hyperlipidemia   . Chronic kidney disease   . Neuromuscular disorder    Past Surgical History  Procedure Laterality Date  . Kidney transplant     Social History:  reports that he has quit smoking. He has never used smokeless tobacco. He reports that he does not drink alcohol or use illicit drugs. where does patient  live--home, Uses walker  Allergies  Allergen Reactions  . Iodine Anaphylaxis    Iv dye  . Shellfish Allergy Anaphylaxis and Swelling    Has throat swelling, tongue swelling, and entire body swells up.     Family History  Problem Relation Age of Onset  . Diabetes Mother   . Diabetes Father   . Kidney failure Brother   . Heart failure Sister     Prior to Admission medications   Medication Sig Start Date End Date Taking? Authorizing Provider  acetaminophen (ARTHRITIS PAIN RELIEF) 650 MG CR tablet Take 650-1,300 mg by mouth 2 (two) times daily as needed (arthritis).    Yes Historical Provider, MD  aspirin EC 81 MG tablet Take 81 mg by mouth daily.   Yes Historical Provider, MD  capsaicin (ZOSTRIX) 0.025 % cream Apply 1 application topically 4 (four) times daily - after meals and at bedtime.   Yes Historical Provider, MD  cycloSPORINE modified (NEORAL) 100 MG capsule Take 100 mg by mouth 2 (two) times daily. In addition to 50mg  twice daily for total 150 mg twice daily.   Yes Historical Provider, MD  cycloSPORINE modified (NEORAL) 25 MG capsule Take 50 mg by mouth 2 (two) times daily. In addition to 100mg  po twice daily for total dose of 150mg  twice daily.   Yes Historical Provider, MD  docusate sodium (COLACE) 100 MG capsule Take 100 mg by mouth daily as needed for constipation.   Yes Historical Provider, MD  fexofenadine (ALLEGRA) 180 MG tablet Take 180 mg by mouth daily as needed. For allergies   Yes Historical Provider, MD  fluticasone (FLONASE) 50 MCG/ACT nasal spray Place 1 spray into the nose  daily as needed. Allergies or sinus congestion.   Yes Historical Provider, MD  furosemide (LASIX) 80 MG tablet Take 80 mg by mouth daily.     Yes Historical Provider, MD  gabapentin (NEURONTIN) 300 MG capsule Take 1 capsule (300 mg total) by mouth 2 (two) times daily. 01/10/13  Yes [name], MD  HYDROcodone-acetaminophen (NORCO/VICODIN) 5-325 MG per tablet Take 1 tablet by mouth every 6  (six) hours as needed.  02/07/13  Yes Historical Provider, MD  insulin detemir (LEVEMIR) 100 UNIT/ML injection Inject 15 Units into the skin at bedtime. 01/10/13  Yes [name], MD  labetalol (NORMODYNE) 300 MG tablet Take 150 mg by mouth 2 (two) times daily.   Yes Historical Provider, MD  Liraglutide (VICTOZA) 18 MG/3ML SOLN injection Inject 1.2 mg into the skin daily.   Yes Historical Provider, MD  metoCLOPramide (REGLAN) 10 MG tablet Take 1 tablet (10 mg total) by mouth daily. 01/10/13  Yes [name], MD  mycophenolate (CELLCEPT) 250 MG capsule Take 500 mg by mouth 2 (two) times daily.    Yes Historical Provider, MD  omeprazole (PRILOSEC) 20 MG capsule Take 20 mg by mouth daily.     Yes Historical Provider, MD  polyethylene glycol powder (GLYCOLAX/MIRALAX) powder Take 17 g by mouth daily as needed (constipation).  01/06/13  Yes Historical Provider, MD  pravastatin (PRAVACHOL) 80 MG tablet Take 80 mg by mouth at bedtime.   Yes Historical Provider, MD  predniSONE (DELTASONE) 5 MG tablet Take 5 mg by mouth daily.   Yes Historical Provider, MD  sulfamethoxazole-trimethoprim (BACTRIM,SEPTRA) 400-80 MG per tablet Take 1 tablet by mouth every Monday, Wednesday, and Friday.   Yes Historical Provider, MD   Physical Exam: Filed Vitals:   03/05/13 1457  BP: 143/71  Pulse: 74  Temp: 97.9 F (36.6 C)  Resp: 18  SpO2: 100%    Constitutional: Vital signs reviewed.  Patient is a well-developed and well-nourished  in no acute distress and cooperative with exam. Alert and oriented x3.  Head: Normocephalic and atraumatic Mouth: no erythema or exudates, MMM Eyes: PERRL, EOMI, conjunctivae normal, No scleral icterus.  Neck: Supple, Trachea midline normal ROM, No JVD, mass, thyromegaly, or carotid bruit present.  Cardiovascular: RRR, S1 normal, S2 normal, no MRG, pulses symmetric and intact bilaterally Pulmonary/Chest: CTAB, no wheezes, rales, or rhonchi Abdominal: Soft. Non-tender,  non-distended, bowel sounds are normal, no masses, organomegaly, or guarding present.  Musculoskeletal: No joint deformities, erythema, or stiffness, ROM full and no nontender Neurological: A&O x3, Strength is normal and symmetric bilaterally, , no focal motor deficit, sensory intact to light touch bilaterally.  Skin: Warm, dry and intact. No rash, cyanosis, or clubbing.  Psychiatric: Normal mood and affect. speech and behavior is normal  Labs on Admission:  Basic Metabolic Panel:  Recent Labs Lab 03/05/13 1534  NA 137  K 5.1  CL 101  CO2 26  GLUCOSE 116*  BUN 37*  CREATININE 2.62*  CALCIUM 9.4   Liver Function Tests:  Recent Labs Lab 03/05/13 1534  AST 21  ALT 7  ALKPHOS 61  BILITOT 0.4  PROT 6.9  ALBUMIN 3.1*   No results found for this basename: LIPASE, AMYLASE,  in the last 168 hours No results found for this basename: AMMONIA,  in the last 168 hours CBC:  Recent Labs Lab 03/05/13 1534  WBC 4.8  NEUTROABS 2.6  HGB 10.1*  HCT 32.1*  MCV 84.9  PLT 247   Cardiac Enzymes: No results found for this  basename: CKTOTAL, CKMB, CKMBINDEX, TROPONINI,  in the last 168 hours  BNP (last 3 results)  Recent Labs  12/01/12 0943  PROBNP 4046.0*   CBG: No results found for this basename: GLUCAP,  in the last 168 hours  Radiological Exams on Admission: Dg Chest 2 View  03/05/2013  *RADIOLOGY REPORT*  Clinical Data: 62 year old male syncope weakness diabetes.  CHEST - 2 VIEW  Comparison: 02/03/2013 and earlier.  Findings: Semi upright AP and lateral views of the chest. Stable cardiomegaly and mediastinal contours.  Stable to mildly improved lung volumes.  The lungs are clear.  No pneumothorax or effusion. No acute osseous abnormality identified.  IMPRESSION: Stable cardiomegaly.  No acute cardiopulmonary abnormality.   Original Report Authenticated By: Erskine Speed, M.D.    Ct Head Wo Contrast  03/05/2013  *RADIOLOGY REPORT*  Clinical Data: Syncope, weakness  CT HEAD  WITHOUT CONTRAST  Technique:  Contiguous axial images were obtained from the base of the skull through the vertex without contrast.  Comparison: 01/10/2013  Findings: No evidence of parenchymal hemorrhage or extra-axial fluid collection. No mass lesion, mass effect, or midline shift.  No CT evidence of acute infarction.  Subcortical white matter and periventricular small vessel ischemic changes.  Intracranial atherosclerosis.  Stable mild frontal lobe atrophy.  Near complete opacification of the left frontal sinus.  Partial opacification of the left ethmoid sinus.  Mild partial opacification of the left mastoid air cells (series 3/image 3).  No evidence of calvarial fracture.  IMPRESSION: No evidence of acute intracranial abnormality.  Atrophy with small vessel ischemic changes and intracranial atherosclerosis.  Paranasal sinus disease, as described above.   Original Report Authenticated By: Charline Bills, M.D.     EKG: pending  Assessment/Plan Active Problems:   Type 2 diabetes mellitus with diabetic nephropathy   DIABETIC PERIPHERAL NEUROPATHY   SLEEP APNEA   History of renal transplant   Knee pain, left anterior   Orthostatic syncope   Acute on chronic renal insufficiency  1. Syncope:  - admit to telemetry for evaluation of arrythmia's - rule out orthostatic, initial CT head neg for acute pathology. No focal deficits. On aspirin. - gentle hydration. - recheck EKG in am.  -  Serial cardiac enzymes. - last echo was in 1/14 showed LVEF of 60% with mod LVH and moderately dilated left atrium.  - cardiology consulted by ED. Will see him in am, if enzymes are abnormal .   2. Acute on CKD Stage 3: probably from over diuresis. Hold lasix, gentle hydration for today and repeat BMP in am. Obtain UA as he has h/o multiple UTI'S.  He also has an appointment with urology next month.   3. Hypertension: well controlled resume home medications.   4. H/o renal transplant: resume immunosuppressants.    5. H/o DM: Resume home medications.  6. Anemia: probably AOCD. Monitor.   Code Status: full code Family Communication: wife at bedside Disposition Plan: pending PT eval.  Time spent: 75 min  Tiwanna Tuch [facility] Pager [phone]  If 7PM-7AM, please contact night-coverage [email or web address] Password [facility] 03/05/2013, 6:11 PM

## 2013-03-05 NOTE — ED Notes (Signed)
Report called to 4th floor. 

## 2013-03-05 NOTE — ED Notes (Signed)
JYN:[id number]<AO> Expected date:03/05/13<BR> Expected time: 2:45 PM<BR> Means of arrival:Ambulance<BR> Comments:<BR> Fatigue

## 2013-03-05 NOTE — ED Notes (Signed)
MD at bedside. 

## 2013-03-05 NOTE — ED Notes (Signed)
Patient states that he has tremors all over body. Tremors started about one month ago. MD drew fluid off of left knee 2X-thought it was gout. Drew fluid off right knee 1X. Suppose to have some type of lap surgery soon. Patient has hx up UTI's and sepsis.

## 2013-03-05 NOTE — ED Provider Notes (Signed)
History     CSN: [id number]  Arrival date & time 03/05/13  1446   First MD Initiated Contact with Patient 03/05/13 1503      Chief Complaint  Patient presents with  . Fatigue    (Consider location/radiation/quality/duration/timing/severity/associated sxs/prior treatment) Patient is a 62 y.o. male presenting with syncope. The history is provided by the patient.  Loss of Consciousness  This is a new problem. Episode onset: over the last month. The problem occurs every several days. The problem has been gradually worsening. He lost consciousness for a period of less than one minute. Associated with: most of the times it occurs with position change but wed it happened while he was just sitting. Associated symptoms include light-headedness and weakness. Pertinent negatives include abdominal pain, chest pain, confusion, diaphoresis, dizziness, fever, focal sensory loss, focal weakness, nausea, palpitations, seizures, slurred speech and vomiting. He has tried nothing for the symptoms. The treatment provided no relief. His past medical history is significant for DM and HTN. His past medical history does not include CAD or CVA.    Past Medical History  Diagnosis Date  . Diabetes mellitus   . Hypertension   . Kidney transplant as cause of abnormal reaction or later complication     In [name]-Dr. Colodonato-Dr. Lawerance Cruel is the Transplant dodctor at [facility]  . Hyperlipidemia   . Chronic kidney disease   . Neuromuscular disorder     Past Surgical History  Procedure Laterality Date  . Kidney transplant      Family History  Problem Relation Age of Onset  . Diabetes Mother   . Diabetes Father   . Kidney failure Brother   . Heart failure Sister     History  Substance Use Topics  . Smoking status: Former Games developer  . Smokeless tobacco: Never Used  . Alcohol Use: No      Review of Systems  Constitutional: Negative for fever and diaphoresis.  Cardiovascular: Positive for syncope.  Negative for chest pain and palpitations.  Gastrointestinal: Negative for nausea, vomiting and abdominal pain.  Neurological: Positive for weakness and light-headedness. Negative for dizziness, focal weakness and seizures.  Psychiatric/Behavioral: Negative for confusion.  All other systems reviewed and are negative.    Allergies  Iodine and Shellfish allergy  Home Medications   Current Outpatient Rx  Name  Route  Sig  Dispense  Refill  . acetaminophen (ARTHRITIS PAIN RELIEF) 650 MG CR tablet   Oral   Take 650-1,300 mg by mouth 2 (two) times daily as needed (arthritis).          Marland Kitchen aspirin EC 81 MG tablet   Oral   Take 81 mg by mouth daily.         . capsaicin (ZOSTRIX) 0.025 % cream   Topical   Apply 1 application topically 4 (four) times daily - after meals and at bedtime.         . cycloSPORINE modified (NEORAL) 100 MG capsule   Oral   Take 100 mg by mouth 2 (two) times daily. In addition to 50mg  twice daily for total 150 mg twice daily.         . cycloSPORINE modified (NEORAL) 25 MG capsule   Oral   Take 50 mg by mouth 2 (two) times daily. In addition to 100mg  po twice daily for total dose of 150mg  twice daily.         Marland Kitchen docusate sodium (COLACE) 100 MG capsule   Oral   Take 100 mg by  mouth daily as needed for constipation.         . fexofenadine (ALLEGRA) 180 MG tablet   Oral   Take 180 mg by mouth daily as needed. For allergies         . fluticasone (FLONASE) 50 MCG/ACT nasal spray   Nasal   Place 1 spray into the nose daily as needed. Allergies or sinus congestion.         . furosemide (LASIX) 80 MG tablet   Oral   Take 80 mg by mouth daily.           Marland Kitchen gabapentin (NEURONTIN) 300 MG capsule   Oral   Take 1 capsule (300 mg total) by mouth 2 (two) times daily.   180 capsule   3   . HYDROcodone-acetaminophen (NORCO/VICODIN) 5-325 MG per tablet   Oral   Take 1 tablet by mouth every 6 (six) hours as needed.          . insulin detemir  (LEVEMIR) 100 UNIT/ML injection   Subcutaneous   Inject 15 Units into the skin at bedtime.         Marland Kitchen labetalol (NORMODYNE) 300 MG tablet   Oral   Take 150 mg by mouth 2 (two) times daily.         . Liraglutide (VICTOZA) 18 MG/3ML SOLN injection   Subcutaneous   Inject 1.2 mg into the skin daily.         . metoCLOPramide (REGLAN) 10 MG tablet   Oral   Take 1 tablet (10 mg total) by mouth daily.   90 tablet   3   . mycophenolate (CELLCEPT) 250 MG capsule   Oral   Take 500 mg by mouth 2 (two) times daily.          Marland Kitchen omeprazole (PRILOSEC) 20 MG capsule   Oral   Take 20 mg by mouth daily.           . polyethylene glycol powder (GLYCOLAX/MIRALAX) powder   Oral   Take 17 g by mouth daily as needed (constipation).          . pravastatin (PRAVACHOL) 80 MG tablet   Oral   Take 80 mg by mouth at bedtime.         . predniSONE (DELTASONE) 5 MG tablet   Oral   Take 5 mg by mouth daily.         Marland Kitchen sulfamethoxazole-trimethoprim (BACTRIM,SEPTRA) 400-80 MG per tablet   Oral   Take 1 tablet by mouth every Monday, Wednesday, and Friday.           BP 143/71  Pulse 74  Temp(Src) 97.9 F (36.6 C)  Resp 18  SpO2 100%  Physical Exam  Nursing note and vitals reviewed. Constitutional: He is oriented to person, place, and time. He appears well-developed and well-nourished. No distress.  HENT:  Head: Normocephalic and atraumatic.  Mouth/Throat: Oropharynx is clear and moist.  Eyes: Conjunctivae and EOM are normal. Pupils are equal, round, and reactive to light.  Neck: Normal range of motion. Neck supple.  Cardiovascular: Normal rate, regular rhythm and intact distal pulses.   No murmur heard. Pulmonary/Chest: Effort normal and breath sounds normal. No respiratory distress. He has no wheezes. He has no rales.  Abdominal: Soft. He exhibits no distension. There is no tenderness. There is no rebound and no guarding.  Musculoskeletal: Normal range of motion. He exhibits  no edema and no tenderness.  Neurological: He is alert and oriented to  person, place, and time.  Skin: Skin is warm and dry. No rash noted. No erythema.  Psychiatric: He has a normal mood and affect. His behavior is normal.    ED Course  Procedures (including critical care time)  Labs Reviewed  CBC WITH DIFFERENTIAL - Abnormal; Notable for the following:    RBC 3.78 (*)    Hemoglobin 10.1 (*)    HCT 32.1 (*)    Monocytes Relative 21 (*)    All other components within normal limits  COMPREHENSIVE METABOLIC PANEL - Abnormal; Notable for the following:    Glucose, Bld 116 (*)    BUN 37 (*)    Creatinine, Ser 2.62 (*)    Albumin 3.1 (*)    GFR calc non Af Amer 24 (*)    GFR calc Af Amer 28 (*)    All other components within normal limits  URINALYSIS, ROUTINE W REFLEX MICROSCOPIC  POCT I-STAT TROPONIN I   Dg Chest 2 View  03/05/2013  *RADIOLOGY REPORT*  Clinical Data: 61 year old male syncope weakness diabetes.  CHEST - 2 VIEW  Comparison: 02/03/2013 and earlier.  Findings: Semi upright AP and lateral views of the chest. Stable cardiomegaly and mediastinal contours.  Stable to mildly improved lung volumes.  The lungs are clear.  No pneumothorax or effusion. No acute osseous abnormality identified.  IMPRESSION: Stable cardiomegaly.  No acute cardiopulmonary abnormality.   Original Report Authenticated By: Erskine Speed, M.D.    Ct Head Wo Contrast  03/05/2013  *RADIOLOGY REPORT*  Clinical Data: Syncope, weakness  CT HEAD WITHOUT CONTRAST  Technique:  Contiguous axial images were obtained from the base of the skull through the vertex without contrast.  Comparison: 01/10/2013  Findings: No evidence of parenchymal hemorrhage or extra-axial fluid collection. No mass lesion, mass effect, or midline shift.  No CT evidence of acute infarction.  Subcortical white matter and periventricular small vessel ischemic changes.  Intracranial atherosclerosis.  Stable mild frontal lobe atrophy.  Near complete  opacification of the left frontal sinus.  Partial opacification of the left ethmoid sinus.  Mild partial opacification of the left mastoid air cells (series 3/image 3).  No evidence of calvarial fracture.  IMPRESSION: No evidence of acute intracranial abnormality.  Atrophy with small vessel ischemic changes and intracranial atherosclerosis.  Paranasal sinus disease, as described above.   Original Report Authenticated By: Charline Bills, M.D.      Date: 03/05/2013  Rate: 69  Rhythm: normal sinus rhythm  QRS Axis: normal  Intervals: normal  ST/T Wave abnormalities: ST depressions inferiorly and 1mm ST elevation in lateral leads  Conduction Disutrbances:none  Narrative Interpretation:   Old EKG Reviewed: changes noted   No diagnosis found.    MDM   Pt with mmp including renal transplant on antirejection meds that were recent changed 2 weeks ago.  Within the last month pt has had ARF, UTI and sepsis however present today due to not feeling well and multiple episodes of syncope in the last month most recently on wed.  Pt denies CP, SOB, palpitations before episodes but sometimes there is warning and other times he just syncopizes.  Pt states he is feeling generally weak but no focal weakness.  No infectious sx today and passing large amts of urine. No focal findings on exam and normal VS. No prior hx of cardiac issues or syncope work ups in the past.  CBC, CMP, UA, troponin, CXR, head CT, EKG pending.  4:25 PM CBC with stable Hb and troponin neg  however concerning sx on EKG that are new since march 14th with ST depression inferiorly and 1mm of elevation in lateral leads.  Pt currently has no CP or SOB.  Also CMP with elevated Cr again at 2.6 but normal K.  CXR and Head CT without acute findings.  Will discuss case and EKG with cardiology for consult.  Will admit to medicine for further care of acute renal insufficiency.  5:34 PM Spoke with Dr. Shirlee Latch who will follow the pt but due to Cr  will not do any acute interventions but recommend tele and cycling enzymes.  Pt currently states he does not have to urinate and attempted to cath but foley would not advance.  Pt has been urinating at home regularly.  Pt given of fluid.     Gwyneth Sprout, MD 03/05/13 1737

## 2013-03-05 NOTE — Progress Notes (Signed)
Rx Brief Lovenox note Pt wt= 116 kg BMI=33.9 CrCl~39 ml/min Adjust Lovenox to 60mg  daily in pt with BMI>30 and CrCl>30  [name] 03/05/2013 7:24 PM

## 2013-03-05 NOTE — ED Notes (Signed)
In and out performed- unable to advance catheter

## 2013-03-05 NOTE — ED Notes (Signed)
Nurse Marissa updated that phlebotomy unable to draw labs in ED.  Will need lab to get them upstairs.

## 2013-03-05 NOTE — ED Notes (Signed)
Per EMS patient feeling weak for about 1 week unspecified cause. Pt has hx of renal transplant. Patient has had pain in left leg which is unrelated.

## 2013-03-06 ENCOUNTER — Inpatient Hospital Stay (HOSPITAL_COMMUNITY): Payer: Medicare HMO

## 2013-03-06 ENCOUNTER — Encounter (HOSPITAL_COMMUNITY): Payer: Self-pay | Admitting: Cardiology

## 2013-03-06 DIAGNOSIS — E1043 Type 1 diabetes mellitus with diabetic autonomic (poly)neuropathy: Secondary | ICD-10-CM | POA: Insufficient documentation

## 2013-03-06 DIAGNOSIS — E669 Obesity, unspecified: Secondary | ICD-10-CM | POA: Insufficient documentation

## 2013-03-06 DIAGNOSIS — I119 Hypertensive heart disease without heart failure: Secondary | ICD-10-CM | POA: Insufficient documentation

## 2013-03-06 DIAGNOSIS — R259 Unspecified abnormal involuntary movements: Secondary | ICD-10-CM

## 2013-03-06 DIAGNOSIS — E785 Hyperlipidemia, unspecified: Secondary | ICD-10-CM | POA: Insufficient documentation

## 2013-03-06 DIAGNOSIS — Z940 Kidney transplant status: Secondary | ICD-10-CM

## 2013-03-06 DIAGNOSIS — E1129 Type 2 diabetes mellitus with other diabetic kidney complication: Secondary | ICD-10-CM | POA: Diagnosis present

## 2013-03-06 DIAGNOSIS — E13319 Other specified diabetes mellitus with unspecified diabetic retinopathy without macular edema: Secondary | ICD-10-CM | POA: Insufficient documentation

## 2013-03-06 HISTORY — DX: Type 1 diabetes mellitus with diabetic autonomic (poly)neuropathy: E10.43

## 2013-03-06 LAB — BASIC METABOLIC PANEL
BUN: 33 mg/dL — ABNORMAL HIGH (ref 6–23)
CO2: 23 mEq/L (ref 19–32)
Chloride: 107 mEq/L (ref 96–112)
Creatinine, Ser: 2.3 mg/dL — ABNORMAL HIGH (ref 0.50–1.35)
Potassium: 4.2 mEq/L (ref 3.5–5.1)

## 2013-03-06 LAB — CBC
HCT: 30.4 % — ABNORMAL LOW (ref 39.0–52.0)
Hemoglobin: 9.6 g/dL — ABNORMAL LOW (ref 13.0–17.0)
MCHC: 31.6 g/dL (ref 30.0–36.0)
MCV: 84.4 fL (ref 78.0–100.0)
RDW: 14 % (ref 11.5–15.5)

## 2013-03-06 LAB — FREE PSA
PSA, Free Pct: 33 % (ref >25)
PSA, Free: 0.9 ng/mL

## 2013-03-06 LAB — TROPONIN I: Troponin I: <0.3 ng/mL (ref <0.30)

## 2013-03-06 LAB — GLUCOSE, CAPILLARY: Glucose-Capillary: 75 mg/dL (ref 70–99)

## 2013-03-06 MED ORDER — PRAVASTATIN SODIUM 20 MG PO TABS
20.0000 mg | ORAL_TABLET | Freq: Every day | ORAL | Status: DC
  Administered 2013-03-06 – 2013-03-08 (×3): 20 mg via ORAL
  Filled 2013-03-06 (×3): qty 1

## 2013-03-06 MED ORDER — SODIUM CHLORIDE 0.9 % IV SOLN
INTRAVENOUS | Status: DC
  Administered 2013-03-06 – 2013-03-07 (×2): via INTRAVENOUS

## 2013-03-06 NOTE — Consult Note (Signed)
[name] 03/06/2013 [name] Requesting Physician:  Dr [name]  Reason for Consult:  ESRD patient w kidney transplant and syncope HPI: The patient is a 62 y.o. year-old with hx of long-standing DM, HTN and ESRD. Has gastroparesis on Reglan for years, takes CyA, Cellcept and pred for transplant (LRD from niece in 2004). Tx function stable with baseline creatinine 1.7-2.3 it appears from [facility] records today. Admitted with syncopal episode. Has had 3 admissions since Jan 2014 for enterobacter UTI, once with assoc bacteremia. Had one admission for syncope and norvasc and flomax were stopped (in Feb 2014).  He denies urinary symptoms. He had been shaking a lot the past few days, but that has resolved in the hospital.  He gets lightheaded with standing.  Has been on lasix for many years, currently 80 mg/Rowland, was on higher doses prior.   Creatinine was 2.6 on admission, down to 2.3 today. Getting IVF"s     ROS  no nsaids  no dysuria  no cloudy urine  no abd pain  no sob or CP  no confusion   Past Medical History:  Past Medical History  Diagnosis Date  . Diabetes mellitus with renal manifestations, controlled   . Hypertensive heart disease   . Kidney transplant as cause of abnormal reaction or later complication     In [name]-Dr. Colodonato-Dr. Lawerance Cruel is the Transplant dodctor at [facility]  . Hyperlipidemia   . Chronic kidney disease   . Neuromuscular disorder     Past Surgical History:  Past Surgical History  Procedure Laterality Date  . Kidney transplant      Family History:  Family History  Problem Relation Age of Onset  . Diabetes Mother   . Diabetes Father   . Kidney failure Brother   . Heart failure Sister    Social History:  reports that he has quit smoking. He has never used smokeless tobacco. He reports that he does not drink alcohol or use illicit drugs.  Allergies:  Allergies  Allergen Reactions  . Iodine Anaphylaxis    Iv dye  . Shellfish  Allergy Anaphylaxis and Swelling    Has throat swelling, tongue swelling, and entire body swells up.     Home medications: Prior to Admission medications   Medication Sig Start Date End Date Taking? Authorizing Provider  acetaminophen (ARTHRITIS PAIN RELIEF) 650 MG CR tablet Take 650-1,300 mg by mouth 2 (two) times daily as needed (arthritis).    Yes Historical Provider, MD  aspirin EC 81 MG tablet Take 81 mg by mouth daily.   Yes Historical Provider, MD  capsaicin (ZOSTRIX) 0.025 % cream Apply 1 application topically 4 (four) times daily - after meals and at bedtime.   Yes Historical Provider, MD  cycloSPORINE modified (NEORAL) 100 MG capsule Take 100 mg by mouth 2 (two) times daily. In addition to 50mg  twice daily for total 150 mg twice daily.   Yes Historical Provider, MD  cycloSPORINE modified (NEORAL) 25 MG capsule Take 50 mg by mouth 2 (two) times daily. In addition to 100mg  po twice daily for total dose of 150mg  twice daily.   Yes Historical Provider, MD  docusate sodium (COLACE) 100 MG capsule Take 100 mg by mouth daily as needed for constipation.   Yes Historical Provider, MD  fexofenadine (ALLEGRA) 180 MG tablet Take 180 mg by mouth daily as needed. For allergies   Yes Historical Provider, MD  fluticasone (FLONASE) 50 MCG/ACT nasal spray Place 1 spray into the nose daily as  needed. Allergies or sinus congestion.   Yes Historical Provider, MD  furosemide (LASIX) 80 MG tablet Take 80 mg by mouth daily.     Yes Historical Provider, MD  gabapentin (NEURONTIN) 300 MG capsule Take 1 capsule (300 mg total) by mouth 2 (two) times daily. 01/10/13  Yes [name], MD  HYDROcodone-acetaminophen (NORCO/VICODIN) 5-325 MG per tablet Take 1 tablet by mouth every 6 (six) hours as needed.  02/07/13  Yes Historical Provider, MD  insulin detemir (LEVEMIR) 100 UNIT/ML injection Inject 15 Units into the skin at bedtime. 01/10/13  Yes [name], MD  labetalol (NORMODYNE) 300 MG tablet Take 150 mg by  mouth 2 (two) times daily.   Yes Historical Provider, MD  Liraglutide (VICTOZA) 18 MG/3ML SOLN injection Inject 1.2 mg into the skin daily.   Yes Historical Provider, MD  metoCLOPramide (REGLAN) 10 MG tablet Take 1 tablet (10 mg total) by mouth daily. 01/10/13  Yes [name], MD  mycophenolate (CELLCEPT) 250 MG capsule Take 500 mg by mouth 2 (two) times daily.    Yes Historical Provider, MD  omeprazole (PRILOSEC) 20 MG capsule Take 20 mg by mouth daily.     Yes Historical Provider, MD  polyethylene glycol powder (GLYCOLAX/MIRALAX) powder Take 17 g by mouth daily as needed (constipation).  01/06/13  Yes Historical Provider, MD  pravastatin (PRAVACHOL) 80 MG tablet Take 80 mg by mouth at bedtime.   Yes Historical Provider, MD  predniSONE (DELTASONE) 5 MG tablet Take 5 mg by mouth daily.   Yes Historical Provider, MD  sulfamethoxazole-trimethoprim (BACTRIM,SEPTRA) 400-80 MG per tablet Take 1 tablet by mouth every Monday, Wednesday, and Friday.   Yes Historical Provider, MD    Labs: Basic Metabolic Panel:  Recent Labs Lab 03/05/13 1534 03/06/13 0647  NA 137 140  K 5.1 4.2  CL 101 107  CO2 26 23  GLUCOSE 116* 73  BUN 37* 33*  CREATININE 2.62* 2.30*  CALCIUM 9.4 8.7   Liver Function Tests:  Recent Labs Lab 03/05/13 1534  AST 21  ALT 7  ALKPHOS 61  BILITOT 0.4  PROT 6.9  ALBUMIN 3.1*   No results found for this basename: LIPASE, AMYLASE,  in the last 168 hours No results found for this basename: AMMONIA,  in the last 168 hours CBC:  Recent Labs Lab 03/05/13 1534 03/06/13 0647  WBC 4.8 4.2  NEUTROABS 2.6  --   HGB 10.1* 9.6*  HCT 32.1* 30.4*  MCV 84.9 84.4  PLT 247 215   PT/INR: @LABRCNTIP (inr:5) Cardiac Enzymes: ) Recent Labs Lab 03/05/13 1824 03/06/13 0100 03/06/13 0647  TROPONINI <0.30 <0.30 <0.30   CBG:  Recent Labs Lab 03/06/13 0727 03/06/13 1158  GLUCAP 75 79     Physical Exam:  Blood pressure 132/65, pulse 91, temperature 98.1 F (36.7  C), temperature source Oral, resp. rate 20, height 6\' 1"  (1.854 m), weight 116.574 kg (257 lb), SpO2 100.00%.  Gen: alert, in the chair, calm and not tremulou Position Time  BP  HR Sitting   0  148/67  79 Standing  1 min  124/62  93 Standing 2 min  123/62  99  Skin: no rash, cyanosis HEENT:  EOMI, sclera anicteric, throat moist Neck: no JVD, no LAN Chest: clear bilat no rales CV: regular, no rub or gallop, no carotid or femoral bruits, pedal pulses intact Abdomen: soft, obese, nontender, no HSM, no ascites Ext: trace ankle edema bilat, no joint effusion or deformity, no gangrene or ulceration Neuro: alert, Ox3,  no focal deficit   Impression/Plan 1. Syncope, recurrent- pt is orthostatic today, even after IVF"s last night, and feels much better after IVF's. Suspect vol depletion contributing to problems, he takes chronic lasix 80/Rowland for years.  Would push fluids, increase IVF"s and hydrate patient. Stop daily diuretic dose for now. Other possibilities are autonomic dysfunction, heart disease (echo pending), other.  2. Kidney transplant, CKD stage IIIb- cont meds with CyA, Cellcept, pred . Creat down today towards baseline  3. HTN- on labetalol 150 bid, continue. Stop lasix 4. Tremors- could be medication side effect (CyA, Reglan), but less likely since it has resolved and dosing has not been changed. May have been due to vol depletion.  5. DM, longstanding 6. Recurrent UTI's- repeat cx in progress. UA with 7-10 wbc, 0-2 rbc. No fever  Will follow.   Vinson Moselle  MD [facility] [phone]     [phone] cell 03/06/2013, 3:46 PM

## 2013-03-06 NOTE — Progress Notes (Addendum)
PATIENT DETAILS Name: [name] Age: 62 y.o. Sex: male Date of Birth: 01-10-1951 Admit Date: 03/05/2013 Admitting Physician Kathlen Mody, MD PCP:No primary provider on file.  Subjective: Admitted with a syncopal episode that occurred on 4/16-but has been feeling "weak" ever since. Claims to have intermittent tremors  Assessment/Plan: Principal Problem:   Syncope -has had previous episode of orthostatic hypotension-suspect this particular syncopal epsiode is from this mechanism as well. -check orthostatics today -gently hydrate-may need to decrease dosing of lasix -does have new EKG changes in inferior leads concerning for ischemia-but not sure if this is the reason for syncope -getting cards eval  Active Problems: Abnormal EKG with new inferolateral T -new inferolateral T-wave changes since previous EKG -appreciate cards eval -Echo done just a few months ago showed no wall motion abnormality in the abovementioned area -Nuclear stress test in a m -doubt we can offer LHC given CKD-but defer this to cards  Acute on CKD stage 4 -holding lasix -gently hydrate  Gen weakness/tremors  -suspect that this maybe just related to being dry and orthostatic -but given immunocompromised state-will check MRI brain-make sure he has no obvious changes/subtle infection etc -if tremors persists may need to stop Reglan/Cyclosporine and see if this gets better-will touch base with nephrology to see if these could be related to transplant meds  Renal transplant-with underlying stage 4 CKD -c.w Prednisone,cyclosporine,cellcept  HTN -moderate control -c/w Labetolol  DM -CBG's controlled -c/w Levemir and Victoza -last A1C-7.4 on 2/16  Gastroparesis -on Reglan -seems stable at present-no vomiting  Peripheral Neuropathy -on Neurontin  Dyslipidemia -c/w Statin  Disposition: Remain inpatient  DVT Prophylaxis: Prophylactic Lovenox   Code Status: Full code   Family  Communication No family at bedside  Procedures:  None  CONSULTS:  cardiology  PHYSICAL EXAM: Vital signs in last 24 hours: Filed Vitals:   03/05/13 1457 03/05/13 1901 03/05/13 1919 03/06/13 0551  BP: 143/71 158/79  161/78  Pulse: 74 78  85  Temp: 97.9 F (36.6 C) 98 F (36.7 C)  98.2 F (36.8 C)  TempSrc:  Oral  Oral  Resp: 18   20  Height:  6\' 1"  (1.854 m) 6\' 1"  (1.854 m)   Weight:   116.574 kg (257 lb)   SpO2: 100% 100%  100%    Weight change:  Filed Weights   03/05/13 1919  Weight: 116.574 kg (257 lb)   Body mass index is 33.91 kg/(m^2).   Gen Exam: Awake and alert with clear speech.   Neck: Supple, No JVD.   Chest: B/L Clear.   CVS: S1 S2 Regular, no murmurs.  Abdomen: soft, BS +, non tender, non distended.  Extremities: trace edema, lower extremities warm to touch. Neurologic: Non Focal.   Skin: No Rash.   Wounds: N/A.    Intake/Output from previous day:  Intake/Output Summary (Last 24 hours) at 03/06/13 1012 Last data filed at 03/06/13 0919  Gross per 24 hour  Intake   1550 ml  Output    650 ml  Net    900 ml     LAB RESULTS: CBC  Recent Labs Lab 03/05/13 1534 03/06/13 0647  WBC 4.8 4.2  HGB 10.1* 9.6*  HCT 32.1* 30.4*  PLT 247 215  MCV 84.9 84.4  MCH 26.7 26.7  MCHC 31.5 31.6  RDW 14.2 14.0  LYMPHSABS 1.1  --   MONOABS 1.0  --   EOSABS 0.1  --   BASOSABS 0.0  --     Chemistries  Recent Labs Lab 03/05/13 1534 03/06/13 0647  NA 137 140  K 5.1 4.2  CL 101 107  CO2 26 23  GLUCOSE 116* 73  BUN 37* 33*  CREATININE 2.62* 2.30*  CALCIUM 9.4 8.7  MG  --  1.7    CBG:  Recent Labs Lab 03/06/13 0727  GLUCAP 75    GFR Estimated Creatinine Clearance: 44 ml/min (by C-G formula based on Cr of 2.3).  Coagulation profile No results found for this basename: INR, PROTIME,  in the last 168 hours  Cardiac Enzymes  Recent Labs Lab 03/05/13 1824 03/06/13 0100 03/06/13 0647  TROPONINI <0.30 <0.30 <0.30    No  components found with this basename: POCBNP,  No results found for this basename: DDIMER,  in the last 72 hours No results found for this basename: HGBA1C,  in the last 72 hours No results found for this basename: CHOL, HDL, LDLCALC, TRIG, CHOLHDL, LDLDIRECT,  in the last 72 hours No results found for this basename: TSH, T4TOTAL, FREET3, T3FREE, THYROIDAB,  in the last 72 hours No results found for this basename: VITAMINB12, FOLATE, FERRITIN, TIBC, IRON, RETICCTPCT,  in the last 72 hours No results found for this basename: LIPASE, AMYLASE,  in the last 72 hours  Urine Studies No results found for this basename: UACOL, UAPR, USPG, UPH, UTP, UGL, UKET, UBIL, UHGB, UNIT, UROB, ULEU, UEPI, UWBC, URBC, UBAC, CAST, CRYS, UCOM, BILUA,  in the last 72 hours  MICROBIOLOGY: No results found for this or any previous visit (from the past 240 hour(s)).  RADIOLOGY STUDIES/RESULTS: Dg Chest 2 View  03/05/2013  *RADIOLOGY REPORT*  Clinical Data: 62 year old male syncope weakness diabetes.  CHEST - 2 VIEW  Comparison: 02/03/2013 and earlier.  Findings: Semi upright AP and lateral views of the chest. Stable cardiomegaly and mediastinal contours.  Stable to mildly improved lung volumes.  The lungs are clear.  No pneumothorax or effusion. No acute osseous abnormality identified.  IMPRESSION: Stable cardiomegaly.  No acute cardiopulmonary abnormality.   Original Report Authenticated By: Erskine Speed, M.D.    Ct Head Wo Contrast  03/05/2013  *RADIOLOGY REPORT*  Clinical Data: Syncope, weakness  CT HEAD WITHOUT CONTRAST  Technique:  Contiguous axial images were obtained from the base of the skull through the vertex without contrast.  Comparison: 01/10/2013  Findings: No evidence of parenchymal hemorrhage or extra-axial fluid collection. No mass lesion, mass effect, or midline shift.  No CT evidence of acute infarction.  Subcortical white matter and periventricular small vessel ischemic changes.  Intracranial  atherosclerosis.  Stable mild frontal lobe atrophy.  Near complete opacification of the left frontal sinus.  Partial opacification of the left ethmoid sinus.  Mild partial opacification of the left mastoid air cells (series 3/image 3).  No evidence of calvarial fracture.  IMPRESSION: No evidence of acute intracranial abnormality.  Atrophy with small vessel ischemic changes and intracranial atherosclerosis.  Paranasal sinus disease, as described above.   Original Report Authenticated By: Charline Bills, M.D.     MEDICATIONS: Scheduled Meds: . aspirin EC  81 mg Oral Daily  . capsaicin  1 application Topical TID PC & HS  . cycloSPORINE modified  100 mg Oral BID  . cycloSPORINE modified  50 mg Oral BID  . enoxaparin (LOVENOX) injection  60 mg Subcutaneous Q24H  . gabapentin  300 mg Oral BID  . insulin detemir  15 Units Subcutaneous QHS  . labetalol  150 mg Oral BID  . Liraglutide  1.2 mg Subcutaneous Daily  .  loratadine  10 mg Oral Daily  . metoCLOPramide  10 mg Oral Daily  . mycophenolate  500 mg Oral BID  . ondansetron  4 mg Intravenous Once  . pantoprazole  40 mg Oral Daily  . pravastatin  20 mg Oral QPC supper  . predniSONE  5 mg Oral Daily  . sodium chloride  3 mL Intravenous Q12H  . [START ON 03/07/2013] sulfamethoxazole-trimethoprim  1 tablet Oral Q M,W,F   Continuous Infusions: . sodium chloride 75 mL (03/05/13 1940)   PRN Meds:.acetaminophen, docusate sodium, fluticasone, HYDROcodone-acetaminophen, ondansetron (ZOFRAN) IV, ondansetron, polyethylene glycol  Antibiotics: Anti-infectives   Start     Dose/Rate Route Frequency Ordered Stop   03/07/13 1000  sulfamethoxazole-trimethoprim (BACTRIM,SEPTRA) 400-80 MG per tablet 1 tablet     1 tablet Oral Every M-W-F 03/05/13 1901         Jeoffrey Massed, MD  [facility] Pager:[phone]  If 7PM-7AM, please contact night-coverage [email or web address] Password [email or web address] 03/06/2013, 10:12 AM   LOS: 1 day

## 2013-03-06 NOTE — Consult Note (Addendum)
Cardiology Consult Note  Admit date: 03/05/2013 Name: [name] 62 y.o.  male DOB:  Sep 21, 1951 MRN:  [id number]  Today's date:  03/06/2013  Referring Physician:   [facility]  Primary Physician:  Dr. Foye Spurling  Reason for Consultation:   Syncope, abnormal EKG  IMPRESSIONS: 1. Syncopal episode which by description sounds orthostatic in nature . He has had recurrent episodes of this and has been orthostatic in the past. He has significant diabetic neuropathy and there is a high likelihood that he has an autonomic neuropathy due to diabetes 2. Abnormal EKG with new inferolateral T-wave changes since previous EKG 3. Insulin-dependent diabetes mellitus with complications of nephropathy, retinopathy, neuropathy and gastroparesis 4. Hypertensive heart disease 5. Previous renal transplantation with stage IV kidney disease following this 6. Hyperlipidemia 7. History of recurrent urosepsis and UTI 8. Peripheral neuropathy 9. Tremor  10. Obstructive sleep apnea 11. Obesity  RECOMMENDATION: 1. Check orthostatic blood pressures-he will need to have venous support stockings and careful attention to volume status 2. Lexiscan Cardiolite study because of the new changes on EKG. He very well could have silent ischemia but doubt this was the cause of syncope as he was likely orthostatic 3. Repeat echocardiogram 4. Continue monitoring for arrhythmias on telemetry  HISTORY: This 62 year old male has a complicated medical history. He has long-standing insulin-dependent diabetes with complications of end-stage renal disease resulting in transplantation in 2004. He also has complications of neuropathy retinopathy and gastroparesis. He has a history of diastolic heart failure in the past but has not seen a cardiologist for years. He has had recurrent episodes of orthostasis as well as dizziness and near syncope or syncope. His current episode started when he had just gotten up from bed  and had was standing to use the bathroom and sat down the side of the bed. He then had a syncopal episode and fell striking his head while sitting at the bed. He did not complain of palpitations and did not have nausea prior to the episode. He was brought to the emergency room where he was found to have new inferolateral T wave inversions but his troponins have since been negative. He has had recurrent episodes this year of recurrent bacteremia and urosepsis and has been seen by the transplant specialist at [facility]. He is supposed to see a urologist but has not seen one recently. In addition he has complained of worsening tremor to the point where he has had difficulty doing his insulin injections or holding things. There has been some suggestion that this might be related to Reglan. In addition he has been diagnosed with gastroparesis and has had previous nausea and vomiting. He has never had chest or church suggestive of angina. He denies PND, orthopnea. He does have some mild edema.  Past Medical History  Diagnosis Date  . Diabetes mellitus with renal manifestations, controlled   . Hypertensive heart disease   . Kidney transplant as cause of abnormal reaction or later complication     In [name]-Dr. Colodonato-Dr. Lawerance Cruel is the Transplant dodctor at [facility]  . Hyperlipidemia   . Chronic kidney disease   . Neuromuscular disorder       Past Surgical History  Procedure Laterality Date  . Kidney transplant       Allergies:  is allergic to iodine and shellfish allergy.   Medications: Prior to Admission medications   Medication Sig Start Date End Date Taking? Authorizing Provider  acetaminophen (ARTHRITIS PAIN RELIEF) 650 MG CR tablet Take  650-1,300 mg by mouth 2 (two) times daily as needed (arthritis).    Yes Historical Provider, MD  aspirin EC 81 MG tablet Take 81 mg by mouth daily.   Yes Historical Provider, MD  capsaicin (ZOSTRIX) 0.025 % cream Apply 1 application topically 4 (four)  times daily - after meals and at bedtime.   Yes Historical Provider, MD  cycloSPORINE modified (NEORAL) 100 MG capsule Take 100 mg by mouth 2 (two) times daily. In addition to 50mg  twice daily for total 150 mg twice daily.   Yes Historical Provider, MD  cycloSPORINE modified (NEORAL) 25 MG capsule Take 50 mg by mouth 2 (two) times daily. In addition to 100mg  po twice daily for total dose of 150mg  twice daily.   Yes Historical Provider, MD  docusate sodium (COLACE) 100 MG capsule Take 100 mg by mouth daily as needed for constipation.   Yes Historical Provider, MD  fexofenadine (ALLEGRA) 180 MG tablet Take 180 mg by mouth daily as needed. For allergies   Yes Historical Provider, MD  fluticasone (FLONASE) 50 MCG/ACT nasal spray Place 1 spray into the nose daily as needed. Allergies or sinus congestion.   Yes Historical Provider, MD  furosemide (LASIX) 80 MG tablet Take 80 mg by mouth daily.     Yes Historical Provider, MD  gabapentin (NEURONTIN) 300 MG capsule Take 1 capsule (300 mg total) by mouth 2 (two) times daily. 01/10/13  Yes [name], MD  HYDROcodone-acetaminophen (NORCO/VICODIN) 5-325 MG per tablet Take 1 tablet by mouth every 6 (six) hours as needed.  02/07/13  Yes Historical Provider, MD  insulin detemir (LEVEMIR) 100 UNIT/ML injection Inject 15 Units into the skin at bedtime. 01/10/13  Yes [name], MD  labetalol (NORMODYNE) 300 MG tablet Take 150 mg by mouth 2 (two) times daily.   Yes Historical Provider, MD  Liraglutide (VICTOZA) 18 MG/3ML SOLN injection Inject 1.2 mg into the skin daily.   Yes Historical Provider, MD  metoCLOPramide (REGLAN) 10 MG tablet Take 1 tablet (10 mg total) by mouth daily. 01/10/13  Yes [name], MD  mycophenolate (CELLCEPT) 250 MG capsule Take 500 mg by mouth 2 (two) times daily.    Yes Historical Provider, MD  omeprazole (PRILOSEC) 20 MG capsule Take 20 mg by mouth daily.     Yes Historical Provider, MD  polyethylene glycol powder  (GLYCOLAX/MIRALAX) powder Take 17 g by mouth daily as needed (constipation).  01/06/13  Yes Historical Provider, MD  pravastatin (PRAVACHOL) 80 MG tablet Take 80 mg by mouth at bedtime.   Yes Historical Provider, MD  predniSONE (DELTASONE) 5 MG tablet Take 5 mg by mouth daily.   Yes Historical Provider, MD  sulfamethoxazole-trimethoprim (BACTRIM,SEPTRA) 400-80 MG per tablet Take 1 tablet by mouth every Monday, Wednesday, and Friday.   Yes Historical Provider, MD    Family History: Family Status  Relation Status Death Age  . Father Deceased 69    died of MI  . Mother Deceased 67    died of comps of diabetes  . Sister Deceased     died of comps of diabetes    Social History:   reports that he has quit smoking. He has never used smokeless tobacco. He reports that he does not drink alcohol or use illicit drugs.   History   Social History Narrative   Retired and on disability-since 1999 for Renal failure   Is originally from [place] for 38 yrs-came back here in 2002   Use dto smoke and drink only  until age of 30 or 45 yrs of age   Past Marijuana user     Review of Systems: He has significant diabetic retinopathy and has had laser treatments in the past. He is supposed to see a urologist and has urinary frequency. He has occasional constipation. He has significant diabetic peripheral neuropathy. He has significant sleep apnea. He has never had chest pain. Other than as noted above the remainder of the review of systems is unremarkable.  Physical Exam: BP 161/78  Pulse 85  Temp(Src) 98.2 F (36.8 C) (Oral)  Resp 20  Ht 6\' 1"  (1.854 m)  Wt 116.574 kg (257 lb)  BMI 33.91 kg/m2  SpO2 100%  General appearance: alert, cooperative, appears stated age and no distress Head: Normocephalic, without obvious abnormality, atraumatic Eyes: conjunctivae/corneas clear. PERRL, EOM's intact. Fundi benign. Neck: no adenopathy, no carotid bruit, no JVD and supple, symmetrical, trachea midline Lungs:  clear to auscultation bilaterally Heart: regular rate and rhythm, S1, S2 normal, no murmur, click, rub or gallop Abdomen: soft, non-tender; bowel sounds normal; no masses,  no organomegaly Rectal: deferred Extremities: 1-2+ edema, pulses 2 + distally Pulses: 2+ and symmetric Neurologic: Reduced sensation in the lower extremities, significant tremor involving left greater than right hand, gait not formally tested  Labs: CBC  Recent Labs  03/05/13 1534 03/06/13 0647  WBC 4.8 4.2  RBC 3.78* 3.60*  HGB 10.1* 9.6*  HCT 32.1* 30.4*  PLT 247 215  MCV 84.9 84.4  MCH 26.7 26.7  MCHC 31.5 31.6  RDW 14.2 14.0  LYMPHSABS 1.1  --   MONOABS 1.0  --   EOSABS 0.1  --   BASOSABS 0.0  --    CMP   Recent Labs  03/05/13 1534 03/06/13 0647  NA 137 140  K 5.1 4.2  CL 101 107  CO2 26 23  GLUCOSE 116* 73  BUN 37* 33*  CREATININE 2.62* 2.30*  CALCIUM 9.4 8.7  PROT 6.9  --   ALBUMIN 3.1*  --   AST 21  --   ALT 7  --   ALKPHOS 61  --   BILITOT 0.4  --   GFRNONAA 24* 29*  GFRAA 28* 33*   BNP (last 3 results)  Recent Labs  12/01/12 0943 03/06/13 0100  PROBNP 4046.0* 761.9*   Cardiac Panel (last 3 results)  Recent Labs  03/05/13 1824 03/06/13 0100 03/06/13 0647  TROPONINI <0.30 <0.30 <0.30     Radiology: Cardiomegaly, no acute abnormality.  EKG: Sinus rhythm, T wave inversions in the infero-lateral leads which are new compared to previous EKG.  Signed:  Darden Palmer MD [facility]   Cardiology Consultant  03/06/2013, 9:17 AM

## 2013-03-06 NOTE — Progress Notes (Signed)
  Echocardiogram 2D Echocardiogram has been performed.  [name] 03/06/2013, 11:26 AM

## 2013-03-06 NOTE — Evaluation (Signed)
Physical Therapy Evaluation Patient Details Name: [name] MRN: [id number] DOB: 11/21/1950 Today's Date: 03/06/2013 Time: 6578-4696 PT Time Calculation (min): 26 min  PT Assessment / Plan / Recommendation Clinical Impression  Pt admitted secondary to syncope presents with functional mobility limited by ongoing Bil knee pain and generalized deconditioning and fatigue.      PT Assessment  Patient needs continued PT services    Follow Up Recommendations  Home health PT    Does the patient have the potential to tolerate intense rehabilitation      Barriers to Discharge None      Equipment Recommendations  None recommended by PT    Recommendations for Other Services OT consult   Frequency Min 3X/week    Precautions / Restrictions Precautions Precautions: Fall Restrictions Weight Bearing Restrictions: No   Pertinent Vitals/Pain 6/10 L knee pain      Mobility  Bed Mobility Bed Mobility: Supine to Sit Supine to Sit: 4: Min guard;With rails;HOB elevated Details for Bed Mobility Assistance: Increased time Transfers Transfers: Sit to Stand;Stand to Sit Sit to Stand: 1: +2 Total assist;From bed;With upper extremity assist;From elevated surface Sit to Stand: Patient Percentage: 70% Stand to Sit: 1: +2 Total assist;To chair/3-in-1;With armrests;With upper extremity assist Stand to Sit: Patient Percentage: 80% Details for Transfer Assistance: cues for transition position and use of UEs to self assist Ambulation/Gait Ambulation/Gait Assistance: 1: +2 Total assist Ambulation/Gait: Patient Percentage: 80% Ambulation Distance (Feet): 18 Feet Assistive device: Rolling walker Ambulation/Gait Assistance Details: cues for posture, sequence, position from RW  Gait Pattern: Step-to pattern;Decreased step length - right;Decreased step length - left;Shuffle;Decreased stance time - left;Wide base of support Gait velocity: slow General Gait Details: L knee buckling noted with  increased distance  Stairs: No    Exercises     PT Diagnosis: Difficulty walking  PT Problem List: Decreased strength;Decreased range of motion;Decreased activity tolerance;Decreased mobility;Decreased knowledge of use of DME;Decreased balance;Pain;Obesity PT Treatment Interventions: DME instruction;Gait training;Functional mobility training;Therapeutic activities;Therapeutic exercise;Balance training;Patient/family education   PT Goals Acute Rehab PT Goals PT Goal Formulation: With patient Time For Goal Achievement: 03/16/13 Potential to Achieve Goals: Good Pt will go Supine/Side to Sit: with modified independence PT Goal: Supine/Side to Sit - Progress: Goal set today Pt will go Sit to Supine/Side: with modified independence PT Goal: Sit to Supine/Side - Progress: Goal set today Pt will go Sit to Stand: with supervision PT Goal: Sit to Stand - Progress: Goal set today Pt will go Stand to Sit: with supervision PT Goal: Stand to Sit - Progress: Goal set today Pt will Ambulate: 51 - 150 feet;with supervision;with rolling walker PT Goal: Ambulate - Progress: Goal set today  Visit Information  Last PT Received On: 03/06/13 Assistance Needed: +2 (saftey)    Subjective Data  Subjective: I was walking up to a quarter mile with my walker at home but have not moved much since I've been here.  My knees hurt Patient Stated Goal: HOme   Prior Functioning  Home Living Lives With: Spouse Available Help at Discharge: Family Type of Home: Apartment Home Access: Level entry Home Layout: One level Home Adaptive Equipment: Environmental consultant - four wheeled Additional Comments: Pt and wife considering reg RW for home use dependent on progress - pt has only 4 wheeler Prior Function Level of Independence: Independent with assistive device(s) Driving: No Vocation: On disability Communication Communication: No difficulties Dominant Hand: Right    Cognition  Cognition Arousal/Alertness:  Awake/alert Behavior During Therapy: WFL for tasks assessed/performed  Overall Cognitive Status: Within Functional Limits for tasks assessed    Extremity/Trunk Assessment Right Upper Extremity Assessment RUE ROM/Strength/Tone: [facility] for tasks assessed Left Upper Extremity Assessment LUE ROM/Strength/Tone: WFL for tasks assessed Right Lower Extremity Assessment RLE ROM/Strength/Tone: Deficits RLE ROM/Strength/Tone Deficits: Knee flex ltd to 90 degrees with 4-/5 quads RLE Sensation: History of peripheral neuropathy Left Lower Extremity Assessment LLE ROM/Strength/Tone: Deficits LLE ROM/Strength/Tone Deficits: Knee flex pain ltd to 80 degrees with 3-/5 quads LLE Sensation: History of peripheral neuropathy   Balance    End of Session PT - End of Session Equipment Utilized During Treatment: Gait belt Activity Tolerance: Patient limited by pain Patient left: in chair;with call bell/phone within reach;with family/visitor present Nurse Communication: Mobility status  GP     Alia Parsley 03/06/2013, 5:15 PM

## 2013-03-06 NOTE — Progress Notes (Signed)
Patient place on CPAP with nasal mask. Patient was unaware of what his setting were at home. Settings are auto titrate. Patient is tolerating well at this time.

## 2013-03-07 DIAGNOSIS — I119 Hypertensive heart disease without heart failure: Secondary | ICD-10-CM

## 2013-03-07 LAB — GLUCOSE, CAPILLARY: Glucose-Capillary: 88 mg/dL (ref 70–99)

## 2013-03-07 LAB — RENAL FUNCTION PANEL
BUN: 27 mg/dL — ABNORMAL HIGH (ref 6–23)
CO2: 20 mEq/L (ref 19–32)
GFR calc Af Amer: 49 mL/min — ABNORMAL LOW (ref >90)
Glucose, Bld: 76 mg/dL (ref 70–99)
Phosphorus: 3.2 mg/dL (ref 2.3–4.6)
Potassium: 3.6 mEq/L (ref 3.5–5.1)
Sodium: 139 mEq/L (ref 135–145)

## 2013-03-07 LAB — URINE CULTURE: Colony Count: NO GROWTH

## 2013-03-07 MED ORDER — INSULIN DETEMIR 100 UNIT/ML ~~LOC~~ SOLN
12.0000 [IU] | Freq: Every day | SUBCUTANEOUS | Status: DC
  Administered 2013-03-07: 12 [IU] via SUBCUTANEOUS
  Filled 2013-03-07 (×2): qty 0.12

## 2013-03-07 NOTE — Progress Notes (Signed)
Subjective:  Patient feeling better at the present time and has no chest pain. Renal function has improved with hydration and holding Lasix. The lateral EKG changes have improved. He ate breakfast this morning and we were unable to do the stress test today. He has been significantly orthostatic.  Objective:  Vital Signs in the last 24 hours: BP 136/76  Pulse 72  Temp(Src) 98.2 F (36.8 C) (Oral)  Resp 19  Ht 6\' 1"  (1.854 m)  Wt 116.574 kg (257 lb)  BMI 33.91 kg/m2  SpO2 100%  Physical Exam: Pleasant obese black male in no acute distress Lungs:  Clear Cardiac:  Regular rhythm, normal S1 and S2, no S3 Extremities:  1+ edema present  Intake/Output from previous day: 04/20 0701 - 04/21 0700 In: 3025 [P.O.:600; I.V.:2425] Out: 4575 [Urine:4575]  Weight Filed Weights   03/05/13 1919  Weight: 116.574 kg (257 lb)    Lab Results: Basic Metabolic Panel:  Recent Labs  16/10/96 0647 03/07/13 0422  NA 140 139  K 4.2 3.6  CL 107 108  CO2 23 20  GLUCOSE 73 76  BUN 33* 27*  CREATININE 2.30* 1.65*   CBC:  Recent Labs  03/05/13 1534 03/06/13 0647  WBC 4.8 4.2  NEUTROABS 2.6  --   HGB 10.1* 9.6*  HCT 32.1* 30.4*  MCV 84.9 84.4  PLT 247 215   Cardiac Enzymes:  Recent Labs  03/05/13 1824 03/06/13 0100 03/06/13 0647  TROPONINI <0.30 <0.30 <0.30    Telemetry: Sinus rhythm  Assessment/Plan: 1. Syncope likely due to orthostasis. He has had documented orthostasis with standing but still significant hypertension also noted. 2. Abnormal EKG it could be due to LVH but to rule out ischemia 3. Stage III chronic kidney disease with previous renal transplant  Recommendations:  Obtain Lexiscan Cardiolite test in the morning. We also talked about measures to do with orthostasis including thigh high venous support stockings, fluid loading in the morning and avoidance of dehydration.      Darden Palmer  MD [facility]  03/07/2013, 6:46 PM

## 2013-03-07 NOTE — Progress Notes (Signed)
Patient scheduled for 03/08/13  8:30am transport via [facility] to [facility] for [facility]. NPO past  midnight order entered for test.

## 2013-03-07 NOTE — Progress Notes (Signed)
PATIENT DETAILS Name: [name] Age: 62 y.o. Sex: male Date of Birth: 04-Mar-1951 Admit Date: 03/05/2013 Admitting Physician Kathlen Mody, MD PCP:No primary provider on file.  Subjective: Admitted with a syncopal episode that occurred on 4/16-but has been feeling "weak" ever since. Claims to have intermittent tremors  Assessment/Plan: Principal Problem:   Syncope -has had previous episode of orthostatic hypotension-suspect this particular syncopal epsiode is from this mechanism as well. -orthostatics better this am -decrease IVF-likely needs to cut down on Lasix dosing on discharge -does have new EKG changes in inferior leads concerning for ischemia-stress test now rescheduled for am  Active Problems: Abnormal EKG with new inferolateral T -new inferolateral T-wave changes since previous EKG -appreciate cards eval -Echo done 4/21 showed no wall motion abnormality in the abovementioned area -Nuclear stress test in a m -doubt we can offer LHC given CKD-but defer this to cards  Acute on CKD stage 4 -holding lasix creatinine much better following hydration -suspect we need to cut down on the lasix dose  Gen weakness/tremors  -suspect that this maybe just related to being dry,orthostatic-and worsening renal function -but given immunocompromised state- MRI brain done-no acute or concerning abnormalities -since tremors have resolved-no further w/u needed  Renal transplant-with underlying stage 4 CKD -c.w Prednisone,cyclosporine,cellcept -appreciate Renal input  HTN -moderate control -c/w Labetolol  DM -CBG's controlled -c/w Levemir and Victoza -last A1C-7.4 on 2/16  Gastroparesis -on Reglan -seems stable at present-no vomiting  Peripheral Neuropathy -on Neurontin  Dyslipidemia -c/w Statin  Disposition: Remain inpatient  DVT Prophylaxis: Prophylactic Lovenox   Code Status: Full code   Family Communication No family at  bedside  Procedures:  None  CONSULTS:  cardiology  PHYSICAL EXAM: Vital signs in last 24 hours: Filed Vitals:   03/07/13 0533 03/07/13 0727 03/07/13 0729 03/07/13 0731  BP: 159/80 140/78 143/77 130/61  Pulse: 75 75 78 91  Temp: 98.2 F (36.8 C) 97.6 F (36.4 C)    TempSrc: Oral Oral    Resp: 18 18    Height:      Weight:      SpO2: 100% 100%      Weight change:  Filed Weights   03/05/13 1919  Weight: 116.574 kg (257 lb)   Body mass index is 33.91 kg/(m^2).   Gen Exam: Awake and alert with clear speech.   Neck: Supple, No JVD.   Chest: B/L Clear.   CVS: S1 S2 Regular, no murmurs.  Abdomen: soft, BS +, non tender, non distended.  Extremities: trace edema, lower extremities warm to touch. Neurologic: Non Focal.   Skin: No Rash.   Wounds: N/A.    Intake/Output from previous day:  Intake/Output Summary (Last 24 hours) at 03/07/13 0925 Last data filed at 03/07/13 0730  Gross per 24 hour  Intake   3385 ml  Output   4900 ml  Net  -1515 ml     LAB RESULTS: CBC  Recent Labs Lab 03/05/13 1534 03/06/13 0647  WBC 4.8 4.2  HGB 10.1* 9.6*  HCT 32.1* 30.4*  PLT 247 215  MCV 84.9 84.4  MCH 26.7 26.7  MCHC 31.5 31.6  RDW 14.2 14.0  LYMPHSABS 1.1  --   MONOABS 1.0  --   EOSABS 0.1  --   BASOSABS 0.0  --     Chemistries   Recent Labs Lab 03/05/13 1534 03/06/13 0647 03/07/13 0422  NA 137 140 139  K 5.1 4.2 3.6  CL 101 107 108  CO2 26 23 20  GLUCOSE 116* 73 76  BUN 37* 33* 27*  CREATININE 2.62* 2.30* 1.65*  CALCIUM 9.4 8.7 8.1*  MG  --  1.7  --     CBG:  Recent Labs Lab 03/06/13 0727 03/06/13 1158 03/07/13 0657 03/07/13 0801  GLUCAP 75 79 66* 104*    GFR Estimated Creatinine Clearance: 61.3 ml/min (by C-G formula based on Cr of 1.65).  Coagulation profile No results found for this basename: INR, PROTIME,  in the last 168 hours  Cardiac Enzymes  Recent Labs Lab 03/05/13 1824 03/06/13 0100 03/06/13 0647  TROPONINI <0.30 <0.30  <0.30    No components found with this basename: POCBNP,  No results found for this basename: DDIMER,  in the last 72 hours  Recent Labs  03/06/13 0100  HGBA1C 6.7*   No results found for this basename: CHOL, HDL, LDLCALC, TRIG, CHOLHDL, LDLDIRECT,  in the last 72 hours No results found for this basename: TSH, T4TOTAL, FREET3, T3FREE, THYROIDAB,  in the last 72 hours No results found for this basename: VITAMINB12, FOLATE, FERRITIN, TIBC, IRON, RETICCTPCT,  in the last 72 hours No results found for this basename: LIPASE, AMYLASE,  in the last 72 hours  Urine Studies No results found for this basename: UACOL, UAPR, USPG, UPH, UTP, UGL, UKET, UBIL, UHGB, UNIT, UROB, ULEU, UEPI, UWBC, URBC, UBAC, CAST, CRYS, UCOM, BILUA,  in the last 72 hours  MICROBIOLOGY: No results found for this or any previous visit (from the past 240 hour(s)).  RADIOLOGY STUDIES/RESULTS: Dg Chest 2 View  03/05/2013  *RADIOLOGY REPORT*  Clinical Data: 62 year old male syncope weakness diabetes.  CHEST - 2 VIEW  Comparison: 02/03/2013 and earlier.  Findings: Semi upright AP and lateral views of the chest. Stable cardiomegaly and mediastinal contours.  Stable to mildly improved lung volumes.  The lungs are clear.  No pneumothorax or effusion. No acute osseous abnormality identified.  IMPRESSION: Stable cardiomegaly.  No acute cardiopulmonary abnormality.   Original Report Authenticated By: Erskine Speed, M.D.    Ct Head Wo Contrast  03/05/2013  *RADIOLOGY REPORT*  Clinical Data: Syncope, weakness  CT HEAD WITHOUT CONTRAST  Technique:  Contiguous axial images were obtained from the base of the skull through the vertex without contrast.  Comparison: 01/10/2013  Findings: No evidence of parenchymal hemorrhage or extra-axial fluid collection. No mass lesion, mass effect, or midline shift.  No CT evidence of acute infarction.  Subcortical white matter and periventricular small vessel ischemic changes.  Intracranial  atherosclerosis.  Stable mild frontal lobe atrophy.  Near complete opacification of the left frontal sinus.  Partial opacification of the left ethmoid sinus.  Mild partial opacification of the left mastoid air cells (series 3/image 3).  No evidence of calvarial fracture.  IMPRESSION: No evidence of acute intracranial abnormality.  Atrophy with small vessel ischemic changes and intracranial atherosclerosis.  Paranasal sinus disease, as described above.   Original Report Authenticated By: Charline Bills, M.D.     MEDICATIONS: Scheduled Meds: . aspirin EC  81 mg Oral Daily  . capsaicin  1 application Topical TID PC & HS  . cycloSPORINE modified  100 mg Oral BID  . cycloSPORINE modified  50 mg Oral BID  . enoxaparin (LOVENOX) injection  60 mg Subcutaneous Q24H  . gabapentin  300 mg Oral BID  . insulin detemir  15 Units Subcutaneous QHS  . labetalol  150 mg Oral BID  . Liraglutide  1.2 mg Subcutaneous Daily  . loratadine  10 mg Oral Daily  .  metoCLOPramide  10 mg Oral Daily  . mycophenolate  500 mg Oral BID  . ondansetron  4 mg Intravenous Once  . pantoprazole  40 mg Oral Daily  . pravastatin  20 mg Oral QPC supper  . predniSONE  5 mg Oral Daily  . sodium chloride  3 mL Intravenous Q12H  . sulfamethoxazole-trimethoprim  1 tablet Oral Q M,W,F   Continuous Infusions: . sodium chloride 20 mL/hr (03/07/13 0832)   PRN Meds:.acetaminophen, docusate sodium, fluticasone, HYDROcodone-acetaminophen, ondansetron (ZOFRAN) IV, ondansetron, polyethylene glycol  Antibiotics: Anti-infectives   Start     Dose/Rate Route Frequency Ordered Stop   03/07/13 1000  sulfamethoxazole-trimethoprim (BACTRIM,SEPTRA) 400-80 MG per tablet 1 tablet     1 tablet Oral Every M-W-F 03/05/13 1901         Jeoffrey Massed, MD  [facility] Pager:[phone]  If 7PM-7AM, please contact night-coverage [email or web address] Password [email or web address] 03/07/2013, 9:25 AM   LOS: 2 days

## 2013-03-07 NOTE — Progress Notes (Signed)
Physical Therapy Treatment Patient Details Name: [name] MRN: [id number] DOB: Aug 02, 1951 Today's Date: 03/07/2013 Time: 4098-1191 PT Time Calculation (min): 18 min  PT Assessment / Plan / Recommendation Comments on Treatment Session  Pt agreed to try despite c/o 12/10 L knee pain and struggled to make it 20 feet.  Orthostatic Bp's: Supine 166/79 (102) EOB 144/72 (90) Standing 128/69 (82) Amb 20" 153/71 (90) More attention was directed to L knee pain   Follow Up Recommendations  Home health PT     Does the patient have the potential to tolerate intense rehabilitation     Barriers to Discharge        Equipment Recommendations  None recommended by PT    Recommendations for Other Services    Frequency Min 3X/week   Plan      Precautions / Restrictions Precautions Precautions: Fall   Pertinent Vitals/Pain C/o 12/10 L knee pain Declined pain meds "because they make me constipated"    Mobility  Bed Mobility Bed Mobility: Supine to Sit Supine to Sit: 4: Min guard;With rails;HOB elevated Details for Bed Mobility Assistance: Increased time Transfers Transfers: Sit to Stand;Stand to Sit Sit to Stand: 4: Min guard Stand to Sit: 4: Min guard Details for Transfer Assistance: excessive WB thru RW due to 12/10 L knee pain Ambulation/Gait Ambulation/Gait Assistance: 3: Mod assist Ambulation Distance (Feet): 20 Feet Assistive device: Rolling walker Ambulation/Gait Assistance Details: amb with much effort and excessive WB thru RW as pt c/o 12/10 pain Gait Pattern: Step-to pattern;Decreased step length - right;Decreased step length - left;Shuffle;Decreased stance time - left;Wide base of support Gait velocity: slow General Gait Details: L knee buckling noted with increased distance      PT Goals                                       progressing    Visit Information  Last PT Received On: 03/07/13 Assistance Needed: +2    Subjective Data      Cognition    good   Balance   fair  End of Session PT - End of Session Equipment Utilized During Treatment: Gait belt Activity Tolerance: Patient limited by pain Patient left: in chair;with call bell/phone within reach;with family/visitor present   Felecia Shelling  PTA [facility]  Acute  Rehab Pager      [phone]

## 2013-03-07 NOTE — Progress Notes (Signed)
Subjective: Knee pain, otherwise much better, getting out of bed. No syncope or lightheadedness.   Objective Vital signs in last 24 hours: Filed Vitals:   03/07/13 1315 03/07/13 1526 03/07/13 1534 03/07/13 1535  BP: 154/71 206/95 149/76 136/76  Pulse: 73 125 72 72  Temp: 98.2 F (36.8 C)     TempSrc: Oral     Resp: 18 19    Height:      Weight:      SpO2: 100% 100% 100% 100%   Weight change:   Intake/Output Summary (Last 24 hours) at 03/07/13 1642 Last data filed at 03/07/13 1536  Gross per 24 hour  Intake   3320 ml  Output   5051 ml  Net  -1731 ml   Labs: Basic Metabolic Panel:  Recent Labs Lab 03/05/13 1534 03/06/13 0647 03/07/13 0422  NA 137 140 139  K 5.1 4.2 3.6  CL 101 107 108  CO2 26 23 20   GLUCOSE 116* 73 76  BUN 37* 33* 27*  CREATININE 2.62* 2.30* 1.65*  CALCIUM 9.4 8.7 8.1*  PHOS  --   --  3.2   Liver Function Tests:  Recent Labs Lab 03/05/13 1534 03/07/13 0422  AST 21  --   ALT 7  --   ALKPHOS 61  --   BILITOT 0.4  --   PROT 6.9  --   ALBUMIN 3.1* 2.6*   No results found for this basename: LIPASE, AMYLASE,  in the last 168 hours No results found for this basename: AMMONIA,  in the last 168 hours CBC:  Recent Labs Lab 03/05/13 1534 03/06/13 0647  WBC 4.8 4.2  NEUTROABS 2.6  --   HGB 10.1* 9.6*  HCT 32.1* 30.4*  MCV 84.9 84.4  PLT 247 215   PT/INR: @LABRCNTIP (inr:5)   Scheduled Meds ) . aspirin EC  81 mg Oral Daily  . capsaicin  1 application Topical TID PC & HS  . cycloSPORINE modified  100 mg Oral BID  . cycloSPORINE modified  50 mg Oral BID  . enoxaparin (LOVENOX) injection  60 mg Subcutaneous Q24H  . gabapentin  300 mg Oral BID  . insulin detemir  12 Units Subcutaneous QHS  . labetalol  150 mg Oral BID  . Liraglutide  1.2 mg Subcutaneous Daily  . loratadine  10 mg Oral Daily  . metoCLOPramide  10 mg Oral Daily  . mycophenolate  500 mg Oral BID  . ondansetron  4 mg Intravenous Once  . pantoprazole  40 mg Oral Daily   . pravastatin  20 mg Oral QPC supper  . predniSONE  5 mg Oral Daily  . sodium chloride  3 mL Intravenous Q12H  . sulfamethoxazole-trimethoprim  1 tablet Oral Q M,W,F    Physical Exam:  Blood pressure 136/76, pulse 72, temperature 98.2 F (36.8 C), temperature source Oral, resp. rate 19, height 6\' 1"  (1.854 m), weight 116.574 kg (257 lb), SpO2 100.00%.  Skin: no rash, cyanosis  HEENT: EOMI, sclera anicteric, throat moist  Neck: no JVD, no LAN  Chest: clear bilat no rales  CV: regular, no rub or gallop, no carotid or femoral bruits, pedal pulses intact  Abdomen: soft, obese, nontender, no HSM, no ascites  Ext: trace ankle edema bilat, no joint effusion or deformity, no gangrene or ulceration  Neuro: alert, Ox3, no focal deficit   Impression/Plan  1. Syncope / acute on CRF / volume depletion- volume depletion playing a significant role. BP up and creat down with IVF"s and  holding lasix.  Creatinine is down to baseline 1.6 today.  Would not continue any lasix at this time.  We discussed the signs of fluid retention and he knows to contact his kidney doctor if this develops. He will f/u with Dr Abel Presto as outpatient. No other changes to recommend.  Will sign off.  2. Kidney transplant, CKD stage IIIb- cont meds with CyA, Cellcept, pred 3. HTN- on labetalol 150 bid, continue. Stopped lasix 4. Tremors- resolved  5. DM, longstanding 6. Recurrent UTI's- repeat cx in progress. UA with 7-10 wbc, 0-2 rbc. No fever    Vinson Moselle  MD [phone] pgr    [phone] cell 03/07/2013, 4:42 PM

## 2013-03-07 NOTE — Progress Notes (Signed)
Pt brought his cpap from home & set himself up on. He is wearing at this time & doing well.  Jacqulynn Cadet RRT

## 2013-03-07 NOTE — Progress Notes (Signed)
Inpatient Diabetes Program Recommendations  AACE/ADA: New Consensus Statement on Inpatient Glycemic Control (2013)  Target Ranges:  Prepandial:   less than 140 mg/dL      Peak postprandial:   less than 180 mg/dL (1-2 hours)      Critically ill patients:  140 - 180 mg/dL  Results for [name], [name] (MRN [id number]) as of 03/07/2013 11:09  Ref. Range 03/06/2013 07:27 03/06/2013 11:58 03/07/2013 06:57 03/07/2013 08:01  Glucose-Capillary Latest Range: 70-99 mg/dL 75 79 66 (L) 782 (H)   Inpatient Diabetes Program Recommendations Insulin - Basal: Please consider decreasing Levemir to 12 units QHS and adjust accordingly. Correction (SSI): Please consider ordering CBGs with Novolog sensitive correction while inpatient (ACHS and change to Q4H while NPO).   Note: Patient has a history of diabetes and takes Levemir 15 units QHS and Victoza 1.2 mg daily at home for diabetes management.  Currently, patient is ordered to receive  Levemir 15 untis QHS and Victoza 1.2 mg daily for inpatient glycemic control.  Currently patient is ordered CBGs once a day.  Fasting glucose on 4/20 was 75 mg/dl and this morning fasting glucose was 66 mg/dl.  Patient will be NPO after midnight for Myoview tomorrow. Please consider decreasing Levemir to 12 units QHS, ordering CBGs ACHS with Novolog sensitive correction scale (and change to Q4H when patient becomes NPO).  Will continue to follow.  Thanks, Orlando Penner, RN, BSN, CCRN Diabetes Coordinator Inpatient Diabetes Program [phone]

## 2013-03-07 NOTE — Progress Notes (Signed)
Nutrition Brief Note  Patient identified on the Malnutrition Screening Tool (MST) Report  Body mass index is 33.91 kg/(m^2). Patient meets criteria for class I obesity based on current BMI.   Current diet order is CHO modified, patient is consuming approximately 100% of meals at this time. Labs and medications reviewed. Noted pt with elevated renal labs likely r/t acute on chronic kidney disease. Met with pt who reports eating well PTA, 3 meals/day and following diabetic diet. Noted pt recently met with RD [facility] last month. Pt reports 35 pound intentional weight loss since January of this year due to cutting back on excess portion sizes. Pt currently eating excellent without any nutritional concerns.   No nutrition interventions warranted at this time. If nutrition issues arise, please consult RD.   Levon Hedger MS, RD, LDN [phone] Pager [phone] After Hours Pager

## 2013-03-07 NOTE — Care Management Note (Signed)
    Page 1 of 1   03/07/2013     12:18:33 PM   CARE MANAGEMENT NOTE 03/07/2013  Patient:  [name], [name]   Account Number:  [id number]  Date Initiated:  03/07/2013  Documentation initiated by:  Lanier Clam  Subjective/Objective Assessment:   ADMITTED W/SYNCOPE.HX:CKD,ESRD,KIDNEY TRANSPLANT.     Action/Plan:   FORM HOME W/SPOUSE.HAS PCP,PHARMACY.HAS RW.ACTIVE W/[facility] HHPT.   Anticipated DC Date:  03/14/2013   Anticipated DC Plan:  HOME W HOME HEALTH SERVICES      DC Planning Services  CM consult      [facility] Choice  Resumption Of Svcs/PTA Provider   Choice offered to / List presented to:  C-1 Patient           Status of service:  In process, will continue to follow Medicare Important Message given?   (If response is "NO", the following Medicare IM given date fields will be blank) Date Medicare IM given:   Date Additional Medicare IM given:    Discharge Disposition:    Per UR Regulation:  Reviewed for med. necessity/level of care/duration of stay  If discussed at Long Length of Stay Meetings, dates discussed:    Comments:  03/07/13 [facility] RN,BSN NCM [phone] CONFIRMED W/[facility] KRISTEN(REP) PATIENT ACTIVE W/HHPT.PT-HH.RECEIVED HHPT ORDER.FOR LEXI SCAN IN AM.NEPHROLOGY FOLLOWING.

## 2013-03-08 ENCOUNTER — Other Ambulatory Visit: Payer: Self-pay

## 2013-03-08 ENCOUNTER — Inpatient Hospital Stay (HOSPITAL_COMMUNITY)
Admit: 2013-03-08 | Discharge: 2013-03-08 | Disposition: A | Discharge status: Home / Self Care | Payer: Medicare HMO | Attending: Cardiology | Admitting: Cardiology

## 2013-03-08 ENCOUNTER — Inpatient Hospital Stay (HOSPITAL_COMMUNITY): Admission: RE | Admit: 2013-03-08 | Payer: Medicare HMO | Source: Ambulatory Visit

## 2013-03-08 DIAGNOSIS — R9431 Abnormal electrocardiogram [ECG] [EKG]: Secondary | ICD-10-CM

## 2013-03-08 LAB — BASIC METABOLIC PANEL
Calcium: 9 mg/dL (ref 8.4–10.5)
GFR calc Af Amer: 49 mL/min — ABNORMAL LOW (ref >90)
GFR calc non Af Amer: 42 mL/min — ABNORMAL LOW (ref >90)
Glucose, Bld: 77 mg/dL (ref 70–99)
Potassium: 3.9 mEq/L (ref 3.5–5.1)
Sodium: 136 mEq/L (ref 135–145)

## 2013-03-08 LAB — GLUCOSE, CAPILLARY
Glucose-Capillary: 101 mg/dL — ABNORMAL HIGH (ref 70–99)
Glucose-Capillary: 77 mg/dL (ref 70–99)
Glucose-Capillary: 87 mg/dL (ref 70–99)

## 2013-03-08 MED ORDER — INSULIN DETEMIR 100 UNIT/ML ~~LOC~~ SOLN: 12.0000 [IU] | Freq: Every day | SUBCUTANEOUS | Status: DC

## 2013-03-08 MED ORDER — REGADENOSON 0.4 MG/5ML IV SOLN
INTRAVENOUS | Status: AC
  Filled 2013-03-08: qty 5

## 2013-03-08 MED ORDER — TECHNETIUM TC 99M SESTAMIBI GENERIC - CARDIOLITE
30.0000 | Freq: Once | INTRAVENOUS | Status: AC | PRN
  Administered 2013-03-08: 30 via INTRAVENOUS

## 2013-03-08 MED ORDER — TECHNETIUM TC 99M SESTAMIBI GENERIC - CARDIOLITE
10.0000 | Freq: Once | INTRAVENOUS | Status: AC | PRN
  Administered 2013-03-08: 10 via INTRAVENOUS

## 2013-03-08 MED ORDER — PRAVASTATIN SODIUM 20 MG PO TABS: 20.0000 mg | ORAL_TABLET | Freq: Every day | ORAL | Status: DC

## 2013-03-08 MED ORDER — REGADENOSON 0.4 MG/5ML IV SOLN
0.4000 mg | Freq: Once | INTRAVENOUS | Status: AC
  Administered 2013-03-08: 0.4 mg via INTRAVENOUS

## 2013-03-08 NOTE — Progress Notes (Addendum)
Patient is currently active with long-term disease management services with [facility]. Patient will receive a post discharge transition of care call and continued monthly home visits for assessments and for education. Went to bedside to speak with patient. As patient was leaving with [facility] for a procedure, spoke with him briefly at bedside. Made him aware that [facility] Care Management will continue to follow and expressed the importance of wearing his TED hose as well.   Raiford Noble, MSN- Ed, RN,BSN, [facility], [phone]

## 2013-03-08 NOTE — Discharge Summary (Signed)
PATIENT DETAILS Name: [name] Age: 62 y.o. Sex: male Date of Birth: 1951/09/12 MRN: [id number]. Admit Date: 03/05/2013 Admitting Physician: Kathlen Mody, MD PCP:No primary provider on file.  Recommendations for Outpatient Follow-up:  1. Assess need for reinitiation of diuretic  2. Check orthostasis 3. Monitor renal function  PRIMARY DISCHARGE DIAGNOSIS:  Principal Problem:   Syncope Active Problems:   History of renal transplant   Orthostatic syncope   Diabetes mellitus with renal manifestations, controlled   Tremors      PAST MEDICAL HISTORY: Past Medical History  Diagnosis Date  . Diabetes mellitus with renal manifestations, controlled   . Hypertensive heart disease   . Kidney transplant as cause of abnormal reaction or later complication     In [name]-Dr. Colodonato-Dr. Lawerance Cruel is the Transplant dodctor at [facility]  . Hyperlipidemia   . Chronic kidney disease   . Neuromuscular disorder     DISCHARGE MEDICATIONS:   Medication List    STOP taking these medications       furosemide 80 MG tablet  Commonly known as:  LASIX      TAKE these medications       ARTHRITIS PAIN RELIEF 650 MG CR tablet  Generic drug:  acetaminophen  Take 650-1,300 mg by mouth 2 (two) times daily as needed (arthritis).     aspirin EC 81 MG tablet  Take 81 mg by mouth daily.     capsaicin 0.025 % cream  Commonly known as:  ZOSTRIX  Apply 1 application topically 4 (four) times daily - after meals and at bedtime.     cycloSPORINE modified 25 MG capsule  Commonly known as:  NEORAL  Take 50 mg by mouth 2 (two) times daily. In addition to 100mg  po twice daily for total dose of 150mg  twice daily.     cycloSPORINE modified 100 MG capsule  Commonly known as:  NEORAL  Take 100 mg by mouth 2 (two) times daily. In addition to 50mg  twice daily for total 150 mg twice daily.     docusate sodium 100 MG capsule  Commonly known as:  COLACE  Take 100 mg by mouth daily as needed for  constipation.     fexofenadine 180 MG tablet  Commonly known as:  ALLEGRA  Take 180 mg by mouth daily as needed. For allergies     fluticasone 50 MCG/ACT nasal spray  Commonly known as:  FLONASE  Place 1 spray into the nose daily as needed. Allergies or sinus congestion.     gabapentin 300 MG capsule  Commonly known as:  NEURONTIN  Take 1 capsule (300 mg total) by mouth 2 (two) times daily.     HYDROcodone-acetaminophen 5-325 MG per tablet  Commonly known as:  NORCO/VICODIN  Take 1 tablet by mouth every 6 (six) hours as needed.     insulin detemir 100 UNIT/ML injection  Commonly known as:  LEVEMIR  Inject 0.12 mLs (12 Units total) into the skin at bedtime.     labetalol 300 MG tablet  Commonly known as:  NORMODYNE  Take 150 mg by mouth 2 (two) times daily.     metoCLOPramide 10 MG tablet  Commonly known as:  REGLAN  Take 1 tablet (10 mg total) by mouth daily.     mycophenolate 250 MG capsule  Commonly known as:  CELLCEPT  Take 500 mg by mouth 2 (two) times daily.     omeprazole 20 MG capsule  Commonly known as:  PRILOSEC  Take 20 mg by  mouth daily.     polyethylene glycol powder powder  Commonly known as:  GLYCOLAX/MIRALAX  Take 17 g by mouth daily as needed (constipation).     pravastatin 20 MG tablet  Commonly known as:  PRAVACHOL  Take 1 tablet (20 mg total) by mouth daily after supper.     predniSONE 5 MG tablet  Commonly known as:  DELTASONE  Take 5 mg by mouth daily.     sulfamethoxazole-trimethoprim 400-80 MG per tablet  Commonly known as:  BACTRIM,SEPTRA  Take 1 tablet by mouth every Monday, Wednesday, and Friday.     VICTOZA 18 MG/3ML Soln injection  Generic drug:  Liraglutide  Inject 1.2 mg into the skin daily.         BRIEF HPI:  See H&P, Labs, Consult and Test reports for all details in brief, [name] is a 62 y.o. male with multiple medical problems , including, h/o renal transplant on chronic immunosuppression, hypertension,  diabetes mellitus, CKD stage 3 , multiple falls int he past, came in for worsening generalized weakness, lower extremity weakness, after a fall on Wednesday ,syncope for few seconds  CONSULTATIONS:   cardiology and nephrology  PERTINENT RADIOLOGIC STUDIES: Dg Chest 2 View  03/05/2013  *RADIOLOGY REPORT*  Clinical Data: 62 year old male syncope weakness diabetes.  CHEST - 2 VIEW  Comparison: 02/03/2013 and earlier.  Findings: Semi upright AP and lateral views of the chest. Stable cardiomegaly and mediastinal contours.  Stable to mildly improved lung volumes.  The lungs are clear.  No pneumothorax or effusion. No acute osseous abnormality identified.  IMPRESSION: Stable cardiomegaly.  No acute cardiopulmonary abnormality.   Original Report Authenticated By: Erskine Speed, M.D.    Ct Head Wo Contrast  03/05/2013  *RADIOLOGY REPORT*  Clinical Data: Syncope, weakness  CT HEAD WITHOUT CONTRAST  Technique:  Contiguous axial images were obtained from the base of the skull through the vertex without contrast.  Comparison: 01/10/2013  Findings: No evidence of parenchymal hemorrhage or extra-axial fluid collection. No mass lesion, mass effect, or midline shift.  No CT evidence of acute infarction.  Subcortical white matter and periventricular small vessel ischemic changes.  Intracranial atherosclerosis.  Stable mild frontal lobe atrophy.  Near complete opacification of the left frontal sinus.  Partial opacification of the left ethmoid sinus.  Mild partial opacification of the left mastoid air cells (series 3/image 3).  No evidence of calvarial fracture.  IMPRESSION: No evidence of acute intracranial abnormality.  Atrophy with small vessel ischemic changes and intracranial atherosclerosis.  Paranasal sinus disease, as described above.   Original Report Authenticated By: Charline Bills, M.D.    Mr Brain Wo Contrast  03/06/2013  *RADIOLOGY REPORT*  Clinical Data: 62 year old male with altered mental status, weakness,  tremor. History of chronic renal disease.  MRI HEAD WITHOUT CONTRAST  Technique:  Multiplanar, multiecho pulse sequences of the brain and surrounding structures were obtained according to standard protocol without intravenous contrast.  Comparison: Head CT 03/05/2013 and earlier.  Brain MRI 02/21/2007.  Findings: Cerebral volume is not significantly changed since 2008. No restricted diffusion to suggest acute infarction.  No midline shift, mass effect, evidence of mass lesion, ventriculomegaly, extra-axial collection or acute intracranial hemorrhage. Cervicomedullary junction and pituitary are within normal limits. Major intracranial vascular flow voids are stable.  Scattered mostly subcortical cerebral white matter T2 and FLAIR hyperintensity has mildly progressed since 2008.  Increased FLAIR signal in the intracranial vessels is noted, and is chronic to a degree.  No definite  abnormal FLAIR signal in the subarachnoid spaces.  Subarachnoid spaces appear normal on all other sequences. No cortical encephalomalacia.  Stable and normal for age signal in the deep gray matter, brainstem, and cerebellum.  Stable and negative visualized cervical spine.  Normal bone marrow signal.  Small left mastoid effusion.  Negative visualized nasopharynx.  Right mastoids remain clear.  Other visualized internal auditory structures are grossly normal.  No opacification of the left frontal and ethmoid sinuses.  Stable mild maxillary sinus mucosal thickening. Visualized orbit soft tissues are within normal limits.  Negative scalp soft tissues.  IMPRESSION: 1. No acute intracranial abnormality. 2.  Mild progression of nonspecific cerebral white matter signal changes since 2008, most commonly due to chronic small vessel disease.   Original Report Authenticated By: Erskine Speed, M.D.    Nm Myocar Multi W/spect W/wall Motion / Ef  03/08/2013  *RADIOLOGY REPORT*  Clinical Data:  63 year old male with history of diabetes and renal transplant.  preoperative screening.  MYOCARDIAL IMAGING WITH SPECT (REST AND PHARMACOLOGIC-STRESS) GATED LEFT VENTRICULAR WALL MOTION STUDY LEFT VENTRICULAR EJECTION FRACTION  Technique:  Resting myocardial SPECT imaging was initially performed after intravenous administration of radiopharmaceutical. Myocardial SPECT was subsequently performed after additional radiopharmaceutical injection during pharmacologic-stress supervised by the Cardiology staff.  Quantitative gated imaging was also performed to evaluate left ventricular wall motion, and estimate left ventricular ejection fraction.  Radiopharmaceutical:  Tc-59m Cardiolite at rest and during stress.  Comparison: none  Findings:  Technique: Study is adequate.  Perfusion:  There are no relative decreased counts on stress or rest to suggest reversible ischemia. There is a fixed defect within the apical segment and inferoseptal wall.  There is left ventricular dilatation on rest and stress.  Wall motion:  No focal wall motion abnormality.  Normal contractility.  Left ventricular ejection fraction:  Calculated left ventricular ejection fraction = 53%.  End-diastolic volume equals 157 ml End-systolic volume 73 ml  IMPRESSION:  1. No reversible ischemia.  Potential small to moderate scar within the apical segment of the inferior septal wall  2. Left ventricular dilatation is equal on rest and stress.  3.  No focal wall motion abnormality.  4.  Left ventricular ejection fraction equal53%%   Original Report Authenticated By: Genevive Bi, M.D.      PERTINENT LAB RESULTS: CBC:  Recent Labs  03/06/13 0647  WBC 4.2  HGB 9.6*  HCT 30.4*  PLT 215   CMET CMP     Component Value Date/Time   NA 136 03/08/2013 0510   K 3.9 03/08/2013 0510   CL 103 03/08/2013 0510   CO2 23 03/08/2013 0510   GLUCOSE 77 03/08/2013 0510   BUN 27* 03/08/2013 0510   CREATININE 1.66* 03/08/2013 0510   CALCIUM 9.0 03/08/2013 0510   PROT 6.9 03/05/2013 1534   ALBUMIN 2.6* 03/07/2013  0422   AST 21 03/05/2013 1534   ALT 7 03/05/2013 1534   ALKPHOS 61 03/05/2013 1534   BILITOT 0.4 03/05/2013 1534   GFRNONAA 42* 03/08/2013 0510   GFRAA 49* 03/08/2013 0510    GFR Estimated Creatinine Clearance: 60.9 ml/min (by C-G formula based on Cr of 1.66). No results found for this basename: LIPASE, AMYLASE,  in the last 72 hours  Recent Labs  03/05/13 1824 03/06/13 0100 03/06/13 0647  TROPONINI <0.30 <0.30 <0.30   No components found with this basename: POCBNP,  No results found for this basename: DDIMER,  in the last 72 hours  Recent Labs  03/06/13  0100  HGBA1C 6.7*   No results found for this basename: CHOL, HDL, LDLCALC, TRIG, CHOLHDL, LDLDIRECT,  in the last 72 hours No results found for this basename: TSH, T4TOTAL, FREET3, T3FREE, THYROIDAB,  in the last 72 hours No results found for this basename: VITAMINB12, FOLATE, FERRITIN, TIBC, IRON, RETICCTPCT,  in the last 72 hours Coags: No results found for this basename: PT, INR,  in the last 72 hours Microbiology: Recent Results (from the past 240 hour(s))  URINE CULTURE     Status: None   Collection Time    03/05/13  8:36 PM      Result Value Range Status   Specimen Description URINE, RANDOM   Final   Special Requests NONE   Final   Culture  Setup Time 03/06/2013 14:09   Final   Colony Count NO GROWTH   Final   Culture NO GROWTH   Final   Report Status 03/07/2013 FINAL   Final     BRIEF HOSPITAL COURSE:   Principal Problem: Syncope  -has had previous episode of orthostatic hypotension-suspect this particular syncopal epsiode is from this mechanism as well. -did have positive orthostatic vitals this admission as well. Suspect he has autonomic dysfunction for underlying DM. -He was hydrated with IVF, ted hose was placed-with improvement in his orthostatic vitals. -Nephrology was consulted, they recommended discontinuing Lasix for now, patient has been instructed to call his primary Nephrologist if he develops leg  edema or has significant weight increase.  -Although he did have new T Wave inversions in the inferior leads in the EKG-troponins were negative. He was seen by Dr Donnie Aho in consult, a Echocardiogram was negative for wall motion abnormality and Nuclear stress test was negative for ischemia  Abnormal EKG with new inferolateral T  -new inferolateral T-wave changes since previous EKG  -appreciate cards eval  -Echo done 4/21 showed no wall motion abnormality in the abovementioned area --Nuclear stress 4/22 test negative  Acute on CKD stage 4 -suspect this was secondary to diuretic therapy  -creatinine returned to usual baseline following hydration -Lasix has been discontinued for now-see above discussion  Gen weakness/tremors  -suspect that this maybe just related to being dry,orthostatic-and worsening renal function  -but given immunocompromised state- MRI brain done-no acute or concerning abnormalities  -since tremors have resolved-no further w/u needed  Renal transplant-with underlying stage 4 CKD  -c.w Prednisone,cyclosporine,cellcept  HTN  -moderate control  -c/w Labetolol -as noted above, lasix has been disconitinued  DM  -CBG's controlled  -c/w Levemir and Victoza-Levemir slightly decreased to 12 units -last A1C-7.4 on 2/16 -patient has been instructed to follow with with Dr Elvera Lennox   Gastroparesis  -on Reglan  -seems stable at present-no vomiting   Peripheral Neuropathy  -on Neurontin   Dyslipidemia  -c/w Statin-but at the recommendation of the pharmacist-Pravastatin dose has been reduced to 20 mg daily-because of its interaction with cyclosporine  B/L Knee pain -Left >right -was scheduled for a arthroscopic procedure this Thursday with Dr Magnus Ivan, this is now postponed to allow patient to recover from this acute episode. I have spoken with Dr Magnus Ivan, he will arrange follow up at his office-for arthrocentesis and possible intra-articular steroids in the next few  days, patient and spouse aware   TODAY-DAY OF DISCHARGE:  Subjective:   Casimiro Needle Livengood today has no headache,no chest abdominal pain,no new weakness tingling or numbness, feels much better wants to go home today.   Objective:   Blood pressure 148/70, pulse 77, temperature 98 F (  36.7 C), temperature source Oral, resp. rate 16, height 6\' 1"  (1.854 m), weight 116.574 kg (257 lb), SpO2 100.00%.  Intake/Output Summary (Last 24 hours) at 03/08/13 1618 Last data filed at 03/08/13 0730  Gross per 24 hour  Intake 796.67 ml  Output   1725 ml  Net -928.33 ml   Filed Weights   03/05/13 1919  Weight: 116.574 kg (257 lb)    Exam Awake Alert, Oriented *3, No new F.N deficits, Normal affect [place].AT,PERRAL Supple Neck,No JVD, No cervical lymphadenopathy appriciated.  Symmetrical Chest wall movement, Good air movement bilaterally, CTAB RRR,No Gallops,Rubs or new Murmurs, No Parasternal Heave +ve B.Sounds, Abd Soft, Non tender, No organomegaly appriciated, No rebound -guarding or rigidity. No Cyanosis, Clubbing or edema, No new Rash or bruise  DISCHARGE CONDITION: Stable  DISPOSITION: Home with home health services  DISCHARGE INSTRUCTIONS:    Activity:  As tolerated with Full fall precautions use walker/cane & assistance as needed  Diet recommendation: Diabetic Diet Heart Healthy diet  Discharge Orders   Future Appointments Provider Department Dept Phone   03/23/2013 10:30 AM Nicki Reaper, NP [facility] -[facility] [phone]   05/03/2013 10:00 AM [name], MD [facility] PRIMARY CARE ENDOCRINOLOGY [phone]   Future Orders Complete By Expires     Diet - low sodium heart healthy  As directed     Increase activity slowly  As directed        Follow-up Information   Follow up with TILLEY JR,W SPENCER, MD. Schedule an appointment as soon as possible for a visit in 2 weeks.   Contact information:   [address] [place]  [address] [phone]       Follow up with Kathryne Hitch, MD. Schedule an appointment as soon as possible for a visit in 2 days.   Contact information:   [address] [place] [address] [phone]       Follow up with COLADONATO,JOSEPH A, MD. Schedule an appointment as soon as possible for a visit in 1 week.   Contact information:   [address] [facility] [place] [address] [phone]      Total Time spent on discharge equals 45 minutes.  SignedJeoffrey Massed 03/08/2013 4:18 PM

## 2013-03-08 NOTE — Progress Notes (Signed)
Lexiscan cardiolite done without comps.  Images to follow. No ST changes.  BP still mildly orthostatic but better.  He will need to wear support stockings and try to drink at least 16 ounces of fluid first thing in the morning to attenuate orthotasis.  Darden Palmer MD [facility]

## 2013-03-08 NOTE — Progress Notes (Signed)
Discharge to home wife at bedside. D/c instructions and follow up appointments done and given to patient. PIV removed no s/s of infiltration or swelling. Patient no complaints of any pain or discomfort upon discharge.

## 2013-03-10 ENCOUNTER — Ambulatory Visit (HOSPITAL_COMMUNITY): Admission: RE | Admit: 2013-03-10 | Payer: Medicare HMO | Source: Ambulatory Visit | Admitting: Orthopaedic Surgery

## 2013-03-10 ENCOUNTER — Other Ambulatory Visit: Payer: Self-pay | Admitting: *Deleted

## 2013-03-10 ENCOUNTER — Encounter (HOSPITAL_COMMUNITY): Admission: RE | Payer: Self-pay | Source: Ambulatory Visit

## 2013-03-10 SURGERY — ARTHROSCOPY, KNEE
Anesthesia: General | Laterality: Left

## 2013-03-10 MED ORDER — INSULIN PEN NEEDLE 32G X 4 MM MISC: Status: DC

## 2013-03-10 MED ORDER — LIRAGLUTIDE 18 MG/3ML ~~LOC~~ SOLN: 1.2000 mg | Freq: Every day | SUBCUTANEOUS | Status: DC

## 2013-03-11 ENCOUNTER — Telehealth: Payer: Self-pay | Admitting: *Deleted

## 2013-03-11 IMAGING — CT CT ABD-PELV W/O CM
2 of 4 series · 17 of 46 positions shown, 19 images · non-contrast
Comparison: 01/02/2013 ultrasound

CLINICAL DATA: 61-year-old male with recurrent urinary tract
infection and possible abscess.  History of renal transplant.

CT ABDOMEN AND PELVIS WITHOUT CONTRAST
TECHNIQUE: Multidetector CT imaging of the abdomen and pelvis was
performed following the standard protocol without intravenous
contrast.

[Series 2: abd/pelv w/o 5.0 b31f st · axial · non-contrast · 0.98mm/px · z∈[+744,+1228]mm · 14 of 107 slices shown, 16 images]
[im 5/107  soft-tissue]
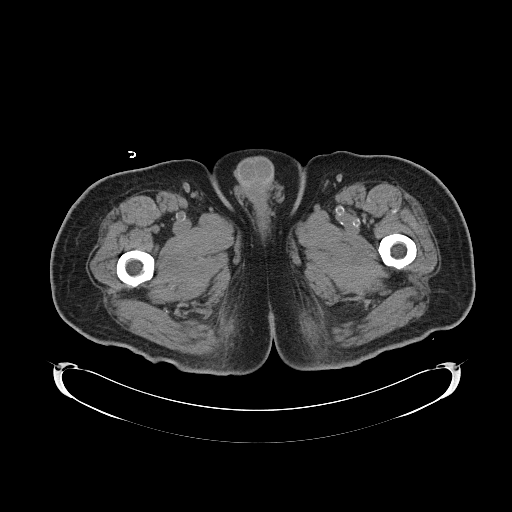
[im 5/107  bone]
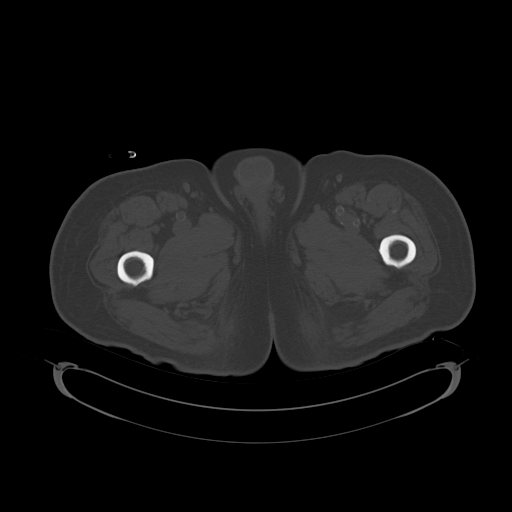
[im 13/107  soft-tissue]
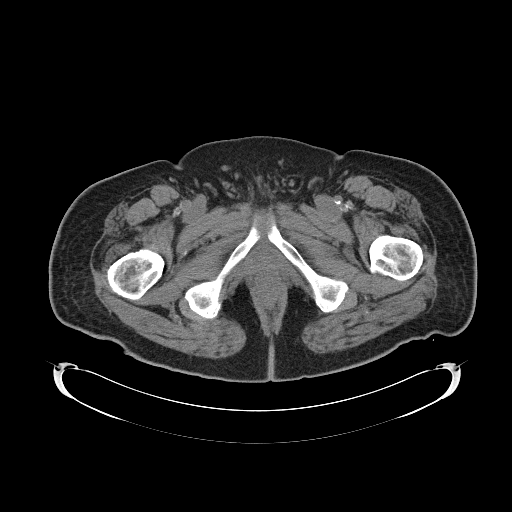
[im 22/107  soft-tissue]
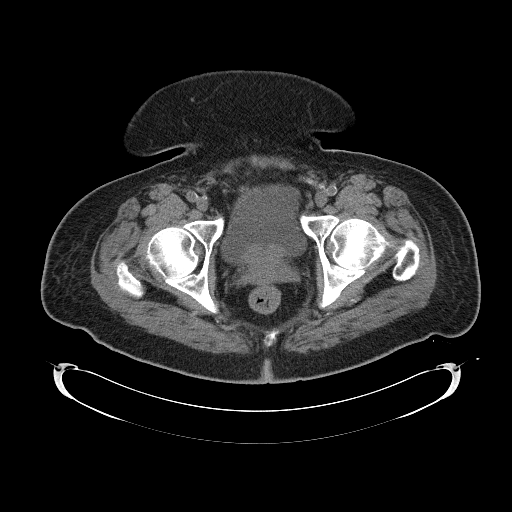
[im 30/107  soft-tissue]
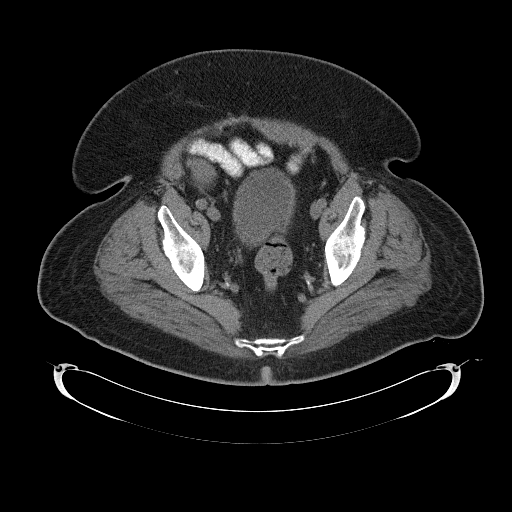
[im 34/107  soft-tissue]
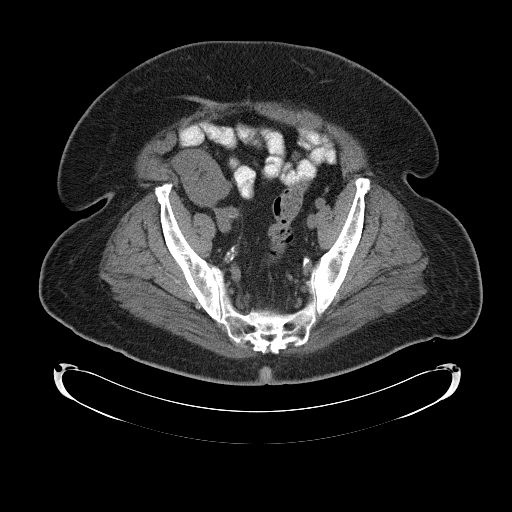
[im 43/107  soft-tissue]
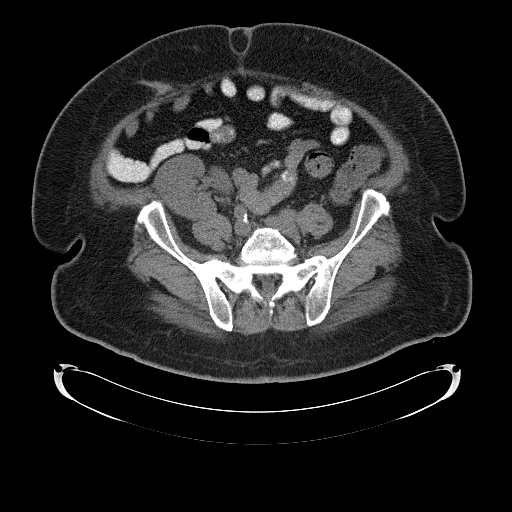
[im 51/107  soft-tissue]
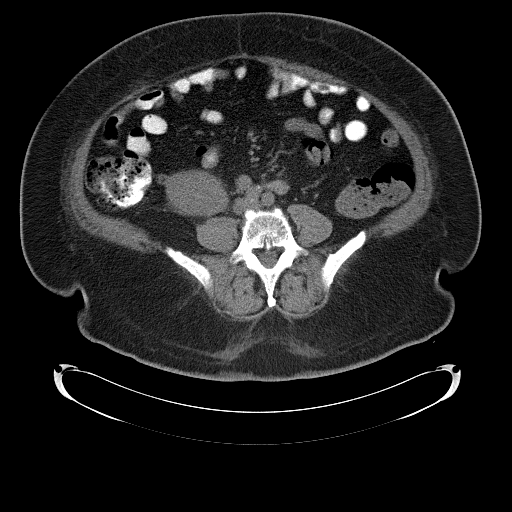
[im 56/107  soft-tissue]
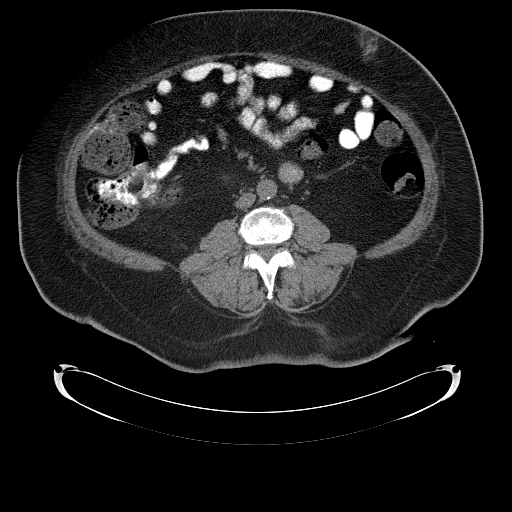
[im 64/107  soft-tissue]
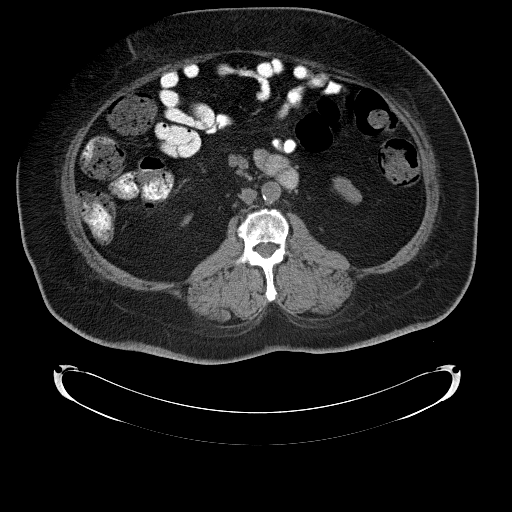
[im 64/107  bone]
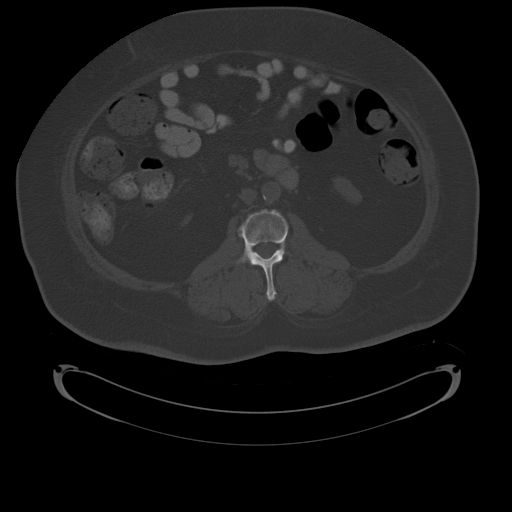
[im 73/107  soft-tissue]
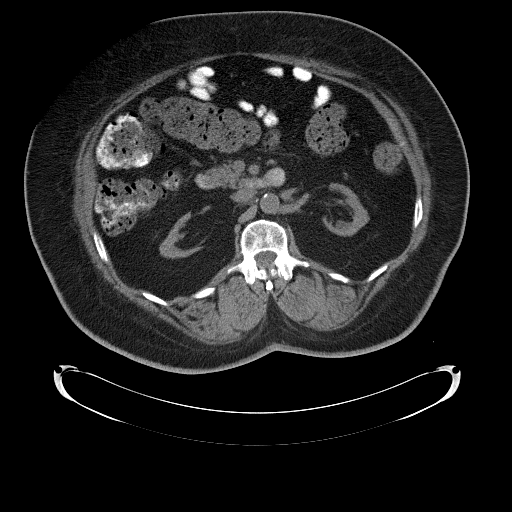
[im 81/107  soft-tissue]
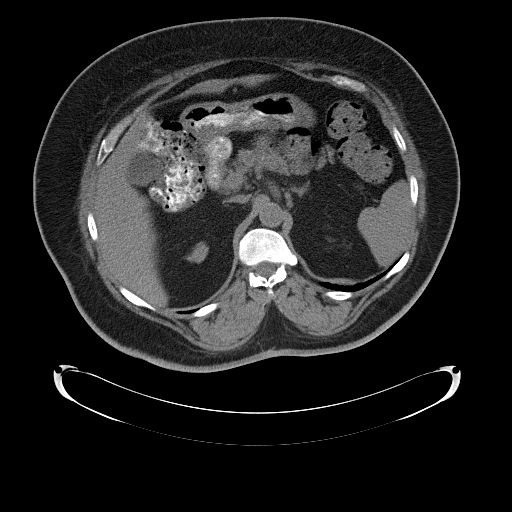
[im 85/107  soft-tissue]
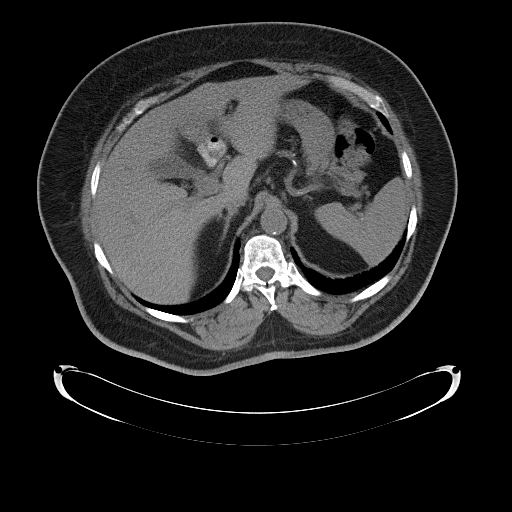
[im 94/107  soft-tissue]
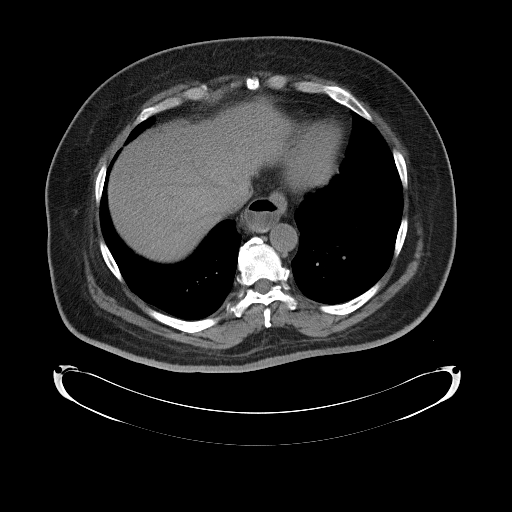
[im 102/107  soft-tissue]
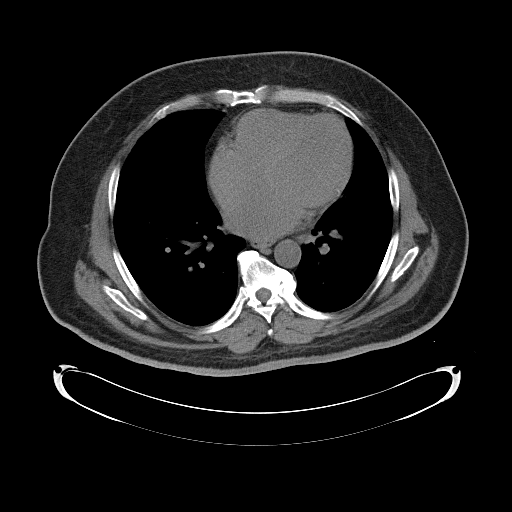

[Series 5: coronals · coronal · 1.03mm/px · 3 of 170 slices shown]
[im 57/170  soft-tissue]
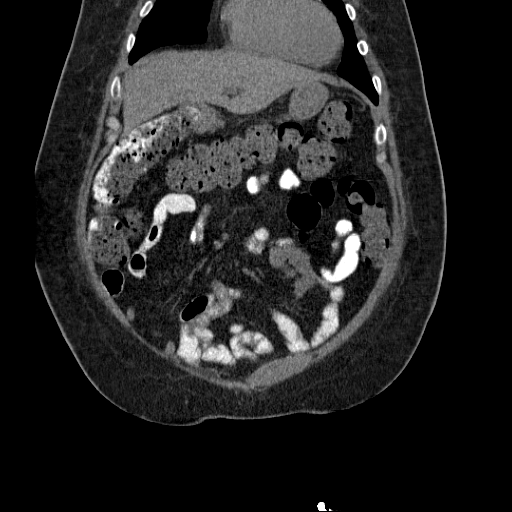
[im 76/170  soft-tissue]
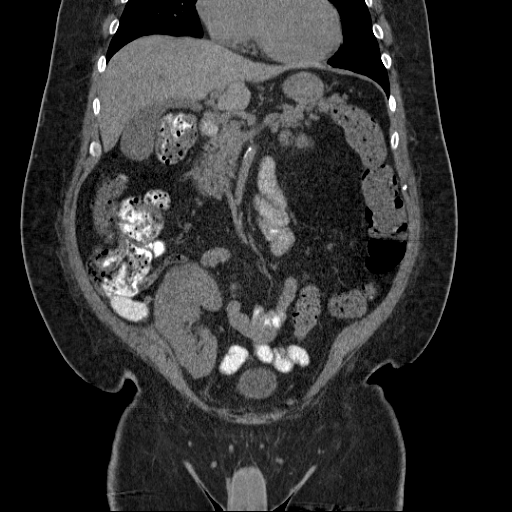
[im 94/170  soft-tissue]
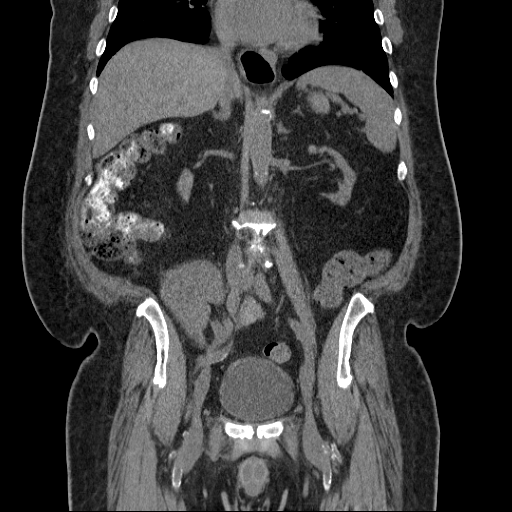

[17 of 46 positions shown; findings below may reference images not displayed]

FINDINGS: Cardiomegaly noted.
A small hiatal hernia is present.

The liver, gallbladder, spleen, adrenal glands, pancreas and
gallbladder are unremarkable.
Extremely atrophic native kidneys bilaterally are noted with a
right pelvic transplant kidney.  No definite renal transplant
abnormality is noted.

Please note that parenchymal abnormalities may be missed as
intravenous contrast was not administered.

No free fluid, enlarged lymph nodes, biliary dilation or abdominal
aortic aneurysm identified.

A moderate to large amount of stool throughout the colon noted.
No other bowel abnormalities are present.
The appendix is normal.
The bladder is unremarkable.

There is no evidence of abscess.

No acute or suspicious bony abnormalities are identified.
IMPRESSION: No evidence of acute abnormality.  No evidence of abscess.

Right pelvic transplant kidney without definite abnormality on this
noncontrast study.

Extremely atrophic native kidneys.

Cardiomegaly and small hiatal hernia.

## 2013-03-11 NOTE — Telephone Encounter (Signed)
Left msg on triage requesting verbal order for [facility] aide to help pt with bathing ect. Also would like to continue seeing pt fro PT from f/u from his hospital stay...Raechel Chute

## 2013-03-14 ENCOUNTER — Encounter (HOSPITAL_COMMUNITY): Payer: Self-pay | Admitting: *Deleted

## 2013-03-14 ENCOUNTER — Inpatient Hospital Stay (HOSPITAL_COMMUNITY)
Admission: EM | Admit: 2013-03-14 | Discharge: 2013-03-23 | DRG: 074 | Disposition: A | Discharge status: Home Health | Payer: Medicare HMO | Attending: Internal Medicine | Admitting: Internal Medicine

## 2013-03-14 ENCOUNTER — Other Ambulatory Visit: Payer: Self-pay

## 2013-03-14 DIAGNOSIS — I517 Cardiomegaly: Secondary | ICD-10-CM | POA: Diagnosis present

## 2013-03-14 DIAGNOSIS — I951 Orthostatic hypotension: Secondary | ICD-10-CM | POA: Diagnosis present

## 2013-03-14 DIAGNOSIS — G252 Other specified forms of tremor: Secondary | ICD-10-CM | POA: Diagnosis present

## 2013-03-14 DIAGNOSIS — Z940 Kidney transplant status: Secondary | ICD-10-CM

## 2013-03-14 DIAGNOSIS — E1143 Type 2 diabetes mellitus with diabetic autonomic (poly)neuropathy: Secondary | ICD-10-CM

## 2013-03-14 DIAGNOSIS — I498 Other specified cardiac arrhythmias: Secondary | ICD-10-CM | POA: Diagnosis present

## 2013-03-14 DIAGNOSIS — G4733 Obstructive sleep apnea (adult) (pediatric): Secondary | ICD-10-CM | POA: Diagnosis present

## 2013-03-14 DIAGNOSIS — E13319 Other specified diabetes mellitus with unspecified diabetic retinopathy without macular edema: Secondary | ICD-10-CM

## 2013-03-14 DIAGNOSIS — G250 Essential tremor: Secondary | ICD-10-CM | POA: Diagnosis present

## 2013-03-14 DIAGNOSIS — M1710 Unilateral primary osteoarthritis, unspecified knee: Secondary | ICD-10-CM | POA: Diagnosis present

## 2013-03-14 DIAGNOSIS — I119 Hypertensive heart disease without heart failure: Secondary | ICD-10-CM

## 2013-03-14 DIAGNOSIS — R791 Abnormal coagulation profile: Secondary | ICD-10-CM | POA: Diagnosis present

## 2013-03-14 DIAGNOSIS — N058 Unspecified nephritic syndrome with other morphologic changes: Secondary | ICD-10-CM | POA: Diagnosis present

## 2013-03-14 DIAGNOSIS — N184 Chronic kidney disease, stage 4 (severe): Secondary | ICD-10-CM | POA: Diagnosis present

## 2013-03-14 DIAGNOSIS — R404 Transient alteration of awareness: Secondary | ICD-10-CM | POA: Diagnosis present

## 2013-03-14 DIAGNOSIS — G909 Disorder of the autonomic nervous system, unspecified: Secondary | ICD-10-CM | POA: Diagnosis present

## 2013-03-14 DIAGNOSIS — E1129 Type 2 diabetes mellitus with other diabetic kidney complication: Secondary | ICD-10-CM | POA: Diagnosis present

## 2013-03-14 DIAGNOSIS — E1149 Type 2 diabetes mellitus with other diabetic neurological complication: Principal | ICD-10-CM | POA: Diagnosis present

## 2013-03-14 DIAGNOSIS — G4730 Sleep apnea, unspecified: Secondary | ICD-10-CM

## 2013-03-14 DIAGNOSIS — E1142 Type 2 diabetes mellitus with diabetic polyneuropathy: Secondary | ICD-10-CM | POA: Diagnosis present

## 2013-03-14 DIAGNOSIS — K3184 Gastroparesis: Secondary | ICD-10-CM | POA: Diagnosis present

## 2013-03-14 DIAGNOSIS — Z794 Long term (current) use of insulin: Secondary | ICD-10-CM

## 2013-03-14 DIAGNOSIS — Z79899 Other long term (current) drug therapy: Secondary | ICD-10-CM

## 2013-03-14 DIAGNOSIS — E785 Hyperlipidemia, unspecified: Secondary | ICD-10-CM | POA: Diagnosis present

## 2013-03-14 DIAGNOSIS — E669 Obesity, unspecified: Secondary | ICD-10-CM | POA: Diagnosis present

## 2013-03-14 DIAGNOSIS — Z6831 Body mass index (BMI) 31.0-31.9, adult: Secondary | ICD-10-CM

## 2013-03-14 DIAGNOSIS — I1310 Hypertensive heart and chronic kidney disease without heart failure, with stage 1 through stage 4 chronic kidney disease, or unspecified chronic kidney disease: Secondary | ICD-10-CM | POA: Diagnosis present

## 2013-03-14 DIAGNOSIS — D638 Anemia in other chronic diseases classified elsewhere: Secondary | ICD-10-CM | POA: Diagnosis present

## 2013-03-14 DIAGNOSIS — R55 Syncope and collapse: Secondary | ICD-10-CM | POA: Diagnosis present

## 2013-03-14 DIAGNOSIS — G709 Myoneural disorder, unspecified: Secondary | ICD-10-CM | POA: Diagnosis present

## 2013-03-14 DIAGNOSIS — R7989 Other specified abnormal findings of blood chemistry: Secondary | ICD-10-CM

## 2013-03-14 HISTORY — DX: Type 1 diabetes mellitus with diabetic autonomic (poly)neuropathy: E10.43

## 2013-03-14 HISTORY — DX: Obesity, unspecified: E66.9

## 2013-03-14 HISTORY — DX: Orthostatic hypotension: I95.1

## 2013-03-14 HISTORY — DX: Gastroparesis: K31.84

## 2013-03-14 HISTORY — DX: Anemia in other chronic diseases classified elsewhere: D63.8

## 2013-03-14 HISTORY — DX: Sleep apnea, unspecified: G47.30

## 2013-03-14 HISTORY — DX: Other specified diabetes mellitus with unspecified diabetic retinopathy without macular edema: E13.319

## 2013-03-14 LAB — POCT I-STAT, CHEM 8
BUN: 38 mg/dL — ABNORMAL HIGH (ref 6–23)
Creatinine, Ser: 2.4 mg/dL — ABNORMAL HIGH (ref 0.50–1.35)
Hemoglobin: 9.9 g/dL — ABNORMAL LOW (ref 13.0–17.0)
Potassium: 4.8 mEq/L (ref 3.5–5.1)
Sodium: 139 mEq/L (ref 135–145)
TCO2: 19 mmol/L (ref 0–100)

## 2013-03-14 LAB — GLUCOSE, CAPILLARY: Glucose-Capillary: 102 mg/dL — ABNORMAL HIGH (ref 70–99)

## 2013-03-14 NOTE — ED Notes (Signed)
Family called stating patient had syncopal episode while sitting in a chair; admitted recently for same problem; no complaints of pain, shortness of breath, chest pain, nausea, or dizziness; pt stable at this time

## 2013-03-14 NOTE — ED Notes (Signed)
ZOX:[id number]<AV> Expected date:<BR> Expected time:<BR> Means of arrival:Ambulance<BR> Comments:<BR> Elderly/syncope

## 2013-03-14 NOTE — Telephone Encounter (Signed)
Called [name] back pt never seen Dr. Felicity Coyer...Raechel Chute

## 2013-03-15 ENCOUNTER — Encounter (HOSPITAL_COMMUNITY): Payer: Self-pay | Admitting: Internal Medicine

## 2013-03-15 ENCOUNTER — Telehealth: Payer: Self-pay | Admitting: *Deleted

## 2013-03-15 ENCOUNTER — Ambulatory Visit: Payer: Medicare HMO | Admitting: Internal Medicine

## 2013-03-15 ENCOUNTER — Other Ambulatory Visit: Payer: Self-pay

## 2013-03-15 DIAGNOSIS — N058 Unspecified nephritic syndrome with other morphologic changes: Secondary | ICD-10-CM

## 2013-03-15 DIAGNOSIS — E1129 Type 2 diabetes mellitus with other diabetic kidney complication: Secondary | ICD-10-CM

## 2013-03-15 DIAGNOSIS — R55 Syncope and collapse: Secondary | ICD-10-CM

## 2013-03-15 DIAGNOSIS — G250 Essential tremor: Secondary | ICD-10-CM

## 2013-03-15 DIAGNOSIS — G252 Other specified forms of tremor: Secondary | ICD-10-CM | POA: Diagnosis present

## 2013-03-15 LAB — CREATININE, SERUM
GFR calc Af Amer: 36 mL/min — ABNORMAL LOW (ref >90)
GFR calc non Af Amer: 31 mL/min — ABNORMAL LOW (ref >90)

## 2013-03-15 LAB — CBC WITH DIFFERENTIAL/PLATELET
Hemoglobin: 9.3 g/dL — ABNORMAL LOW (ref 13.0–17.0)
Lymphocytes Relative: 35 % (ref 12–46)
Lymphs Abs: 1.4 10*3/uL (ref 0.7–4.0)
MCV: 83.1 fL (ref 78.0–100.0)
Monocytes Relative: 13 % — ABNORMAL HIGH (ref 3–12)
Neutrophils Relative %: 49 % (ref 43–77)
Platelets: 285 10*3/uL (ref 150–400)
RBC: 3.55 MIL/uL — ABNORMAL LOW (ref 4.22–5.81)
WBC: 4.1 10*3/uL (ref 4.0–10.5)

## 2013-03-15 LAB — TROPONIN I: Troponin I: <0.3 ng/mL (ref <0.30)

## 2013-03-15 LAB — BASIC METABOLIC PANEL
Calcium: 8.9 mg/dL (ref 8.4–10.5)
GFR calc non Af Amer: 31 mL/min — ABNORMAL LOW (ref >90)
Sodium: 137 mEq/L (ref 135–145)

## 2013-03-15 LAB — CBC
HCT: 30.8 % — ABNORMAL LOW (ref 39.0–52.0)
Hemoglobin: 9.6 g/dL — ABNORMAL LOW (ref 13.0–17.0)
MCHC: 31.2 g/dL (ref 30.0–36.0)
MCV: 83.7 fL (ref 78.0–100.0)

## 2013-03-15 LAB — GLUCOSE, CAPILLARY: Glucose-Capillary: 239 mg/dL — ABNORMAL HIGH (ref 70–99)

## 2013-03-15 MED ORDER — GABAPENTIN 300 MG PO CAPS
300.0000 mg | ORAL_CAPSULE | Freq: Two times a day (BID) | ORAL | Status: DC
  Administered 2013-03-15 – 2013-03-23 (×17): 300 mg via ORAL
  Filled 2013-03-15 (×21): qty 1

## 2013-03-15 MED ORDER — PREDNISONE 20 MG PO TABS
20.0000 mg | ORAL_TABLET | Freq: Every day | ORAL | Status: AC
  Administered 2013-03-15 – 2013-03-17 (×3): 20 mg via ORAL
  Filled 2013-03-15 (×3): qty 1

## 2013-03-15 MED ORDER — PRAVASTATIN SODIUM 20 MG PO TABS
20.0000 mg | ORAL_TABLET | Freq: Every day | ORAL | Status: DC
  Administered 2013-03-15 – 2013-03-22 (×8): 20 mg via ORAL
  Filled 2013-03-15 (×10): qty 1

## 2013-03-15 MED ORDER — INSULIN DETEMIR 100 UNIT/ML ~~LOC~~ SOLN
12.0000 [IU] | Freq: Every day | SUBCUTANEOUS | Status: DC
  Administered 2013-03-15 – 2013-03-22 (×8): 12 [IU] via SUBCUTANEOUS
  Filled 2013-03-15 (×9): qty 0.12

## 2013-03-15 MED ORDER — SODIUM CHLORIDE 0.9 % IV SOLN
INTRAVENOUS | Status: DC
Start: 2013-03-15 — End: 2013-03-16
  Administered 2013-03-15 – 2013-03-16 (×2): via INTRAVENOUS

## 2013-03-15 MED ORDER — INSULIN ASPART 100 UNIT/ML ~~LOC~~ SOLN
0.0000 [IU] | Freq: Three times a day (TID) | SUBCUTANEOUS | Status: DC
  Administered 2013-03-15 (×3): 3 [IU] via SUBCUTANEOUS
  Administered 2013-03-16: 2 [IU] via SUBCUTANEOUS
  Administered 2013-03-16 – 2013-03-17 (×3): 1 [IU] via SUBCUTANEOUS
  Administered 2013-03-17: 2 [IU] via SUBCUTANEOUS

## 2013-03-15 MED ORDER — LIRAGLUTIDE 18 MG/3ML ~~LOC~~ SOLN
1.2000 mg | Freq: Every day | SUBCUTANEOUS | Status: DC
  Administered 2013-03-15 – 2013-03-23 (×8): 1.2 mg via SUBCUTANEOUS

## 2013-03-15 MED ORDER — LABETALOL HCL 300 MG PO TABS
150.0000 mg | ORAL_TABLET | Freq: Two times a day (BID) | ORAL | Status: DC
  Administered 2013-03-15 – 2013-03-18 (×8): 150 mg via ORAL
  Filled 2013-03-15 (×10): qty 0.5

## 2013-03-15 MED ORDER — SODIUM CHLORIDE 0.9 % IJ SOLN
3.0000 mL | Freq: Two times a day (BID) | INTRAMUSCULAR | Status: DC
  Administered 2013-03-15 – 2013-03-23 (×11): 3 mL via INTRAVENOUS

## 2013-03-15 MED ORDER — HYDROCORTISONE SOD SUCCINATE 100 MG IJ SOLR
50.0000 mg | Freq: Three times a day (TID) | INTRAMUSCULAR | Status: AC
  Administered 2013-03-15: 50 mg via INTRAVENOUS
  Administered 2013-03-15: 20:00:00 via INTRAVENOUS
  Administered 2013-03-15: 50 mg via INTRAVENOUS
  Filled 2013-03-15 (×3): qty 1

## 2013-03-15 MED ORDER — SIMVASTATIN 10 MG PO TABS: 10.0000 mg | ORAL_TABLET | Freq: Every day | ORAL | Status: DC

## 2013-03-15 MED ORDER — CYCLOSPORINE MODIFIED (NEORAL) 100 MG PO CAPS
100.0000 mg | ORAL_CAPSULE | Freq: Two times a day (BID) | ORAL | Status: DC
  Administered 2013-03-15 – 2013-03-23 (×17): 100 mg via ORAL
  Filled 2013-03-15 (×18): qty 1

## 2013-03-15 MED ORDER — SULFAMETHOXAZOLE-TRIMETHOPRIM 400-80 MG PO TABS
1.0000 | ORAL_TABLET | ORAL | Status: DC
  Administered 2013-03-16 – 2013-03-23 (×4): 1 via ORAL
  Filled 2013-03-15 (×4): qty 1

## 2013-03-15 MED ORDER — PANTOPRAZOLE SODIUM 40 MG PO TBEC
40.0000 mg | DELAYED_RELEASE_TABLET | Freq: Every day | ORAL | Status: DC
  Administered 2013-03-15 – 2013-03-23 (×9): 40 mg via ORAL
  Filled 2013-03-15 (×9): qty 1

## 2013-03-15 MED ORDER — ASPIRIN EC 81 MG PO TBEC
81.0000 mg | DELAYED_RELEASE_TABLET | Freq: Every morning | ORAL | Status: DC
  Administered 2013-03-15 – 2013-03-23 (×9): 81 mg via ORAL
  Filled 2013-03-15 (×10): qty 1

## 2013-03-15 MED ORDER — HEPARIN SODIUM (PORCINE) 5000 UNIT/ML IJ SOLN
5000.0000 [IU] | Freq: Three times a day (TID) | INTRAMUSCULAR | Status: DC
  Administered 2013-03-15 – 2013-03-17 (×8): 5000 [IU] via SUBCUTANEOUS
  Filled 2013-03-15 (×10): qty 1

## 2013-03-15 MED ORDER — FLUTICASONE PROPIONATE 50 MCG/ACT NA SUSP
1.0000 | Freq: Every day | NASAL | Status: DC
  Administered 2013-03-15 – 2013-03-23 (×9): 1 via NASAL
  Filled 2013-03-15: qty 16

## 2013-03-15 MED ORDER — CYCLOSPORINE MODIFIED (NEORAL) 25 MG PO CAPS
50.0000 mg | ORAL_CAPSULE | Freq: Two times a day (BID) | ORAL | Status: DC
  Administered 2013-03-15 – 2013-03-23 (×17): 50 mg via ORAL
  Filled 2013-03-15 (×18): qty 2

## 2013-03-15 MED ORDER — MYCOPHENOLATE MOFETIL 250 MG PO CAPS
500.0000 mg | ORAL_CAPSULE | Freq: Two times a day (BID) | ORAL | Status: DC
  Administered 2013-03-15 – 2013-03-23 (×17): 500 mg via ORAL
  Filled 2013-03-15 (×18): qty 2

## 2013-03-15 NOTE — ED Provider Notes (Signed)
History     CSN: [id number]  Arrival date & time 03/14/13  5621   First MD Initiated Contact with Patient 03/15/13 0002      Chief Complaint  Patient presents with  . Syncope     (Consider location/radiation/quality/duration/timing/severity/associated sxs/prior treatment) HPI This is a 62 year old male status post kidney transplant. He had 2 episodes of sepsis earlier this year. He has subsequently developed episodes where his left hand becomes tremulous he then gets generally weak and also occasions passes out. He states he is aware and can remember these events. There is no trigger, exacerbating or mitigating factor known.  He was admitted on the 16th of this month for similar event. He had a workup that included an MRI of the brain, echocardiogram and stress test which were unremarkable.   Yesterday afternoon he was sitting in a chair receiving physical therapy on his knees, which have chronic arthritis. He experienced another episode of tremulousness in his left arm than generalized weakness followed by syncope. There was no associated palpitations, chest pain, shortness of breath, nausea, vomiting or dizziness. The episode resolved after a minute or 2. He has been asymptomatic since then.  While waiting in the emergency department he was monitored and the monitor showed him going in and out of ventricular bigeminy. He is not aware of this bigeminy when it occurs.  Past Medical History  Diagnosis Date  . Diabetes mellitus with renal manifestations, controlled   . Hypertensive heart disease   . Kidney transplant as cause of abnormal reaction or later complication     In [name]-Dr. Colodonato-Dr. Lawerance Cruel is the Transplant dodctor at [facility]  . Hyperlipidemia   . Chronic kidney disease   . Neuromuscular disorder     Past Surgical History  Procedure Laterality Date  . Kidney transplant      Family History  Problem Relation Age of Onset  . Diabetes Mother   . Diabetes  Father   . Kidney failure Brother   . Heart failure Sister     History  Substance Use Topics  . Smoking status: Former Games developer  . Smokeless tobacco: Never Used  . Alcohol Use: No      Review of Systems  All other systems reviewed and are negative.    Allergies  Iodine and Shellfish allergy  Home Medications   Current Outpatient Rx  Name  Route  Sig  Dispense  Refill  . aspirin EC 81 MG tablet   Oral   Take 81 mg by mouth every morning.          . capsaicin (ZOSTRIX) 0.025 % cream   Topical   Apply 1 application topically 4 (four) times daily - after meals and at bedtime.         . cycloSPORINE modified (NEORAL) 100 MG capsule   Oral   Take 100 mg by mouth 2 (two) times daily. In addition to 50mg  twice daily for total 150 mg twice daily.         . cycloSPORINE modified (NEORAL) 25 MG capsule   Oral   Take 50 mg by mouth 2 (two) times daily. In addition to 100mg  po twice daily for total dose of 150mg  twice daily.         . fluticasone (FLONASE) 50 MCG/ACT nasal spray   Nasal   Place 1 spray into the nose daily as needed. Allergies or sinus congestion.         . gabapentin (NEURONTIN) 300 MG capsule  Oral   Take 1 capsule (300 mg total) by mouth 2 (two) times daily.   180 capsule   3   . HYDROcodone-acetaminophen (NORCO/VICODIN) 5-325 MG per tablet   Oral   Take 1 tablet by mouth every 6 (six) hours as needed.          . insulin detemir (LEVEMIR) 100 UNIT/ML injection   Subcutaneous   Inject 0.12 mLs (12 Units total) into the skin at bedtime.   10 mL      . labetalol (NORMODYNE) 300 MG tablet   Oral   Take 150 mg by mouth 2 (two) times daily.         . Liraglutide (VICTOZA) 18 MG/3ML SOLN injection   Subcutaneous   Inject 0.2 mLs (1.2 mg total) into the skin daily.   6 mL   2     VICTOZA 2-PAK 18MG /3ML PEN, 18/90   . metoCLOPramide (REGLAN) 10 MG tablet   Oral   Take 1 tablet (10 mg total) by mouth daily.   90 tablet   3   .  mycophenolate (CELLCEPT) 250 MG capsule   Oral   Take 500 mg by mouth 2 (two) times daily.          Marland Kitchen omeprazole (PRILOSEC) 20 MG capsule   Oral   Take 20 mg by mouth daily.           . pravastatin (PRAVACHOL) 20 MG tablet   Oral   Take 1 tablet (20 mg total) by mouth daily after supper.   30 tablet   0   . predniSONE (DELTASONE) 5 MG tablet   Oral   Take 5 mg by mouth daily.         Marland Kitchen sulfamethoxazole-trimethoprim (BACTRIM,SEPTRA) 400-80 MG per tablet   Oral   Take 1 tablet by mouth every Monday, Wednesday, and Friday.           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  Summary of this visit's results, reviewed by myself:  Labs:  Results for orders placed during the hospital encounter of 03/14/13 (from the past 24 hour(s))  GLUCOSE, CAPILLARY     Status: Abnormal   Collection Time    03/14/13  9:36 PM      Result Value Range   Glucose-Capillary 102 (*) 70 - 99 mg/dL  POCT I-STAT, CHEM 8     Status: Abnormal   Collection Time     03/14/13  9:42 PM      Result Value Range   Sodium 139  135 - 145 mEq/L   Potassium 4.8  3.5 - 5.1 mEq/L   Chloride 112  96 -  112 mEq/L   BUN 38 (*) 6 - 23 mg/dL   Creatinine, Ser 1.61 (*) 0.50 - 1.35 mg/dL   Glucose, Bld 096 (*) 70 - 99 mg/dL   Calcium, Ion 0.45 (*) 1.13 - 1.30 mmol/L   TCO2 19  0 - 100 mmol/L   Hemoglobin 9.9 (*) 13.0 - 17.0 g/dL   HCT 40.9 (*) 81.1 - 91.4 %  CBC WITH DIFFERENTIAL     Status: Abnormal   Collection Time    03/15/13 12:06 AM      Result Value Range   WBC 4.1  4.0 - 10.5 K/uL   RBC 3.55 (*) 4.22 - 5.81 MIL/uL   Hemoglobin 9.3 (*) 13.0 - 17.0 g/dL   HCT 78.2 (*) 95.6 - 21.3 %   MCV 83.1  78.0 - 100.0 fL   MCH 26.2  26.0 - 34.0 pg   MCHC 31.5  30.0 - 36.0 g/dL   RDW 08.6  57.8 - 46.9 %   Platelets 285  150 - 400 K/uL   Neutrophils Relative 49  43 - 77 %   Neutro Abs 2.0  1.7 - 7.7 K/uL   Lymphocytes Relative 35  12 - 46 %   Lymphs Abs 1.4  0.7 - 4.0 K/uL   Monocytes Relative 13 (*) 3 - 12 %   Monocytes Absolute 0.5  0.1 - 1.0 K/uL   Eosinophils Relative 3  0 - 5 %   Eosinophils Absolute 0.1  0.0 - 0.7 K/uL   Basophils Relative 0  0 - 1 %   Basophils Absolute 0.0  0.0 - 0.1 K/uL  TROPONIN I     Status: None   Collection Time    03/15/13 12:06 AM      Result Value Range   Troponin I <0.30  <0.30 ng/mL    EKG Interpretation:  Date & Time: 03/14/2013 6:26 PM  Rate: 67  Rhythm: normal sinus rhythm  QRS Axis: normal  Intervals: normal  ST/T Wave abnormalities: nonspecific T wave changes  Conduction Disutrbances:none and nonspecific intraventricular conduction delay  Narrative Interpretation:   Old EKG Reviewed: unchanged  EKG Interpretation:  Date & Time: 03/15/2013 12:09 AM  Rate: 104  Rhythm: Ventricular bigeminy  QRS Axis: normal  Intervals: normal  ST/T Wave abnormalities: nonspecific T wave changes  Conduction Disutrbances:none  Narrative Interpretation:   Old EKG Reviewed: Previously normal sinus rhythm  2:15 AM He  continues to have PVCs and intermittent ventricular bigeminy. Will have the hospitalist evaluated him.           Hanley Seamen, MD 03/15/13 [phone]

## 2013-03-15 NOTE — Progress Notes (Signed)
Notified by RN of new CPAP order. Patient has home equipment with nasal mask in the room. He denies the use of humidification at home and does not wish to use it now. He does prefer self placement. Cords are intact and machine is powered via red electrical outlet. Patient is encouraged to call if further assistance is needed.

## 2013-03-15 NOTE — Progress Notes (Signed)
I have seen and assessed patient and agree with Dr Irwin Brakeman assessment and plan.

## 2013-03-15 NOTE — Progress Notes (Signed)
*  PRELIMINARY RESULTS* Vascular Ultrasound Carotid Duplex (Doppler) has been completed.   Right = Atypical flow in ICA demonstrating loss of diastolic component, this could suggest distal obstruction.  Left = no evidence of ICA stenosis.  Antegrade vertebral flow.   Farrel Demark, RDMS, RVT  03/15/2013, 9:03 AM

## 2013-03-15 NOTE — Telephone Encounter (Signed)
FYI: Caller reporting that pt is in hospital d/t syncope, which is believed to be attributed to cardiac issue; request cancel appointment for today 04.29.14 @ 2:30pm, will have New Patient OV scheduled for 05.07.14. Gave patient's spouse [place] cardiology phone number at request/SLS

## 2013-03-15 NOTE — ED Notes (Signed)
MD at bedside. Dr. Le  

## 2013-03-15 NOTE — Progress Notes (Signed)
Inpatient Diabetes Program Recommendations  AACE/ADA: New Consensus Statement on Inpatient Glycemic Control (2013)  Target Ranges:  Prepandial:   less than 140 mg/dL      Peak postprandial:   less than 180 mg/dL (1-2 hours)      Critically ill patients:  140 - 180 mg/dL   Pt ordered Victoza to start today. This would be good if the patient was eating, however, it is documented that patient is eating 0% (?).  If patient truly not eating, please change correction to q 4 hrs until eating.  Once eating change back to tidwc correction and add back the Victoza 1.2 mg. Continue the Levemir at 12 units at [facility] whether eating or not.   Thank you, Lenor Coffin, RN, CNS, Diabetes Coordinator [phone])

## 2013-03-15 NOTE — H&P (Signed)
[facility] History and Physical  [name] WUJ:[id number] DOB: 1951-04-15    PCP:   No primary provider on file.   Chief Complaint: recurrent syncopes  HPI: [name] is an 62 y.o. male with hx of DM2 and autonomic dysfx, hyperlipidemia, neuromuscular disorder, HTN heart disease, recently admitted for syncope with negative work up including negative ECHO and negative stress test, presents to the ER again with another syncopal episode.  Wife stated that she was feeling lightheaded and passed out.  There were no seizure activities, but she noted he had some "tremors" in his left hand and arm, and subsequently had weakness of his left arm.  There were no chest pain, palpitation, vertigo, HA, or any other neurological symptoms.  He did feel lightheadedness upon standing.  It has been more significant this year.  He had been on blood pressure meds, along with a diuretics, but the diuretic was discontinued.  He is still on labetelol.  The other important piece of information is that he has had renal transplant and has been on suppressive therapy including Prednisone at 5mg  per days for years.  Last admission, it was not increased.  In the ER, he has frequent bigeminy rhythms. Hospitalist was asked to admit him for recurrent syncopes.  His wife has been very concerned about these events.  Rewiew of Systems:  Constitutional: Negative for malaise, fever and chills. No significant weight loss or weight gain Eyes: Negative for eye pain, redness and discharge, diplopia, visual changes, or flashes of light. ENMT: Negative for ear pain, hoarseness, nasal congestion, sinus pressure and sore throat. No headaches; tinnitus, drooling, or problem swallowing. Cardiovascular: Negative for chest pain, diaphoresis, dyspnea and peripheral edema. ; No orthopnea, PND Respiratory: Negative for cough, hemoptysis, wheezing and stridor. No pleuritic chestpain. Gastrointestinal: Negative for nausea,  vomiting, diarrhea, constipation, abdominal pain, melena, blood in stool, hematemesis, jaundice and rectal bleeding.    Genitourinary: Negative for frequency, dysuria, incontinence,flank pain and hematuria; Musculoskeletal: Negative for back pain and neck pain. Negative for swelling and trauma.;  Skin: . Negative for pruritus, rash, abrasions, bruising and skin lesion.; ulcerations Neuro: Negative for headache,  and neck stiffness. Negative for weakness, altered level of consciousness , altered mental status, extremity weakness, burning feet, involuntary movement, seizure and syncope.  Psych: negative for anxiety, depression, insomnia, tearfulness, panic attacks, hallucinations, paranoia, suicidal or homicidal ideation    Past Medical History  Diagnosis Date  . Diabetes mellitus with renal manifestations, controlled   . Hypertensive heart disease   . Kidney transplant as cause of abnormal reaction or later complication     In [name]-Dr. Colodonato-Dr. Lawerance Cruel is the Transplant dodctor at [facility]  . Hyperlipidemia   . Chronic kidney disease   . Neuromuscular disorder     Past Surgical History  Procedure Laterality Date  . Kidney transplant      Medications:  HOME MEDS: Prior to Admission medications   Medication Sig Start Date End Date Taking? Authorizing Provider  aspirin EC 81 MG tablet Take 81 mg by mouth every morning.    Yes Historical Provider, MD  capsaicin (ZOSTRIX) 0.025 % cream Apply 1 application topically 4 (four) times daily - after meals and at bedtime.   Yes Historical Provider, MD  cycloSPORINE modified (NEORAL) 100 MG capsule Take 100 mg by mouth 2 (two) times daily. In addition to 50mg  twice daily for total 150 mg twice daily.   Yes Historical Provider, MD  cycloSPORINE modified (NEORAL) 25 MG capsule  Take 50 mg by mouth 2 (two) times daily. In addition to 100mg  po twice daily for total dose of 150mg  twice daily.   Yes Historical Provider, MD  fluticasone  (FLONASE) 50 MCG/ACT nasal spray Place 1 spray into the nose daily as needed. Allergies or sinus congestion.   Yes Historical Provider, MD  gabapentin (NEURONTIN) 300 MG capsule Take 1 capsule (300 mg total) by mouth 2 (two) times daily. 01/10/13  Yes [name], MD  HYDROcodone-acetaminophen (NORCO/VICODIN) 5-325 MG per tablet Take 1 tablet by mouth every 6 (six) hours as needed.  02/07/13  Yes Historical Provider, MD  insulin detemir (LEVEMIR) 100 UNIT/ML injection Inject 0.12 mLs (12 Units total) into the skin at bedtime. 03/08/13  Yes Shanker Levora Dredge, MD  labetalol (NORMODYNE) 300 MG tablet Take 150 mg by mouth 2 (two) times daily.   Yes Historical Provider, MD  Liraglutide (VICTOZA) 18 MG/3ML SOLN injection Inject 0.2 mLs (1.2 mg total) into the skin daily. 03/10/13  Yes [name], MD  metoCLOPramide (REGLAN) 10 MG tablet Take 1 tablet (10 mg total) by mouth daily. 01/10/13  Yes [name], MD  mycophenolate (CELLCEPT) 250 MG capsule Take 500 mg by mouth 2 (two) times daily.    Yes Historical Provider, MD  omeprazole (PRILOSEC) 20 MG capsule Take 20 mg by mouth daily.     Yes Historical Provider, MD  pravastatin (PRAVACHOL) 20 MG tablet Take 1 tablet (20 mg total) by mouth daily after supper. 03/08/13  Yes Shanker Levora Dredge, MD  predniSONE (DELTASONE) 5 MG tablet Take 5 mg by mouth daily.   Yes Historical Provider, MD  sulfamethoxazole-trimethoprim (BACTRIM,SEPTRA) 400-80 MG per tablet Take 1 tablet by mouth every Monday, Wednesday, and Friday.   Yes Historical Provider, MD     Allergies:  Allergies  Allergen Reactions  . Iodine Anaphylaxis    Iv dye  . Shellfish Allergy Anaphylaxis and Swelling    Has throat swelling, tongue swelling, and entire body swells up.     Social History:   reports that he has quit smoking. He has never used smokeless tobacco. He reports that he does not drink alcohol or use illicit drugs.  Family History: Family History  Problem Relation  Age of Onset  . Diabetes Mother   . Diabetes Father   . Kidney failure Brother   . Heart failure Sister      Physical Exam: Filed Vitals:   03/14/13 2127 03/14/13 2256 03/15/13 0234 03/15/13 0342  BP: 165/72 159/72 143/82 180/71  Pulse: 70   75  Temp:  98.7 F (37.1 C) 97.9 F (36.6 C)   TempSrc:  Oral Oral   Resp:  13 22 18   Height:    6\' 1"  (1.854 m)  Weight:    114 kg (251 lb 5.2 oz)  SpO2:  100% 100% 100%   Blood pressure 180/71, pulse 75, temperature 97.9 F (36.6 C), temperature source Oral, resp. rate 18, height 6\' 1"  (1.854 m), weight 114 kg (251 lb 5.2 oz), SpO2 100.00%.  GEN:  Pleasant  patient lying in the stretcher in no acute distress; cooperative with exam. PSYCH:  alert and oriented x4; does not appear anxious or depressed; affect is appropriate. HEENT: Mucous membranes pink and anicteric; PERRLA; EOM intact; no cervical lymphadenopathy nor thyromegaly or carotid bruit; no JVD; There were no stridor. Neck is very supple. Breasts:: Not examined CHEST WALL: No tenderness CHEST: Normal respiration, clear to auscultation bilaterally.  HEART: Regular rate and rhythm.  There  are no murmur, rub, or gallops.   BACK: No kyphosis or scoliosis; no CVA tenderness ABDOMEN: soft and non-tender; no masses, no organomegaly, normal abdominal bowel sounds; no pannus; no intertriginous candida. There is no rebound and no distention. Rectal Exam: Not done EXTREMITIES: No bone or joint deformity; age-appropriate arthropathy of the hands and knees; no edema; no ulcerations.  There is no calf tenderness. Genitalia: not examined PULSES: 2+ and symmetric SKIN: Normal hydration no rash or ulceration CNS: Cranial nerves 2-12 grossly intact no focal lateralizing neurologic deficit.  Speech is fluent; uvula elevated with phonation, facial symmetry and tongue midline. DTR are normal bilaterally, cerebella exam is intact, barbinski is negative and strengths are equaled bilaterally.  No sensory  loss.   Labs on Admission:  Basic Metabolic Panel:  Recent Labs Lab 03/08/13 0510 03/14/13 2142  NA 136 139  K 3.9 4.8  CL 103 112  CO2 23  --   GLUCOSE 77 110*  BUN 27* 38*  CREATININE 1.66* 2.40*  CALCIUM 9.0  --    Liver Function Tests: No results found for this basename: AST, ALT, ALKPHOS, BILITOT, PROT, ALBUMIN,  in the last 168 hours No results found for this basename: LIPASE, AMYLASE,  in the last 168 hours No results found for this basename: AMMONIA,  in the last 168 hours CBC:  Recent Labs Lab 03/14/13 2142 03/15/13 0006  WBC  --  4.1  NEUTROABS  --  2.0  HGB 9.9* 9.3*  HCT 29.0* 29.5*  MCV  --  83.1  PLT  --  285   Cardiac Enzymes:  Recent Labs Lab 03/15/13 0006  TROPONINI <0.30    CBG:  Recent Labs Lab 03/08/13 0738 03/08/13 1353 03/14/13 2136  GLUCAP 77 87 102*     Radiological Exams on Admission: No results found.  Assessment/Plan Present on Admission:  . Syncope . Orthostatic syncope . Diabetetic peripheral neuropathy . Anemia of chronic disease . Action tremor . Obesity (BMI 30-39.9)  PLAN:  I suspect he does have autonomic dysfx from DM2, worsen with BP medication and diuretics.  Now that he is off diuretics, will allow a bit higher BP so he won't faint.  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  I am not sure about the tremor and the left arm weakness, but his MRI of the brain was negative last admission.  I will obtain an EEG but I am certain the yield is low.  He should continue with his ted hose, and consider fluorinef.  His bigeminy rhythm is benign, and he is already on betablocker, so I don't think that is what caused his syncope.  Will admit him for further monitoring and adjusting his medication as mentioned above.  Thank you for allowing me to partake in the care of your nice patient.  Other plans as per orders.  Code  Status: FULL Unk Lightning, MD. [facility] Pager [phone] 7pm to 7am.  03/15/2013, 5:05 AM

## 2013-03-15 NOTE — Progress Notes (Signed)
Patient is currently active with [facility] for chronic disease management services.  Patient has been engaged by a [employer].  Patient will receive a post discharge transition of care call and will be evaluated for monthly home visits for assessments and disease process education. Of note, [facility] services does not replace or interfere with any services that are arranged by inpatient case management or social work.  For additional questions or referrals please contact Anibal Henderson BSN RN [facility] Liaison at [phone].

## 2013-03-16 ENCOUNTER — Inpatient Hospital Stay (HOSPITAL_COMMUNITY)
Admit: 2013-03-16 | Discharge: 2013-03-16 | Disposition: A | Discharge status: Home / Self Care | Payer: Medicare HMO | Attending: Internal Medicine | Admitting: Internal Medicine

## 2013-03-16 ENCOUNTER — Other Ambulatory Visit: Payer: Self-pay

## 2013-03-16 DIAGNOSIS — D638 Anemia in other chronic diseases classified elsewhere: Secondary | ICD-10-CM

## 2013-03-16 LAB — BASIC METABOLIC PANEL
BUN: 36 mg/dL — ABNORMAL HIGH (ref 6–23)
Calcium: 9 mg/dL (ref 8.4–10.5)
Creatinine, Ser: 1.72 mg/dL — ABNORMAL HIGH (ref 0.50–1.35)
GFR calc non Af Amer: 41 mL/min — ABNORMAL LOW (ref >90)
Glucose, Bld: 166 mg/dL — ABNORMAL HIGH (ref 70–99)

## 2013-03-16 LAB — CBC
MCH: 25.5 pg — ABNORMAL LOW (ref 26.0–34.0)
MCHC: 31.1 g/dL (ref 30.0–36.0)
Platelets: 286 10*3/uL (ref 150–400)

## 2013-03-16 LAB — GLUCOSE, CAPILLARY
Glucose-Capillary: 105 mg/dL — ABNORMAL HIGH (ref 70–99)
Glucose-Capillary: 176 mg/dL — ABNORMAL HIGH (ref 70–99)

## 2013-03-16 MED ORDER — CLONIDINE HCL 0.1 MG PO TABS
0.1000 mg | ORAL_TABLET | Freq: Once | ORAL | Status: AC
  Administered 2013-03-16: 0.1 mg via ORAL
  Filled 2013-03-16: qty 1

## 2013-03-16 NOTE — Progress Notes (Signed)
OT Cancellation Note  Patient Details Name: [name] MRN: [id number] DOB: 06/08/1951   Cancelled Treatment:    Reason Eval/Treat Not Completed: Other (comment) (pt just returned to bed and with pain. requested to rest)  Lennox Laity [phone] 03/16/2013, 12:28 PM

## 2013-03-16 NOTE — Progress Notes (Signed)
Upon pt giving himself bed bath pt with significant tremors noted. RN at bedside to evaluate pt status. Pt was still alert and oriented x 4 and able to make needs known. Pt denied any pain or distress. Pt denies any chest pain or sob. Pt vitals winl. Will continue to monitor. On called and informed.

## 2013-03-16 NOTE — Progress Notes (Signed)
[facility] PROGRESS NOTE  Auburn Hert Guidice YNW:[id number] DOB: Mar 17, 1951 DOA: 03/14/2013 PCP: No primary provider on file.  Assessment/Plan: Syncope . Orthostatic syncope: probably from orthostatic hypotension. He was given iv steroids, fluids, compression stockings and is on florinef. He had a recent stress test did not show any reversible ischemia. His LVEF is 53.%.  Undergoing EEG today.  . Diabetetic peripheral neuropathy; resume home medications.  . Anemia of chronic disease;s table . Action tremor; going on for 2 to 3 months. Will request neurology for further recommendations.  . Obesity (BMI 30-39.9) DVT prophylaxis.  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   Diabetes mellitus:  CBG (last 3)   Recent Labs  03/15/13 1630 03/15/13 2231 03/16/13 0752  GLUCAP 201* 146* 126*    Resume home medications and SSI .   Code Status: full code Family Communication: none at bedside Disposition Plan: pending    Consultants:  None. HPI/Subjective: An episode of tremors this am.  Objective: Filed Vitals:   03/15/13 1355 03/15/13 1946 03/15/13 2125 03/16/13 0600  BP: 183/94 175/82  170/77  Pulse: 67 72 77 80  Temp: 98.3 F (36.8 C) 98.6 F (37 C)  98.2 F (36.8 C)  TempSrc: Oral Oral  Oral  Resp: 18 16 17 18   Height:      Weight:      SpO2: 100% 99% 98% 96%    Intake/Output Summary (Last 24 hours) at 03/16/13 1045 Last data filed at 03/16/13 0544  Gross per 24 hour  Intake 1633.75 ml  Output   2150 ml  Net -516.25 ml   Filed Weights   03/15/13 0342  Weight: 114 kg (251 lb 5.2 oz)    Exam: Alert afebrile comfortable.  CHEST: Normal respiration, clear to auscultation bilaterally.  HEART: Regular rate and rhythm. There are no murmur, rub, or gallops.  BACK:  no CVA tenderness  ABDOMEN: soft and non-tender; no masses, no organomegaly, normal abdominal bowel sounds; no pannus; no intertriginous candida. There is no rebound and no distention.   EXTREMITIES:trace edema.   Data Reviewed: Basic Metabolic Panel:  Recent Labs Lab 03/14/13 2142 03/15/13 0500 03/16/13 0515  NA 139 137 136  K 4.8 4.4 4.4  CL 112 104 103  CO2  --  19 21  GLUCOSE 110* 182* 166*  BUN 38* 35* 36*  CREATININE 2.40* 2.13*  2.14* 1.72*  CALCIUM  --  8.9 9.0   Liver Function Tests: No results found for this basename: AST, ALT, ALKPHOS, BILITOT, PROT, ALBUMIN,  in the last 168 hours No results found for this basename: LIPASE, AMYLASE,  in the last 168 hours No results found for this basename: AMMONIA,  in the last 168 hours CBC:  Recent Labs Lab 03/14/13 2142 03/15/13 0006 03/15/13 0500 03/16/13 0515  WBC  --  4.1 4.5 4.8  NEUTROABS  --  2.0  --   --   HGB 9.9* 9.3* 9.6* 9.4*  HCT 29.0* 29.5* 30.8* 30.2*  MCV  --  83.1 83.7 82.1  PLT  --  285 294 286   Cardiac Enzymes:  Recent Labs Lab 03/15/13 0006  TROPONINI <0.30   BNP (last 3 results)  Recent Labs  12/01/12 0943 03/06/13 0100  PROBNP 4046.0* 761.9*   CBG:  Recent Labs Lab 03/15/13 0724 03/15/13 1139 03/15/13 1630 03/15/13 2231 03/16/13 0752  GLUCAP 189* 239* 201* 146* 126*    No results found for this or any previous visit (from the past 240 hour(s)).  Studies: No results found.  Scheduled Meds: . aspirin EC  81 mg Oral q morning - 10a  . cycloSPORINE modified  100 mg Oral BID  . cycloSPORINE modified  50 mg Oral BID  . fluticasone  1 spray Each Nare Daily  . gabapentin  300 mg Oral BID  . heparin  5,000 Units Subcutaneous Q8H  . insulin aspart  0-9 Units Subcutaneous TID WC  . insulin detemir  12 Units Subcutaneous QHS  . labetalol  150 mg Oral BID  . Liraglutide  1.2 mg Subcutaneous Daily  . mycophenolate  500 mg Oral BID  . pantoprazole  40 mg Oral Daily  . pravastatin  20 mg Oral QPC supper  . predniSONE  20 mg Oral QAC breakfast  . sodium chloride  3 mL Intravenous Q12H  . sulfamethoxazole-trimethoprim  1 tablet Oral Q M,W,F   Continuous  Infusions:   Active Problems:   Diabetetic peripheral neuropathy   History of renal transplant   Orthostatic syncope   Anemia of chronic disease   Syncope   Obesity (BMI 30-39.9)   Action tremor        Mory Herrman  [facility] Pager [phone]. If 7PM-7AM, please contact night-coverage at [email or web address], password [facility] 03/16/2013, 10:45 AM  LOS: 2 days

## 2013-03-16 NOTE — Progress Notes (Signed)
EEG completed.

## 2013-03-16 NOTE — Progress Notes (Signed)
MD text paged about pt BP 203/86 pulse 73 pt asymptomatic. Will continue to monitor.

## 2013-03-16 NOTE — Progress Notes (Addendum)
PT Cancellation Note  Patient Details Name: [name] MRN: [id number] DOB: [birth year]   Cancelled Treatment:    Reason Eval/Treat Not Completed: Fatigue/lethargy limiting ability to participate at 950 pt had just gotten himself back to bed and declined at the time to try to work with PT.   Rada Hay 03/16/2013, 4:00 PM Blanchard Kelch PT [phone]

## 2013-03-17 ENCOUNTER — Inpatient Hospital Stay (HOSPITAL_COMMUNITY): Payer: Medicare HMO

## 2013-03-17 LAB — GLUCOSE, CAPILLARY
Glucose-Capillary: 123 mg/dL — ABNORMAL HIGH (ref 70–99)
Glucose-Capillary: 140 mg/dL — ABNORMAL HIGH (ref 70–99)

## 2013-03-17 LAB — D-DIMER, QUANTITATIVE: D-Dimer, Quant: 2.57 ug/mL-FEU — ABNORMAL HIGH (ref 0.00–0.48)

## 2013-03-17 MED ORDER — ENOXAPARIN SODIUM 120 MG/0.8ML ~~LOC~~ SOLN
115.0000 mg | Freq: Two times a day (BID) | SUBCUTANEOUS | Status: DC
  Administered 2013-03-17 – 2013-03-20 (×6): 115 mg via SUBCUTANEOUS
  Filled 2013-03-17 (×7): qty 0.8

## 2013-03-17 NOTE — Progress Notes (Signed)
[facility] PROGRESS NOTE  [name] ZOX:[id number] DOB: 01/18/1951 DOA: 03/14/2013 PCP: Dr [name]  Assessment/Plan: Syncope . Orthostatic syncope: probably from orthostatic hypotension, but i do not see any documentation of orthostatic blood pressures on admission. He was given iv steroids, fluids, compression stockings. He had a recent stress test did not show any reversible ischemia. His LVEF is 53.%. On admission he had an EKG done which ventricular bigeminy. Repeat EKG last night showed NSR with LVH hypertrophy. His orthostatic blood pressures were positive yesterday evening and I had a lengthy discussion with the family yesterday that these recurrent syncope could be secondary to autonomic dysfunction from long standing Diabetes Mellitus. Family requested to see Dr [name] his cardiologist from prior hospitalization.  willr equest Dr [name] to talk to the family.  Of Note EEG was done to evaluate for seizures, showed generalized slowing and no seizure activity.  . Diabetetic peripheral neuropathy; resume home medications.  . Anemia of chronic disease;s table . 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 We have stopped reglan and will watch him for 24 hours for recurrent tremors.  . Obesity (BMI 30-39.9) DVT prophylaxis.  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   Diabetes mellitus:  CBG (last 3)   Recent Labs  03/16/13 1720 03/16/13 2131 03/17/13 0747  GLUCAP 162* 176* 123*    Resume home medications and SSI .   Code Status: full code Family Communication: none at bedside Disposition Plan: pending    Consultants:  None. HPI/Subjective: An episode of tremors this am.  Objective: Filed Vitals:   03/16/13 1745 03/16/13 2200 03/16/13 2300 03/17/13 0600  BP: 155/76 204/89 203/86 186/89  Pulse: 83 73  74  Temp:  98.3 F (36.8 C)  98.5 F (36.9 C)  TempSrc:  Oral  Oral  Resp:  18  18  Height:      Weight:      SpO2:  100%  99%     Intake/Output Summary (Last 24 hours) at 03/17/13 1042 Last data filed at 03/17/13 0952  Gross per 24 hour  Intake    240 ml  Output   1150 ml  Net   -910 ml   Filed Weights   03/15/13 0342  Weight: 114 kg (251 lb 5.2 oz)    Exam: Alert afebrile comfortable.  CHEST: Normal respiration, clear to auscultation bilaterally.  HEART: Regular rate and rhythm. There are no murmur, rub, or gallops.  BACK:  no CVA tenderness  ABDOMEN: soft and non-tender; no masses, no organomegaly, normal abdominal bowel sounds; no pannus; no intertriginous candida. There is no rebound and no distention.  EXTREMITIES:trace edema.   Data Reviewed: Basic Metabolic Panel:  Recent Labs Lab 03/14/13 2142 03/15/13 0500 03/16/13 0515  NA 139 137 136  K 4.8 4.4 4.4  CL 112 104 103  CO2  --  19 21  GLUCOSE 110* 182* 166*  BUN 38* 35* 36*  CREATININE 2.40* 2.13*  2.14* 1.72*  CALCIUM  --  8.9 9.0   Liver Function Tests: No results found for this basename: AST, ALT, ALKPHOS, BILITOT, PROT, ALBUMIN,  in the last 168 hours No results found for this basename: LIPASE, AMYLASE,  in the last 168 hours No results found for this basename: AMMONIA,  in the last 168 hours CBC:  Recent Labs Lab 03/14/13 2142 03/15/13 0006 03/15/13 0500 03/16/13 0515  WBC  --  4.1 4.5 4.8  NEUTROABS  --  2.0  --   --  HGB 9.9* 9.3* 9.6* 9.4*  HCT 29.0* 29.5* 30.8* 30.2*  MCV  --  83.1 83.7 82.1  PLT  --  285 294 286   Cardiac Enzymes:  Recent Labs Lab 03/15/13 0006  TROPONINI <0.30   BNP (last 3 results)  Recent Labs  12/01/12 0943 03/06/13 0100  PROBNP 4046.0* 761.9*   CBG:  Recent Labs Lab 03/16/13 0752 03/16/13 1233 03/16/13 1720 03/16/13 2131 03/17/13 0747  GLUCAP 126* 105* 162* 176* 123*    No results found for this or any previous visit (from the past 240 hour(s)).   Studies: No results found.  Scheduled Meds: . aspirin EC  81 mg Oral q morning - 10a  . cycloSPORINE modified   100 mg Oral BID  . cycloSPORINE modified  50 mg Oral BID  . fluticasone  1 spray Each Nare Daily  . gabapentin  300 mg Oral BID  . heparin  5,000 Units Subcutaneous Q8H  . insulin aspart  0-9 Units Subcutaneous TID WC  . insulin detemir  12 Units Subcutaneous QHS  . labetalol  150 mg Oral BID  . Liraglutide  1.2 mg Subcutaneous Daily  . mycophenolate  500 mg Oral BID  . pantoprazole  40 mg Oral Daily  . pravastatin  20 mg Oral QPC supper  . sodium chloride  3 mL Intravenous Q12H  . sulfamethoxazole-trimethoprim  1 tablet Oral Q M,W,F   Continuous Infusions:   Active Problems:   Diabetetic peripheral neuropathy   History of renal transplant   Orthostatic syncope   Anemia of chronic disease   Syncope   Obesity (BMI 30-39.9)   Action tremor        [name]  [facility] Pager [phone]. If 7PM-7AM, please contact night-coverage at [email or web address], password [facility] 03/17/2013, 10:42 AM  LOS: 3 days

## 2013-03-17 NOTE — Consult Note (Signed)
Cardiology Consult Note  Admit date: 03/14/2013 Name: [name] Imm 62 y.o.  male DOB:  Jul 07, 1951 MRN:  [id number]  Today's date:  03/17/2013  Referring Physician:    [facility]  Reason for Consultation:   Recurrent syncope  IMPRESSIONS: 1. Episode of recurrent syncope that could have been vasovagal or could be orthostatic in nature or due to autonomic dysfunction and autonomic failure 2. Hypertensive heart disease with uncontrolled hypertension 3. Diabetes mellitus with retinopathy, neuropathy, gastropathy and nephropathy 4. Hyperlipidemia under treatment 5. Tremor possibly due to Reglan 6. Chronic kidney disease stage III-4 7. Prior renal transplant  RECOMMENDATION: The patient has had recurrent syncope at home. He was quite sedentary at home and did not feel well and has had worsening of his renal function since he went home. This has slowly improved and his blood pressure is improving now. He has not had definite orthostatic since he has been discharged.  At this point I doubt the syncopal was arrhythmic but it would be reasonable to wear an event monitor as an outpatient. He did not have any arrhythmias on telemetry and a previous Lexiscan test was unremarkable.  Mestinon may be useful to manage orthostasis and may allow  blunting of his orthostasis so that he could be treated with antihypertensive medicines as his blood pressure is still quite high when he is supine.  He is to continue to wear support stockings, fluid load, and avoid dehydration. He should have orthostatic blood pressures done every shift or tickly when he is standing.  HISTORY: This 62 year old male was seen by me as a new patient about 2 weeks ago. He had profound orthostatic hypotension and had had recurrent syncope. He has had recurrent urosepsis and had a negative Cardiolite during that admission. He was discharged home the day I did the Cardiolite on him and according to the wife basically went  home did not feel well of late in the bed all the time that week. He was able to get up a time or 2 and when the therapist came to work with him on Monday he was having significant tremor that has been attributed to Reglan. While the therapist was working with him he began to have severe pain in his knee and complained of severe shaking and then had a syncopal episode and laid back in the chair. He was sitting up when this happened. He had another vague episode while the therapist was working with him this admission. He was taken off of Reglan this admission. He does not have any chest pain suggestive of angina. His blood pressure is been quite high since he has been admitted. He has been given an increased dose of prednisone and his Reglan has been discontinued.  Past Medical History  Diagnosis Date  . Diabetes mellitus with renal manifestations, controlled   . Hypertensive heart disease   . Kidney transplant as cause of abnormal reaction or later complication     In [name]-Dr. Colodonato-Dr. Lawerance Cruel is the Transplant dodctor at [facility]  . Hyperlipidemia   . Chronic kidney disease   . Neuromuscular disorder       Past Surgical History  Procedure Laterality Date  . Kidney transplant       Allergies:  is allergic to iodine and shellfish allergy.   Medications: Prior to Admission medications   Medication Sig Start Date End Date Taking? Authorizing Provider  aspirin EC 81 MG tablet Take 81 mg by mouth every morning.    Yes Historical  Provider, MD  capsaicin (ZOSTRIX) 0.025 % cream Apply 1 application topically 4 (four) times daily - after meals and at bedtime.   Yes Historical Provider, MD  cycloSPORINE modified (NEORAL) 100 MG capsule Take 100 mg by mouth 2 (two) times daily. In addition to 50mg  twice daily for total 150 mg twice daily.   Yes Historical Provider, MD  cycloSPORINE modified (NEORAL) 25 MG capsule Take 50 mg by mouth 2 (two) times daily. In addition to 100mg  po twice daily  for total dose of 150mg  twice daily.   Yes Historical Provider, MD  fluticasone (FLONASE) 50 MCG/ACT nasal spray Place 1 spray into the nose daily as needed. Allergies or sinus congestion.   Yes Historical Provider, MD  gabapentin (NEURONTIN) 300 MG capsule Take 1 capsule (300 mg total) by mouth 2 (two) times daily. 01/10/13  Yes [name], MD  HYDROcodone-acetaminophen (NORCO/VICODIN) 5-325 MG per tablet Take 1 tablet by mouth every 6 (six) hours as needed.  02/07/13  Yes Historical Provider, MD  insulin detemir (LEVEMIR) 100 UNIT/ML injection Inject 0.12 mLs (12 Units total) into the skin at bedtime. 03/08/13  Yes Shanker Levora Dredge, MD  labetalol (NORMODYNE) 300 MG tablet Take 150 mg by mouth 2 (two) times daily.   Yes Historical Provider, MD  Liraglutide (VICTOZA) 18 MG/3ML SOLN injection Inject 0.2 mLs (1.2 mg total) into the skin daily. 03/10/13  Yes [name], MD  metoCLOPramide (REGLAN) 10 MG tablet Take 1 tablet (10 mg total) by mouth daily. 01/10/13  Yes [name], MD  mycophenolate (CELLCEPT) 250 MG capsule Take 500 mg by mouth 2 (two) times daily.    Yes Historical Provider, MD  omeprazole (PRILOSEC) 20 MG capsule Take 20 mg by mouth daily.     Yes Historical Provider, MD  pravastatin (PRAVACHOL) 20 MG tablet Take 1 tablet (20 mg total) by mouth daily after supper. 03/08/13  Yes Shanker Levora Dredge, MD  predniSONE (DELTASONE) 5 MG tablet Take 5 mg by mouth daily.   Yes Historical Provider, MD  sulfamethoxazole-trimethoprim (BACTRIM,SEPTRA) 400-80 MG per tablet Take 1 tablet by mouth every Monday, Wednesday, and Friday.   Yes Historical Provider, MD    Family History: Family Status  Relation Status Death Age  . Father Deceased 69    died of MI  . Mother Deceased 58    died of comps of diabetes  . Sister Deceased     died of comps of diabetes    Social History:   reports that he has quit smoking. He has never used smokeless tobacco. He reports that he does not drink  alcohol or use illicit drugs.   History   Social History Narrative   Retired and on disability-since 1999 for Renal failure   Is originally from [place] for 38 yrs-came back here in 2002   Use dto smoke and drink only until age of 25 or 4 yrs of age   Past Marijuana user     Review of Systems: He has significant diabetic gastroparesis and has been on Reglan but this is thought to cause significant tremor. He is quite weak and has some issues with depression. He does not have shortness of breath and does not have chest pain suggestive of angina. He does have significant peripheral neuropathy and uses Neurontin. He has severe knee pain and is awaiting a knee replacement on the left. He has had no recent nausea or vomiting. Other than as noted above the remainder of the review of systems is  unremarkable.  Physical Exam: BP 177/76  Pulse 67  Temp(Src) 98 F (36.7 C) (Oral)  Resp 16  Ht 6\' 1"  (1.854 m)  Wt 114 kg (251 lb 5.2 oz)  BMI 33.17 kg/m2  SpO2 100%  BP 177/76  Pulse 67  Temp(Src) 98 F (36.7 C) (Oral)  Resp 16  Ht 6\' 1"  (1.854 m)  Wt 114 kg (251 lb 5.2 oz)  BMI 33.17 kg/m2  SpO2 100% General appearance: alert, cooperative, appears stated age and no distress Head: Normocephalic, without obvious abnormality, atraumatic Eyes: conjunctivae/corneas clear. PERRL, EOM's intact. Fundi not examined Neck: no adenopathy, no carotid bruit, no JVD and supple, symmetrical, trachea midline Lungs: clear to auscultation bilaterally Heart: regular rate and rhythm, S1, S2 normal, no murmur, click, rub or gallop Abdomen: soft, non-tender; bowel sounds normal; no masses,  no organomegaly Rectal: deferred Extremities: Wearing support stockings, trace edema, limitation of motion of left knee Pulses: 2+ and symmetric Skin: Skin color, texture, turgor normal. No rashes or lesions Neurologic: Grossly normal  Labs: CBC  Recent Labs  03/15/13 0006  03/16/13 0515  WBC 4.1  < > 4.8  RBC 3.55*   < > 3.68*  HGB 9.3*  < > 9.4*  HCT 29.5*  < > 30.2*  PLT 285  < > 286  MCV 83.1  < > 82.1  MCH 26.2  < > 25.5*  MCHC 31.5  < > 31.1  RDW 13.6  < > 13.6  LYMPHSABS 1.4  --   --   MONOABS 0.5  --   --   EOSABS 0.1  --   --   BASOSABS 0.0  --   --   < > = values in this interval not displayed. CMP   Recent Labs  03/16/13 0515  NA 136  K 4.4  CL 103  CO2 21  GLUCOSE 166*  BUN 36*  CREATININE 1.72*  CALCIUM 9.0  GFRNONAA 41*  GFRAA 47*   BNP (last 3 results)  Recent Labs  12/01/12 0943 03/06/13 0100  PROBNP 4046.0* 761.9*   Cardiac Panel (last 3 results)  Recent Labs  03/15/13 0006  TROPONINI <0.30     Radiology:   Cardiomegaly with clear lung fields EKG: Sinus rhythm, minor nonspecific changes in the inferior leads, voltage for LVH.  Signed:  Darden Palmer MD [facility]   Cardiology Consultant  03/17/2013, 6:30 PM

## 2013-03-17 NOTE — Progress Notes (Signed)
ANTICOAGULATION CONSULT NOTE - Initial Consult  Pharmacy Consult for enoxaparin Indication: rule out PE  Allergies  Allergen Reactions  . Iodine Anaphylaxis    Iv dye  . Shellfish Allergy Anaphylaxis and Swelling    Has throat swelling, tongue swelling, and entire body swells up.     Patient Measurements: Height: 6\' 1"  (185.4 cm) Weight: 251 lb 5.2 oz (114 kg) IBW/kg (Calculated) : 79.9 Heparin Dosing Weight:   Vital Signs: Temp: 98 F (36.7 C) (05/01 1515) Temp src: Oral (05/01 1515) BP: 177/76 mmHg (05/01 1515) Pulse Rate: 67 (05/01 1515)  Labs:  Recent Labs  03/14/13 2142 03/15/13 0006 03/15/13 0500 03/16/13 0515  HGB 9.9* 9.3* 9.6* 9.4*  HCT 29.0* 29.5* 30.8* 30.2*  PLT  --  285 294 286  CREATININE 2.40*  --  2.13*  2.14* 1.72*  TROPONINI  --  <0.30  --   --     Estimated Creatinine Clearance: 58.1 ml/min (by C-G formula based on Cr of 1.72).   Medical History: Past Medical History  Diagnosis Date  . Diabetes mellitus with renal manifestations, controlled   . Hypertensive heart disease   . Kidney transplant as cause of abnormal reaction or later complication     In [name]-Dr. Colodonato-Dr. Lawerance Cruel is the Transplant dodctor at [facility]  . Hyperlipidemia   . Chronic kidney disease   . Neuromuscular disorder     Assessment: 57 YOM with recurrent syncope, D-dimer is elevated and concern for PE pending VQ scan. History of CKD with renal transplant  CBC: Hgb=9.4, plts = 286  SCr=1.72 for est CrCl = 55ml/min  Goal of Therapy:  Anti-Xa level 0.6-1.2 units/ml 4hrs after LMWH dose given Monitor platelets by anticoagulation protocol: Yes   Plan:   Start enoxaparin 115 mg SQ q12h pending VQ scan results  Watch renal fx closely and adjust dose/therapy as appropriate  CBC q72h while on enoxaparin in hospital  Dannielle Huh 03/17/2013,9:01 PM

## 2013-03-17 NOTE — Progress Notes (Signed)
MD notified pt BP 194/73 one hour after one time clonidine dose (see MAR).  Pt asymptomatic. Will continue to monitor.

## 2013-03-17 NOTE — Evaluation (Signed)
Occupational Therapy Evaluation Patient Details Name: [name] MRN: [id number] DOB: [birth year] Today's Date: 03/17/2013 Time: 4098-1191 OT Time Calculation (min): 28 min  OT Assessment / Plan / Recommendation Clinical Impression  Pt admitted with orthostatic syncope.  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  Knees make it difficulty for him to rise from low surfaces such as the toilet.  Recommend 3 in1 for home safety.  Pt's ADL functioning impeded primarily by medical issues.  Will need supervision at home, no further OT.    OT Assessment  Patient does not need any further OT services    Follow Up Recommendations  Supervision/Assistance - 24 hour    Barriers to Discharge      Equipment Recommendations  3 in 1 bedside comode    Recommendations for Other Services    Frequency       Precautions / Restrictions Precautions Precautions: Fall Restrictions Weight Bearing Restrictions: No   Pertinent Vitals/Pain     ADL  Eating/Feeding: Independent Where Assessed - Eating/Feeding: Chair Grooming: Wash/dry hands;Min guard Where Assessed - Grooming: Unsupported standing Upper Body Bathing: Set up Where Assessed - Upper Body Bathing: Unsupported sitting Lower Body Bathing: Min guard Where Assessed - Lower Body Bathing: Unsupported sitting;Supported sit to stand Upper Body Dressing: Set up Where Assessed - Upper Body Dressing: Unsupported sitting Lower Body Dressing: Min guard Where Assessed - Lower Body Dressing: Unsupported sitting;Supported sit to stand Toilet Transfer: Min Pension scheme manager Method: Surveyor, minerals:  (to recliner) Equipment Used: Gait belt;Rolling walker Transfers/Ambulation Related to ADLs: min guard with RW due  ADL Comments: Pt primarily limited by medical issues.    OT Diagnosis:    OT Problem List:   OT Treatment Interventions:     OT Goals    Visit  Information  Last OT Received On: 03/17/13 Assistance Needed: +1    Subjective Data  Subjective: "I've been in the hospital 8 times since January." Patient Stated Goal: Home with wife, resolve tremor, knee pain, and syncope.   Prior Functioning     Home Living Lives With: Spouse Available Help at Discharge: Family Type of Home: Apartment Home Access: Level entry Home Layout: One level Bathroom Shower/Tub: Health visitor: Standard Bathroom Accessibility: Yes How Accessible: Accessible via walker Home Adaptive Equipment: Dan Humphreys - four wheeled;Shower chair with back Additional Comments: Pt has difficulty rising from low surfaces, could benefit from 3 in1. Prior Function Level of Independence: Independent with assistive device(s) Driving: No Vocation: On disability Communication Communication: No difficulties Dominant Hand: Right         Vision/Perception Vision - History Baseline Vision: Wears glasses only for reading Patient Visual Report: No change from baseline   Cognition  Cognition Arousal/Alertness: Awake/alert Behavior During Therapy: WFL for tasks assessed/performed Overall Cognitive Status: Within Functional Limits for tasks assessed    Extremity/Trunk Assessment Right Upper Extremity Assessment RUE ROM/Strength/Tone: WFL for tasks assessed RUE Coordination: WFL - gross/fine motor Left Upper Extremity Assessment LUE ROM/Strength/Tone: WFL for tasks assessed (reports occasional tremor, not detected this visit) LUE Coordination: WFL - gross/fine motor     Mobility Bed Mobility Bed Mobility: Not assessed Transfers Transfers: Sit to Stand;Stand to Sit Sit to Stand: 4: Min guard;With upper extremity assist;From chair/3-in-1 Stand to Sit: 4: Min guard;With upper extremity assist;To chair/3-in-1 Details for Transfer Assistance: used momentum for sit to stand, UEs to control descent     Exercise  Balance Balance Balance Assessed:  Yes Static Standing Balance Static Standing - Balance Support: Left upper extremity supported Static Standing - Level of Assistance: 5: Stand by assistance Static Standing - Comment/# of Minutes: 1   End of Session OT - End of Session Activity Tolerance: Patient tolerated treatment well Patient left: in chair;with call bell/phone within reach;with family/visitor present  GO     Evern Bio 03/17/2013, 2:23 PM [phone]

## 2013-03-17 NOTE — Evaluation (Addendum)
Physical Therapy Evaluation Patient Details Name: [name] MRN: [id number] DOB: 1951-03-27 Today's Date: 03/17/2013 Time: 2130-8657 PT Time Calculation (min): 20 min  PT Assessment / Plan / Recommendation Clinical Impression  pt adm for syncope; was here one week ago for same in addition to bil knee pain, currently L knee worse than R per pt; Pt limited this am due to being orthostatic; BP sitting 169/81, 136/80; Pt felt as if he was going to pass out; Recovered in siting. will continue to follow for PT to improve independence and safety for functional mobility;    PT Assessment  Patient needs continued PT services    Follow Up Recommendations  Home health PT    Does the patient have the potential to tolerate intense rehabilitation      Barriers to Discharge None      Equipment Recommendations  None recommended by PT (pt has 4wh walker; doesn't want 2 wh walker)    Recommendations for Other Services     Frequency Min 3X/week    Precautions / Restrictions Precautions Precautions: Fall Restrictions Weight Bearing Restrictions: No   Pertinent Vitals/Pain       Mobility  Bed Mobility Bed Mobility: Not assessed Transfers Transfers: Sit to Stand;Stand to Sit Sit to Stand: 4: Min guard;5: Supervision Stand to Sit: 4: Min guard Details for Transfer Assistance: cues for hand placement Ambulation/Gait Ambulation/Gait Assistance: Other (comment);Not tested (comment) (unable to test due to orthostasis)    Exercises General Exercises - Lower Extremity Long Arc Quad: AROM;Both;10 reps Heel Raises: AROM;10 reps;Standing   PT Diagnosis: Difficulty walking  PT Problem List: Decreased strength;Decreased range of motion;Decreased activity tolerance;Decreased balance;Decreased knowledge of use of DME;Decreased mobility PT Treatment Interventions: DME instruction;Gait training;Functional mobility training;Therapeutic activities;Therapeutic exercise;Balance  training;Patient/family education   PT Goals Acute Rehab PT Goals PT Goal Formulation: With patient Time For Goal Achievement: 03/17/13 Potential to Achieve Goals: Good Pt will go Supine/Side to Sit: with modified independence PT Goal: Supine/Side to Sit - Progress: Goal set today Pt will go Sit to Supine/Side: with modified independence PT Goal: Sit to Supine/Side - Progress: Goal set today Pt will go Sit to Stand: with supervision PT Goal: Sit to Stand - Progress: Goal set today Pt will go Stand to Sit: with supervision PT Goal: Stand to Sit - Progress: Goal set today Pt will Ambulate: 51 - 150 feet;with supervision;with rolling walker PT Goal: Ambulate - Progress: Goal set today  Visit Information  Last PT Received On: 03/17/13 Assistance Needed: +1    Subjective Data  Subjective: pt up in chair Patient Stated Goal: home   Prior Functioning  Home Living Lives With: Spouse Available Help at Discharge: Family Type of Home: Apartment Home Access: Level entry Home Layout: One level Home Adaptive Equipment: Environmental consultant - four wheeled Prior Function Level of Independence: Independent with assistive device(s) Vocation: On disability Communication Communication: No difficulties    Cognition  Cognition Arousal/Alertness: Awake/alert Behavior During Therapy: WFL for tasks assessed/performed Overall Cognitive Status: Within Functional Limits for tasks assessed    Extremity/Trunk Assessment Right Upper Extremity Assessment RUE ROM/Strength/Tone: [facility] for tasks assessed Left Upper Extremity Assessment LUE ROM/Strength/Tone: WFL for tasks assessed Right Lower Extremity Assessment RLE ROM/Strength/Tone: Within functional levels RLE Sensation: History of peripheral neuropathy Left Lower Extremity Assessment LLE ROM/Strength/Tone: Deficits LLE ROM/Strength/Tone Deficits: AROM grossly WFL; quads 3+/5, somewhat painful/sore per pt   Balance Static Standing Balance Static Standing -  Balance Support: Left upper extremity supported Static Standing - Level of  Assistance: 5: Stand by assistance  End of Session PT - End of Session Activity Tolerance: Treatment limited secondary to medical complications (Comment) (blood pressure) Patient left: in chair;with call bell/phone within reach;with family/visitor present;with nursing in room  GP     [facility] 03/17/2013, 12:44 PM

## 2013-03-17 NOTE — Procedures (Signed)
EEG NUMBER:  [id number].  REFERRING PHYSICIAN:  Dr. Blake Divine.  INDICATION FOR STUDY:  A 62 year old man with recurrent episodes of loss of consciousness as well as near loss of consciousness.  Study is being performed to rule out possible new onset seizure disorder.  DESCRIPTION:  This is a routine EEG recording performed during wakefulness.  Predominant background activity consisted of 8-9 Hz symmetrical alpha rhythm recorded from the posterior head regions. Photic stimulation produced a symmetrical occipital driving response. Hyperventilation was not performed.  No epileptiform discharges were recorded.  INTERPRETATION:  This EEG shows minimal generalized nonspecific slowing of cerebral activity, which can be seen with degenerative as well as metabolic encephalopathies.  No evidence of seizure disorder was demonstrated.     Noel Christmas, MD    YN:WGNF D:  03/16/2013 13:13:08  T:  03/17/2013 04:21:11  Job #:  [id number]

## 2013-03-18 ENCOUNTER — Encounter (HOSPITAL_COMMUNITY): Payer: Self-pay | Admitting: Cardiology

## 2013-03-18 ENCOUNTER — Inpatient Hospital Stay (HOSPITAL_COMMUNITY): Payer: Medicare HMO

## 2013-03-18 DIAGNOSIS — N19 Unspecified kidney failure: Secondary | ICD-10-CM

## 2013-03-18 LAB — CBC
Platelets: 310 10*3/uL (ref 150–400)
RBC: 3.65 MIL/uL — ABNORMAL LOW (ref 4.22–5.81)
RDW: 13.6 % (ref 11.5–15.5)
WBC: 5.9 10*3/uL (ref 4.0–10.5)

## 2013-03-18 LAB — URINALYSIS, ROUTINE W REFLEX MICROSCOPIC
Bilirubin Urine: NEGATIVE
Glucose, UA: NEGATIVE mg/dL
Hgb urine dipstick: NEGATIVE
Ketones, ur: NEGATIVE mg/dL
Protein, ur: NEGATIVE mg/dL

## 2013-03-18 LAB — GLUCOSE, CAPILLARY
Glucose-Capillary: 104 mg/dL — ABNORMAL HIGH (ref 70–99)
Glucose-Capillary: 103 mg/dL — ABNORMAL HIGH (ref 70–99)
Glucose-Capillary: 96 mg/dL (ref 70–99)

## 2013-03-18 LAB — PROTIME-INR: Prothrombin Time: 13.3 seconds (ref 11.6–15.2)

## 2013-03-18 MED ORDER — TECHNETIUM TC 99M DIETHYLENETRIAME-PENTAACETIC ACID: 41.6000 | Freq: Once | INTRAVENOUS | Status: AC | PRN

## 2013-03-18 MED ORDER — PYRIDOSTIGMINE BROMIDE 60 MG PO TABS
30.0000 mg | ORAL_TABLET | Freq: Three times a day (TID) | ORAL | Status: DC
  Administered 2013-03-18 – 2013-03-21 (×12): 30 mg via ORAL
  Filled 2013-03-18 (×13): qty 0.5

## 2013-03-18 MED ORDER — TECHNETIUM TO 99M ALBUMIN AGGREGATED
5.1000 | Freq: Once | INTRAVENOUS | Status: AC | PRN
  Administered 2013-03-18: 5 via INTRAVENOUS

## 2013-03-18 NOTE — Progress Notes (Addendum)
Subjective:  Is feeling relatively well but still gets a little dizzy when he stands up. Orthostatic blood pressure show a standing blood pressure 109 systolic this morning. No chest discomfort. A d-dimer ordered yesterday evening was positive and he is due to have a ventilation perfusion lung scan.  Objective:  Vital Signs in the last 24 hours: BP 109/74  Pulse 73  Temp(Src) 98.1 F (36.7 C) (Oral)  Resp 18  Ht 6\' 1"  (1.854 m)  Wt 114 kg (251 lb 5.2 oz)  BMI 33.17 kg/m2  SpO2 98%  Physical Exam: Pleasant white male in no acute distress Lungs:  Clear Cardiac:  Regular rhythm, normal S1 and S2, no S3 Abdomen:  Soft, nontender, no masses Extremities: Trace edema  Intake/Output from previous day: 05/01 0701 - 05/02 0700 In: 240 [P.O.:240] Out: 1500 [Urine:1500] Weight Filed Weights   03/15/13 0342  Weight: 114 kg (251 lb 5.2 oz)    Lab Results: Basic Metabolic Panel:  Recent Labs  16/10/96 0515  NA 136  K 4.4  CL 103  CO2 21  GLUCOSE 166*  BUN 36*  CREATININE 1.72*    CBC:  Recent Labs  03/16/13 0515 03/18/13 0449  WBC 4.8 5.9  HGB 9.4* 9.3*  HCT 30.2* 30.4*  MCV 82.1 83.3  PLT 286 310    BNP    Component Value Date/Time   PROBNP 761.9* 03/06/2013 0100    PROTIME: Lab Results  Component Value Date   INR 1.02 03/18/2013    Telemetry: Sinus rhythm  Assessment/Plan:  1. Significant orthostatic hypotension-standing blood pressure is only 109 this morning, with significant supine hypertension. This is a difficult problem to treat. He will need control of his supine hypertension also. We'll initiate the use of Mestinon which oftentimes will attenuate the standing blood pressure dropped so that you can treat his supine hypertension.  2. Stage 3-4 chronic kidney disease 3. Hypertensive heart disease 4. Diabetes mellitus with multiple complications 5. Abnormal d-dimer  Recommendations:  Initiate Mestinon. Monitor blood pressure and may need to  titrate additional hypertensive medicines depending on what his orthostatic blood pressure drop is. Await ventilation perfusion scan as well as lower extremity venous Dopplers.  The other thing that might be helpful would be erythropoietin. This also may be helpful with orthostasis. I would ask for a nephrology consult to assist with this.  He also may need to be fitted with waist high support hose if the above measures are not effective.  He should sleep with the [facility] elevated 6 inches.  Darden Palmer  MD [facility]  03/18/2013, 8:30 AM

## 2013-03-18 NOTE — Progress Notes (Signed)
[facility] PROGRESS NOTE  [name] AVW:[id number] DOB: Jan 25, 1951 DOA: 03/14/2013 PCP: Dr Rene Paci  Assessment/Plan: Syncope . Orthostatic syncope: probably from orthostatic hypotension, but i do not see any documentation of orthostatic blood pressures on admission. He was given iv steroids, fluids, compression stockings. He had a recent stress test did not show any reversible ischemia. His LVEF is 53.%. On admission he had an EKG done which ventricular bigeminy. Repeat EKG last night showed NSR with LVH hypertrophy. His orthostatic blood pressures were positive yesterday evening and I had a lengthy discussion with the family that these recurrent syncope could be secondary to autonomic dysfunction from long standing Diabetes Mellitus. Family requested to see Dr Donnie Aho his cardiologist from prior hospitalization.  willr equest Dr Donnie Aho to talk to the family.  Of Note EEG was done to evaluate for seizures, showed generalized slowing and no seizure activity.  . Diabetetic peripheral neuropathy; resume home medications.  . Anemia of chronic disease;s table . 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 We have stopped reglan and will watch him for 24 hours for recurrent tremors.  . Obesity (BMI 30-39.9) DVT prophylaxis.  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   Diabetes mellitus:  CBG (last 3)   Recent Labs  03/18/13 0726 03/18/13 1112 03/18/13 1633  GLUCAP 104* 99 103*     Resume home medications and SSI .  Elevated d dimer: negative dopplers and NM scan show low probability for PE.     .   Code Status: full code Family Communication: none at bedside Disposition Plan: pending    Consultants:  None. HPI/Subjective: Comfortable. Requesting to go home possibly tomorrow.  Objective: Filed Vitals:   03/18/13 0649 03/18/13 0650 03/18/13 1519 03/18/13 1520  BP: 161/88 109/74 135/76 108/61  Pulse: 71 73 78 78  Temp: 98.1 F (36.7 C)  98.1 F (36.7 C) 98.4 F (36.9 C)   TempSrc: Oral Oral Oral   Resp: 18  20   Height:      Weight:      SpO2: 100% 98% 100% 98%    Intake/Output Summary (Last 24 hours) at 03/18/13 1636 Last data filed at 03/18/13 8295  Gross per 24 hour  Intake      0 ml  Output   1300 ml  Net  -1300 ml   Filed Weights   03/15/13 0342  Weight: 114 kg (251 lb 5.2 oz)    Exam: Alert afebrile comfortable.  CHEST: Normal respiration, clear to auscultation bilaterally.  HEART: Regular rate and rhythm. There are no murmur, rub, or gallops.  BACK:  no CVA tenderness  ABDOMEN: soft and non-tender; no masses, no organomegaly, normal abdominal bowel sounds; no pannus; no intertriginous candida. There is no rebound and no distention.  EXTREMITIES:trace edema.   Data Reviewed: Basic Metabolic Panel:  Recent Labs Lab 03/14/13 2142 03/15/13 0500 03/16/13 0515  NA 139 137 136  K 4.8 4.4 4.4  CL 112 104 103  CO2  --  19 21  GLUCOSE 110* 182* 166*  BUN 38* 35* 36*  CREATININE 2.40* 2.13*  2.14* 1.72*  CALCIUM  --  8.9 9.0   Liver Function Tests: No results found for this basename: AST, ALT, ALKPHOS, BILITOT, PROT, ALBUMIN,  in the last 168 hours No results found for this basename: LIPASE, AMYLASE,  in the last 168 hours No results found for this basename: AMMONIA,  in the last 168 hours CBC:  Recent Labs Lab 03/14/13 2142  03/15/13 0006 03/15/13 0500 03/16/13 0515 03/18/13 0449  WBC  --  4.1 4.5 4.8 5.9  NEUTROABS  --  2.0  --   --   --   HGB 9.9* 9.3* 9.6* 9.4* 9.3*  HCT 29.0* 29.5* 30.8* 30.2* 30.4*  MCV  --  83.1 83.7 82.1 83.3  PLT  --  285 294 286 310   Cardiac Enzymes:  Recent Labs Lab 03/15/13 0006  TROPONINI <0.30   BNP (last 3 results)  Recent Labs  12/01/12 0943 03/06/13 0100  PROBNP 4046.0* 761.9*   CBG:  Recent Labs Lab 03/17/13 1616 03/17/13 2117 03/18/13 0726 03/18/13 1112 03/18/13 1633  GLUCAP 174* 140* 104* 99 103*    No results found for  this or any previous visit (from the past 240 hour(s)).   Studies: Dg Chest 2 View  03/18/2013  *RADIOLOGY REPORT*  Clinical Data: Elevated D-dimer.  Some mid chest pain and shortness of breath  CHEST - 2 VIEW  Comparison: 03/05/2013  Findings: Heart size is mildly enlarged and stable.  The mediastinal contours are otherwise within normal limits with calcification in the aortic arch again noted.  The lung fields appear clear with no evidence for focal infiltrate or congestive failure.  No pleural fluid or significant peribronchial cuffing is seen.  No foci of focal oligemia are identified.  Bony structures appear intact.  IMPRESSION: Stable cardiopulmonary appearance with no new focal or acute abnormality seen   Original Report Authenticated By: Rhodia Albright, M.D.    Dg Elbow 2 Views Right  03/17/2013  *RADIOLOGY REPORT*  Clinical Data: Pain and swelling  RIGHT ELBOW - 2 VIEW  Comparison: None.  Findings: Frontal and lateral views were obtained.  There is a sizable spur arising from the olecranon.  There is soft tissue swelling over the posterior aspect of the elbow joint.  There is no abnormal calcification in this area.  There is no fracture, dislocation, or effusion.  There is no appreciable joint space narrowing.  IMPRESSION:  Soft tissue swelling posteriorly.  No calcification in this area.  Etiology for the swelling is uncertain.  There is a spur arising from the olecranon process of the ulna.  No fracture or effusion.   Original Report Authenticated By: Bretta Bang, M.D.    Nm Pulmonary Perf And Vent  03/18/2013  *RADIOLOGY REPORT*  Clinical Data: Elevated D-dimer study  NM PULMONARY VENTILATION AND PERFUSION SCAN  Views:  Anterior, posterior, right lateral, left lateral, RPO, LPO, RAO, LAO - ventilation and perfusion  Radiopharmaceutical: Technetium 4m DTPA - ventilation;Technetium 2m macroaggregated albumin - perfusion  Dose:  41.6 mCi - ventilation; 5.1 mCi - perfusion  Route of  administration:  Inhalation - ventilation; intravenous - perfusion  Findings:  The ventilation study shows homogeneous and symmetric uptake of radiotracer bilaterally.  The perfusion study shows homogeneous and symmetric uptake of radiotracer bilaterally.  There is no appreciable ventilation / perfusion mismatch.  IMPRESSION: Normal ventilation and perfusion lung scans.  Very low probability of pulmonary embolus.   Original Report Authenticated By: Bretta Bang, M.D.     Scheduled Meds: . aspirin EC  81 mg Oral q morning - 10a  . cycloSPORINE modified  100 mg Oral BID  . cycloSPORINE modified  50 mg Oral BID  . enoxaparin (LOVENOX) injection  115 mg Subcutaneous Q12H  . fluticasone  1 spray Each Nare Daily  . gabapentin  300 mg Oral BID  . insulin aspart  0-9 Units Subcutaneous TID WC  .  insulin detemir  12 Units Subcutaneous QHS  . labetalol  150 mg Oral BID  . Liraglutide  1.2 mg Subcutaneous Daily  . mycophenolate  500 mg Oral BID  . pantoprazole  40 mg Oral Daily  . pravastatin  20 mg Oral QPC supper  . pyridostigmine  30 mg Oral TID PC  . sodium chloride  3 mL Intravenous Q12H  . sulfamethoxazole-trimethoprim  1 tablet Oral Q M,W,F   Continuous Infusions:   Principal Problem:   Syncope Active Problems:   Diabetetic peripheral neuropathy   History of renal transplant   Orthostatic syncope   Anemia of chronic disease   Diabetes mellitus with renal manifestations, controlled   Obesity (BMI 30-39.9)   Action tremor        Rylei Codispoti  [facility] Pager [phone]. If 7PM-7AM, please contact night-coverage at [email or web address], password [email or web address] 03/18/2013, 4:36 PM  LOS: 4 days

## 2013-03-18 NOTE — Progress Notes (Signed)
Patient placed himself on his home cpap.

## 2013-03-18 NOTE — Progress Notes (Signed)
*  PRELIMINARY RESULTS* Vascular Ultrasound Lower extremity venous duplex has been completed.  Preliminary findings: Bilaterally no evidence of DVT.  Farrel Demark, RDMS, RVT  03/18/2013, 10:03 AM

## 2013-03-19 LAB — GLUCOSE, CAPILLARY
Glucose-Capillary: 99 mg/dL (ref 70–99)
Glucose-Capillary: 89 mg/dL (ref 70–99)

## 2013-03-19 NOTE — Progress Notes (Signed)
Pt stated that he does not need any help with his CPAP, pt has home machine, RT to monitor and assess as needed.

## 2013-03-19 NOTE — Plan of Care (Signed)
Problem: Phase II Progression Outcomes Goal: Vital signs remain stable Outcome: Progressing Still having drop in BP on standing  Goal: Obtain order to discontinue catheter if appropriate Outcome: Not Applicable Date Met:  03/19/13 No foley

## 2013-03-19 NOTE — Progress Notes (Signed)
SUBJECTIVE:  No complaints OBJECTIVE:   Vitals:   Filed Vitals:   03/18/13 2200 03/18/13 2201 03/18/13 2202 03/19/13 0623  BP: 144/70 125/70 118/65 144/75  Pulse: 71 73 73 83  Temp: 98 F (36.7 C)   98.5 F (36.9 C)  TempSrc: Oral   Oral  Resp: 18   18  Height:      Weight:      SpO2: 96%   100%   I&O's:   Intake/Output Summary (Last 24 hours) at 03/19/13 0824 Last data filed at 03/19/13 0750  Gross per 24 hour  Intake      0 ml  Output    925 ml  Net   -925 ml   TELEMETRY: Reviewed telemetry pt in NSR:     PHYSICAL EXAM General: Well developed, well nourished, in no acute distress Head: Eyes PERRLA, No xanthomas.   Normal cephalic and atramatic  Lungs:   Clear bilaterally to auscultation and percussion. Heart:   HRRR S1 S2 Pulses are 2+ & equal. Abdomen: Bowel sounds are positive, abdomen soft and non-tender without masses  Extremities:   No clubbing, cyanosis or edema.  DP +1 Neuro: Alert and oriented X 3. Psych:  Good affect, responds appropriately   LABS: Basic Metabolic Panel: No results found for this basename: NA, K, CL, CO2, GLUCOSE, BUN, CREATININE, CALCIUM, MG, PHOS,  in the last 72 hours Liver Function Tests: No results found for this basename: AST, ALT, ALKPHOS, BILITOT, PROT, ALBUMIN,  in the last 72 hours No results found for this basename: LIPASE, AMYLASE,  in the last 72 hours CBC:  Recent Labs  03/18/13 0449  WBC 5.9  HGB 9.3*  HCT 30.4*  MCV 83.3  PLT 310   Cardiac Enzymes: No results found for this basename: CKTOTAL, CKMB, CKMBINDEX, TROPONINI,  in the last 72 hours BNP: No components found with this basename: POCBNP,  D-Dimer:  Recent Labs  03/17/13 1912  DDIMER 2.57*   Hemoglobin A1C: No results found for this basename: HGBA1C,  in the last 72 hours Fasting Lipid Panel: No results found for this basename: CHOL, HDL, LDLCALC, TRIG, CHOLHDL, LDLDIRECT,  in the last 72 hours Thyroid Function Tests: No results found for this  basename: TSH, T4TOTAL, FREET3, T3FREE, THYROIDAB,  in the last 72 hours Anemia Panel: No results found for this basename: VITAMINB12, FOLATE, FERRITIN, TIBC, IRON, RETICCTPCT,  in the last 72 hours Coag Panel:   Lab Results  Component Value Date   INR 1.02 03/18/2013    RADIOLOGY: Dg Chest 2 View  03/18/2013  *RADIOLOGY REPORT*  Clinical Data: Elevated D-dimer.  Some mid chest pain and shortness of breath  CHEST - 2 VIEW  Comparison: 03/05/2013  Findings: Heart size is mildly enlarged and stable.  The mediastinal contours are otherwise within normal limits with calcification in the aortic arch again noted.  The lung fields appear clear with no evidence for focal infiltrate or congestive failure.  No pleural fluid or significant peribronchial cuffing is seen.  No foci of focal oligemia are identified.  Bony structures appear intact.  IMPRESSION: Stable cardiopulmonary appearance with no new focal or acute abnormality seen   Original Report Authenticated By: Rhodia Albright, M.D.    Dg Chest 2 View  03/05/2013  *RADIOLOGY REPORT*  Clinical Data: 62 year old male syncope weakness diabetes.  CHEST - 2 VIEW  Comparison: 02/03/2013 and earlier.  Findings: Semi upright AP and lateral views of the chest. Stable cardiomegaly and mediastinal contours.  Stable to  mildly improved lung volumes.  The lungs are clear.  No pneumothorax or effusion. No acute osseous abnormality identified.  IMPRESSION: Stable cardiomegaly.  No acute cardiopulmonary abnormality.   Original Report Authenticated By: Erskine Speed, M.D.    Dg Elbow 2 Views Right  03/17/2013  *RADIOLOGY REPORT*  Clinical Data: Pain and swelling  RIGHT ELBOW - 2 VIEW  Comparison: None.  Findings: Frontal and lateral views were obtained.  There is a sizable spur arising from the olecranon.  There is soft tissue swelling over the posterior aspect of the elbow joint.  There is no abnormal calcification in this area.  There is no fracture, dislocation, or effusion.   There is no appreciable joint space narrowing.  IMPRESSION:  Soft tissue swelling posteriorly.  No calcification in this area.  Etiology for the swelling is uncertain.  There is a spur arising from the olecranon process of the ulna.  No fracture or effusion.   Original Report Authenticated By: Bretta Bang, M.D.    Ct Head Wo Contrast  03/05/2013  *RADIOLOGY REPORT*  Clinical Data: Syncope, weakness  CT HEAD WITHOUT CONTRAST  Technique:  Contiguous axial images were obtained from the base of the skull through the vertex without contrast.  Comparison: 01/10/2013  Findings: No evidence of parenchymal hemorrhage or extra-axial fluid collection. No mass lesion, mass effect, or midline shift.  No CT evidence of acute infarction.  Subcortical white matter and periventricular small vessel ischemic changes.  Intracranial atherosclerosis.  Stable mild frontal lobe atrophy.  Near complete opacification of the left frontal sinus.  Partial opacification of the left ethmoid sinus.  Mild partial opacification of the left mastoid air cells (series 3/image 3).  No evidence of calvarial fracture.  IMPRESSION: No evidence of acute intracranial abnormality.  Atrophy with small vessel ischemic changes and intracranial atherosclerosis.  Paranasal sinus disease, as described above.   Original Report Authenticated By: Charline Bills, M.D.    Mr Brain Wo Contrast  03/06/2013  *RADIOLOGY REPORT*  Clinical Data: 62 year old male with altered mental status, weakness, tremor. History of chronic renal disease.  MRI HEAD WITHOUT CONTRAST  Technique:  Multiplanar, multiecho pulse sequences of the brain and surrounding structures were obtained according to standard protocol without intravenous contrast.  Comparison: Head CT 03/05/2013 and earlier.  Brain MRI 02/21/2007.  Findings: Cerebral volume is not significantly changed since 2008. No restricted diffusion to suggest acute infarction.  No midline shift, mass effect, evidence of  mass lesion, ventriculomegaly, extra-axial collection or acute intracranial hemorrhage. Cervicomedullary junction and pituitary are within normal limits. Major intracranial vascular flow voids are stable.  Scattered mostly subcortical cerebral white matter T2 and FLAIR hyperintensity has mildly progressed since 2008.  Increased FLAIR signal in the intracranial vessels is noted, and is chronic to a degree.  No definite abnormal FLAIR signal in the subarachnoid spaces.  Subarachnoid spaces appear normal on all other sequences. No cortical encephalomalacia.  Stable and normal for age signal in the deep gray matter, brainstem, and cerebellum.  Stable and negative visualized cervical spine.  Normal bone marrow signal.  Small left mastoid effusion.  Negative visualized nasopharynx.  Right mastoids remain clear.  Other visualized internal auditory structures are grossly normal.  No opacification of the left frontal and ethmoid sinuses.  Stable mild maxillary sinus mucosal thickening. Visualized orbit soft tissues are within normal limits.  Negative scalp soft tissues.  IMPRESSION: 1. No acute intracranial abnormality. 2.  Mild progression of nonspecific cerebral white matter signal changes since 2008,  most commonly due to chronic small vessel disease.   Original Report Authenticated By: Erskine Speed, M.D.    Nm Myocar Multi W/spect W/wall Motion / Ef  03/08/2013  *RADIOLOGY REPORT*  Clinical Data:  62 year old male with history of diabetes and renal transplant. preoperative screening.  MYOCARDIAL IMAGING WITH SPECT (REST AND PHARMACOLOGIC-STRESS) GATED LEFT VENTRICULAR WALL MOTION STUDY LEFT VENTRICULAR EJECTION FRACTION  Technique:  Resting myocardial SPECT imaging was initially performed after intravenous administration of radiopharmaceutical. Myocardial SPECT was subsequently performed after additional radiopharmaceutical injection during pharmacologic-stress supervised by the Cardiology staff.  Quantitative gated  imaging was also performed to evaluate left ventricular wall motion, and estimate left ventricular ejection fraction.  Radiopharmaceutical:  Tc-101m Cardiolite at rest and during stress.  Comparison: none  Findings:  Technique: Study is adequate.  Perfusion:  There are no relative decreased counts on stress or rest to suggest reversible ischemia. There is a fixed defect within the apical segment and inferoseptal wall.  There is left ventricular dilatation on rest and stress.  Wall motion:  No focal wall motion abnormality.  Normal contractility.  Left ventricular ejection fraction:  Calculated left ventricular ejection fraction = 53%.  End-diastolic volume equals 157 ml End-systolic volume 73 ml  IMPRESSION:  1. No reversible ischemia.  Potential small to moderate scar within the apical segment of the inferior septal wall  2. Left ventricular dilatation is equal on rest and stress.  3.  No focal wall motion abnormality.  4.  Left ventricular ejection fraction equal53%%   Original Report Authenticated By: Genevive Bi, M.D.    Nm Pulmonary Perf And Vent  03/18/2013  *RADIOLOGY REPORT*  Clinical Data: Elevated D-dimer study  NM PULMONARY VENTILATION AND PERFUSION SCAN  Views:  Anterior, posterior, right lateral, left lateral, RPO, LPO, RAO, LAO - ventilation and perfusion  Radiopharmaceutical: Technetium 81m DTPA - ventilation;Technetium 47m macroaggregated albumin - perfusion  Dose:  41.6 mCi - ventilation; 5.1 mCi - perfusion  Route of administration:  Inhalation - ventilation; intravenous - perfusion  Findings:  The ventilation study shows homogeneous and symmetric uptake of radiotracer bilaterally.  The perfusion study shows homogeneous and symmetric uptake of radiotracer bilaterally.  There is no appreciable ventilation / perfusion mismatch.  IMPRESSION: Normal ventilation and perfusion lung scans.  Very low probability of pulmonary embolus.   Original Report Authenticated By: Bretta Bang,  M.D.    Assessment/Plan:  1. Significant orthostatic hypotension-Mestinon initiated yesterday so will check orthostatic BPs today.  He denies any dizziness with standing this am 2. Stage 3-4 chronic kidney disease  3. Hypertensive heart disease - BP controlled today 4. Diabetes mellitus with multiple complications  5. Abnormal d-dimer with normal VQ scan - low prob for PE      Quintella Reichert, MD  03/19/2013  8:24 AM

## 2013-03-19 NOTE — Progress Notes (Signed)
[facility] PROGRESS NOTE  [name] YQM:[id number] DOB: 07/17/1951 DOA: 03/14/2013 PCP: Dr Rene Paci  Assessment/Plan: Syncope . Orthostatic syncope: probably from orthostatic hypotension, but i do not see any documentation of orthostatic blood pressures on admission. He was given iv steroids, fluids, compression stockings. He had a recent stress test did not show any reversible ischemia. His LVEF is 53.%. On admission he had an EKG done which ventricular bigeminy. Repeat EKG last night showed NSR with LVH hypertrophy. His orthostatic blood pressures were positive yesterday evening and I had a lengthy discussion with the family that these recurrent syncope could be secondary to autonomic dysfunction from long standing Diabetes Mellitus. Family requested to see Dr Donnie Aho his cardiologist from prior hospitalization.  Of Note EEG was done to evaluate for seizures, showed generalized slowing and no seizure activity. Pt was started on mestinon yesterday.  . Diabetetic peripheral neuropathy; resume home medications.  . Anemia of chronic disease;s table . 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 We have stopped reglan and will watch him for 24 hours for recurrent tremors.  . Obesity (BMI 30-39.9) DVT prophylaxis.  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   Diabetes mellitus:  CBG (last 3)   Recent Labs  03/19/13 0734 03/19/13 1122 03/19/13 1651  GLUCAP 99 102* 108*     Resume home medications and SSI .  Elevated d dimer: negative dopplers and NM scan show low probability for PE.     .   Code Status: full code Family Communication: none at bedside Disposition Plan: pending    Consultants:  None. HPI/Subjective: Comfortable. Requesting to go home possibly tomorrow.  Objective: Filed Vitals:   03/19/13 0913 03/19/13 0924 03/19/13 1403 03/19/13 1500  BP: 155/72 140/54 166/81 156/72  Pulse: 72 70 71   Temp:   98.2 F (36.8 C)    TempSrc:   Oral   Resp: 18 18 18    Height:      Weight:      SpO2: 100% 100% 100%     Intake/Output Summary (Last 24 hours) at 03/19/13 1828 Last data filed at 03/19/13 0750  Gross per 24 hour  Intake      0 ml  Output    625 ml  Net   -625 ml   Filed Weights   03/15/13 0342  Weight: 114 kg (251 lb 5.2 oz)    Exam: Alert afebrile comfortable.  CHEST: Normal respiration, clear to auscultation bilaterally.  HEART: Regular rate and rhythm. There are no murmur, rub, or gallops.  BACK:  no CVA tenderness  ABDOMEN: soft and non-tender; no masses, no organomegaly, normal abdominal bowel sounds; no pannus; no intertriginous candida. There is no rebound and no distention.  EXTREMITIES:trace edema.   Data Reviewed: Basic Metabolic Panel:  Recent Labs Lab 03/14/13 2142 03/15/13 0500 03/16/13 0515  NA 139 137 136  K 4.8 4.4 4.4  CL 112 104 103  CO2  --  19 21  GLUCOSE 110* 182* 166*  BUN 38* 35* 36*  CREATININE 2.40* 2.13*  2.14* 1.72*  CALCIUM  --  8.9 9.0   Liver Function Tests: No results found for this basename: AST, ALT, ALKPHOS, BILITOT, PROT, ALBUMIN,  in the last 168 hours No results found for this basename: LIPASE, AMYLASE,  in the last 168 hours No results found for this basename: AMMONIA,  in the last 168 hours CBC:  Recent Labs Lab 03/14/13 2142 03/15/13 0006 03/15/13 0500 03/16/13 0515 03/18/13 0449  WBC  --  4.1 4.5 4.8 5.9  NEUTROABS  --  2.0  --   --   --   HGB 9.9* 9.3* 9.6* 9.4* 9.3*  HCT 29.0* 29.5* 30.8* 30.2* 30.4*  MCV  --  83.1 83.7 82.1 83.3  PLT  --  285 294 286 310   Cardiac Enzymes:  Recent Labs Lab 03/15/13 0006  TROPONINI <0.30   BNP (last 3 results)  Recent Labs  12/01/12 0943 03/06/13 0100  PROBNP 4046.0* 761.9*   CBG:  Recent Labs Lab 03/18/13 1633 03/18/13 2131 03/19/13 0734 03/19/13 1122 03/19/13 1651  GLUCAP 103* 96 99 102* 108*    Recent Results (from the past 240 hour(s))  URINE CULTURE      Status: None   Collection Time    03/18/13  5:00 AM      Result Value Range Status   Specimen Description URINE, CLEAN CATCH   Final   Special Requests bactrim Immunocompromised   Final   Culture  Setup Time 03/18/2013 10:37   Final   Colony Count PENDING   Incomplete   Culture Culture reincubated for better growth   Final   Report Status PENDING   Incomplete     Studies: Dg Chest 2 View  03/18/2013  *RADIOLOGY REPORT*  Clinical Data: Elevated D-dimer.  Some mid chest pain and shortness of breath  CHEST - 2 VIEW  Comparison: 03/05/2013  Findings: Heart size is mildly enlarged and stable.  The mediastinal contours are otherwise within normal limits with calcification in the aortic arch again noted.  The lung fields appear clear with no evidence for focal infiltrate or congestive failure.  No pleural fluid or significant peribronchial cuffing is seen.  No foci of focal oligemia are identified.  Bony structures appear intact.  IMPRESSION: Stable cardiopulmonary appearance with no new focal or acute abnormality seen   Original Report Authenticated By: Rhodia Albright, M.D.    Nm Pulmonary Perf And Vent  03/18/2013  *RADIOLOGY REPORT*  Clinical Data: Elevated D-dimer study  NM PULMONARY VENTILATION AND PERFUSION SCAN  Views:  Anterior, posterior, right lateral, left lateral, RPO, LPO, RAO, LAO - ventilation and perfusion  Radiopharmaceutical: Technetium 16m DTPA - ventilation;Technetium 71m macroaggregated albumin - perfusion  Dose:  41.6 mCi - ventilation; 5.1 mCi - perfusion  Route of administration:  Inhalation - ventilation; intravenous - perfusion  Findings:  The ventilation study shows homogeneous and symmetric uptake of radiotracer bilaterally.  The perfusion study shows homogeneous and symmetric uptake of radiotracer bilaterally.  There is no appreciable ventilation / perfusion mismatch.  IMPRESSION: Normal ventilation and perfusion lung scans.  Very low probability of pulmonary embolus.   Original  Report Authenticated By: Bretta Bang, M.D.     Scheduled Meds: . aspirin EC  81 mg Oral q morning - 10a  . cycloSPORINE modified  100 mg Oral BID  . cycloSPORINE modified  50 mg Oral BID  . enoxaparin (LOVENOX) injection  115 mg Subcutaneous Q12H  . fluticasone  1 spray Each Nare Daily  . gabapentin  300 mg Oral BID  . insulin aspart  0-9 Units Subcutaneous TID WC  . insulin detemir  12 Units Subcutaneous QHS  . Liraglutide  1.2 mg Subcutaneous Daily  . mycophenolate  500 mg Oral BID  . pantoprazole  40 mg Oral Daily  . pravastatin  20 mg Oral QPC supper  . pyridostigmine  30 mg Oral TID PC  . sodium chloride  3 mL Intravenous Q12H  .  sulfamethoxazole-trimethoprim  1 tablet Oral Q M,W,F   Continuous Infusions:   Principal Problem:   Syncope Active Problems:   Diabetetic peripheral neuropathy   History of renal transplant   Orthostatic syncope   Anemia of chronic disease   Diabetes mellitus with renal manifestations, controlled   Obesity (BMI 30-39.9)   Action tremor        [name]  [facility] Pager [phone]. If 7PM-7AM, please contact night-coverage at [email or web address], password [facility] 03/19/2013, 6:28 PM  LOS: 5 days

## 2013-03-20 DIAGNOSIS — I119 Hypertensive heart disease without heart failure: Secondary | ICD-10-CM

## 2013-03-20 LAB — URINE CULTURE

## 2013-03-20 LAB — GLUCOSE, CAPILLARY: Glucose-Capillary: 96 mg/dL (ref 70–99)

## 2013-03-20 MED ORDER — AMLODIPINE BESYLATE 5 MG PO TABS
5.0000 mg | ORAL_TABLET | Freq: Every day | ORAL | Status: DC
  Administered 2013-03-20 – 2013-03-21 (×2): 5 mg via ORAL
  Filled 2013-03-20 (×2): qty 1

## 2013-03-20 MED ORDER — ENOXAPARIN SODIUM 60 MG/0.6ML ~~LOC~~ SOLN
50.0000 mg | SUBCUTANEOUS | Status: DC
  Administered 2013-03-21 – 2013-03-23 (×3): 50 mg via SUBCUTANEOUS
  Filled 2013-03-20 (×3): qty 0.6

## 2013-03-20 NOTE — Progress Notes (Addendum)
SUBJECTIVE:  No dizziness yesterday  OBJECTIVE:   Vitals:   Filed Vitals:   03/19/13 1403 03/19/13 1500 03/19/13 2105 03/20/13 0505  BP: 166/81 156/72 166/83 166/85  Pulse: 71  68 82  Temp: 98.2 F (36.8 C)  98.2 F (36.8 C) 98 F (36.7 C)  TempSrc: Oral  Oral Oral  Resp: 18  18 18   Height:      Weight:      SpO2: 100%  99% 99%   I&O's:   Intake/Output Summary (Last 24 hours) at 03/20/13 0648 Last data filed at 03/20/13 0500  Gross per 24 hour  Intake    120 ml  Output    800 ml  Net   -680 ml     PHYSICAL EXAM General: Well developed, well nourished, in no acute distress Head: Eyes PERRLA, No xanthomas.   Normal cephalic and atramatic  Lungs:   Clear bilaterally to auscultation and percussion. Heart:   HRRR S1 S2 Pulses are 2+ & equal. Abdomen: Bowel sounds are positive, abdomen soft and non-tender without masses  Extremities:   No clubbing, cyanosis or edema.  DP +1 Neuro: Alert and oriented X 3. Psych:  Good affect, responds appropriately   LABS: Basic Metabolic Panel: No results found for this basename: NA, K, CL, CO2, GLUCOSE, BUN, CREATININE, CALCIUM, MG, PHOS,  in the last 72 hours Liver Function Tests: No results found for this basename: AST, ALT, ALKPHOS, BILITOT, PROT, ALBUMIN,  in the last 72 hours No results found for this basename: LIPASE, AMYLASE,  in the last 72 hours CBC:  Recent Labs  03/18/13 0449  WBC 5.9  HGB 9.3*  HCT 30.4*  MCV 83.3  PLT 310   Cardiac Enzymes: No results found for this basename: CKTOTAL, CKMB, CKMBINDEX, TROPONINI,  in the last 72 hours BNP: No components found with this basename: POCBNP,  D-Dimer:  Recent Labs  03/17/13 1912  DDIMER 2.57*    Lab Results  Component Value Date   INR 1.02 03/18/2013    RADIOLOGY: Dg Chest 2 View  03/18/2013  *RADIOLOGY REPORT*  Clinical Data: Elevated D-dimer.  Some mid chest pain and shortness of breath  CHEST - 2 VIEW  Comparison: 03/05/2013  Findings: Heart size is mildly  enlarged and stable.  The mediastinal contours are otherwise within normal limits with calcification in the aortic arch again noted.  The lung fields appear clear with no evidence for focal infiltrate or congestive failure.  No pleural fluid or significant peribronchial cuffing is seen.  No foci of focal oligemia are identified.  Bony structures appear intact.  IMPRESSION: Stable cardiopulmonary appearance with no new focal or acute abnormality seen   Original Report Authenticated By: Rhodia Albright, M.D.    Dg Chest 2 View  03/05/2013  *RADIOLOGY REPORT*  Clinical Data: 62 year old male syncope weakness diabetes.  CHEST - 2 VIEW  Comparison: 02/03/2013 and earlier.  Findings: Semi upright AP and lateral views of the chest. Stable cardiomegaly and mediastinal contours.  Stable to mildly improved lung volumes.  The lungs are clear.  No pneumothorax or effusion. No acute osseous abnormality identified.  IMPRESSION: Stable cardiomegaly.  No acute cardiopulmonary abnormality.   Original Report Authenticated By: Erskine Speed, M.D.    Dg Elbow 2 Views Right  03/17/2013  *RADIOLOGY REPORT*  Clinical Data: Pain and swelling  RIGHT ELBOW - 2 VIEW  Comparison: None.  Findings: Frontal and lateral views were obtained.  There is a sizable spur arising from the olecranon.  There is soft tissue swelling over the posterior aspect of the elbow joint.  There is no abnormal calcification in this area.  There is no fracture, dislocation, or effusion.  There is no appreciable joint space narrowing.  IMPRESSION:  Soft tissue swelling posteriorly.  No calcification in this area.  Etiology for the swelling is uncertain.  There is a spur arising from the olecranon process of the ulna.  No fracture or effusion.   Original Report Authenticated By: Bretta Bang, M.D.    Ct Head Wo Contrast  03/05/2013  *RADIOLOGY REPORT*  Clinical Data: Syncope, weakness  CT HEAD WITHOUT CONTRAST  Technique:  Contiguous axial images were obtained  from the base of the skull through the vertex without contrast.  Comparison: 01/10/2013  Findings: No evidence of parenchymal hemorrhage or extra-axial fluid collection. No mass lesion, mass effect, or midline shift.  No CT evidence of acute infarction.  Subcortical white matter and periventricular small vessel ischemic changes.  Intracranial atherosclerosis.  Stable mild frontal lobe atrophy.  Near complete opacification of the left frontal sinus.  Partial opacification of the left ethmoid sinus.  Mild partial opacification of the left mastoid air cells (series 3/image 3).  No evidence of calvarial fracture.  IMPRESSION: No evidence of acute intracranial abnormality.  Atrophy with small vessel ischemic changes and intracranial atherosclerosis.  Paranasal sinus disease, as described above.   Original Report Authenticated By: Charline Bills, M.D.    Mr Brain Wo Contrast  03/06/2013  *RADIOLOGY REPORT*  Clinical Data: 62 year old male with altered mental status, weakness, tremor. History of chronic renal disease.  MRI HEAD WITHOUT CONTRAST  Technique:  Multiplanar, multiecho pulse sequences of the brain and surrounding structures were obtained according to standard protocol without intravenous contrast.  Comparison: Head CT 03/05/2013 and earlier.  Brain MRI 02/21/2007.  Findings: Cerebral volume is not significantly changed since 2008. No restricted diffusion to suggest acute infarction.  No midline shift, mass effect, evidence of mass lesion, ventriculomegaly, extra-axial collection or acute intracranial hemorrhage. Cervicomedullary junction and pituitary are within normal limits. Major intracranial vascular flow voids are stable.  Scattered mostly subcortical cerebral white matter T2 and FLAIR hyperintensity has mildly progressed since 2008.  Increased FLAIR signal in the intracranial vessels is noted, and is chronic to a degree.  No definite abnormal FLAIR signal in the subarachnoid spaces.  Subarachnoid  spaces appear normal on all other sequences. No cortical encephalomalacia.  Stable and normal for age signal in the deep gray matter, brainstem, and cerebellum.  Stable and negative visualized cervical spine.  Normal bone marrow signal.  Small left mastoid effusion.  Negative visualized nasopharynx.  Right mastoids remain clear.  Other visualized internal auditory structures are grossly normal.  No opacification of the left frontal and ethmoid sinuses.  Stable mild maxillary sinus mucosal thickening. Visualized orbit soft tissues are within normal limits.  Negative scalp soft tissues.  IMPRESSION: 1. No acute intracranial abnormality. 2.  Mild progression of nonspecific cerebral white matter signal changes since 2008, most commonly due to chronic small vessel disease.   Original Report Authenticated By: Erskine Speed, M.D.    Nm Myocar Multi W/spect W/wall Motion / Ef  03/08/2013  *RADIOLOGY REPORT*  Clinical Data:  62 year old male with history of diabetes and renal transplant. preoperative screening.  MYOCARDIAL IMAGING WITH SPECT (REST AND PHARMACOLOGIC-STRESS) GATED LEFT VENTRICULAR WALL MOTION STUDY LEFT VENTRICULAR EJECTION FRACTION  Technique:  Resting myocardial SPECT imaging was initially performed after intravenous administration of radiopharmaceutical. Myocardial SPECT was  subsequently performed after additional radiopharmaceutical injection during pharmacologic-stress supervised by the Cardiology staff.  Quantitative gated imaging was also performed to evaluate left ventricular wall motion, and estimate left ventricular ejection fraction.  Radiopharmaceutical:  Tc-30m Cardiolite at rest and during stress.  Comparison: none  Findings:  Technique: Study is adequate.  Perfusion:  There are no relative decreased counts on stress or rest to suggest reversible ischemia. There is a fixed defect within the apical segment and inferoseptal wall.  There is left ventricular dilatation on rest and  stress.  Wall motion:  No focal wall motion abnormality.  Normal contractility.  Left ventricular ejection fraction:  Calculated left ventricular ejection fraction = 53%.  End-diastolic volume equals 157 ml End-systolic volume 73 ml  IMPRESSION:  1. No reversible ischemia.  Potential small to moderate scar within the apical segment of the inferior septal wall  2. Left ventricular dilatation is equal on rest and stress.  3.  No focal wall motion abnormality.  4.  Left ventricular ejection fraction equal53%%   Original Report Authenticated By: Genevive Bi, M.D.    Nm Pulmonary Perf And Vent  03/18/2013  *RADIOLOGY REPORT*  Clinical Data: Elevated D-dimer study  NM PULMONARY VENTILATION AND PERFUSION SCAN  Views:  Anterior, posterior, right lateral, left lateral, RPO, LPO, RAO, LAO - ventilation and perfusion  Radiopharmaceutical: Technetium 38m DTPA - ventilation;Technetium 84m macroaggregated albumin - perfusion  Dose:  41.6 mCi - ventilation; 5.1 mCi - perfusion  Route of administration:  Inhalation - ventilation; intravenous - perfusion  Findings:  The ventilation study shows homogeneous and symmetric uptake of radiotracer bilaterally.  The perfusion study shows homogeneous and symmetric uptake of radiotracer bilaterally.  There is no appreciable ventilation / perfusion mismatch.  IMPRESSION: Normal ventilation and perfusion lung scans.  Very low probability of pulmonary embolus.   Original Report Authenticated By: Bretta Bang, M.D.     Assessment/Plan:  1. Significant orthostatic hypotension-Mestinon initiated 2 days ago and orthostatic BPs last PM still positive but improved with standing SBP of .  Recommend patient be fitted for waist high TED hose compression stockings- the ones he has on now do not appear to fit well and are falling down past his knees 2. Stage 3-4 chronic kidney disease  3. Hypertensive heart disease - BP mildly elevated yesterday during the day- May need to start  antihypertensive agent- will leave decision to Hospitalist - would avoid beta blocker which would attenuate HR response to orthostasis.  Best option may be amlodipine.  Unfortunately cannot use ACEI due to renal insuff 4. Diabetes mellitus with multiple complications  5. Abnormal d-dimer with normal VQ scan - low prob for PE        Quintella Reichert, MD  03/20/2013  6:48 AM

## 2013-03-20 NOTE — Progress Notes (Addendum)
[facility] PROGRESS NOTE  [name] AVW:[id number] DOB: 03-05-1951 DOA: 03/14/2013 PCP: Dr Rene Paci  Assessment/Plan:  . Orthostatic syncope: probably from orthostatic hypotension, but i do not see any documentation of orthostatic blood pressures on admission. He was given iv steroids, fluids, compression stockings. He had a recent stress test did not show any reversible ischemia. His LVEF is 53.%. On admission he had an EKG done which ventricular bigeminy. Repeat EKG last night showed NSR with LVH hypertrophy. His orthostatic blood pressures were positive yesterday evening and I had a lengthy discussion with the family that these recurrent syncope could be secondary to autonomic dysfunction from long standing Diabetes Mellitus. Family requested to see Dr Donnie Aho his cardiologist from prior hospitalization.  Of Note EEG was done to evaluate for seizures, showed generalized slowing and no seizure activity. Pt was started on mestinon on 5/1. His orthostatics were negative yesterday and this am.  We have stopped the labetolol and started him on low dose norvasc.   . Diabetetic peripheral neuropathy; resume home medications.  . Anemia of chronic disease;s table . 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 We have stopped reglan and will watch him for 24 hours for recurrent tremors.  . 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   Diabetes mellitus:  CBG (last 3)   Recent Labs  03/19/13 2154 03/20/13 0724 03/20/13 1110  GLUCAP 89 90 93     Resume home medications and SSI .  Elevated d dimer: negative dopplers and NM scan show low probability for PE. Stop therapeutic dose of lovenox and start him on prophylactic dose.   Renal transplant: on chronic immunosuppression.   Obstructive sleep apnea: on CPAP .  DVT prophylaxis.   Code Status: full code Family Communication: none at bedside Disposition Plan: pending     Consultants:  None. HPI/Subjective: Comfortable. Requesting to go home possibly tomorrow.  Objective: Filed Vitals:   03/20/13 1230 03/20/13 1243 03/20/13 1245 03/20/13 1436  BP: 158/83 160/70 145/77 135/87  Pulse: 73 72 85 72  Temp:    98.2 F (36.8 C)  TempSrc:    Oral  Resp: 18 18 18 18   Height:      Weight:      SpO2: 100% 100% 100% 99%    Intake/Output Summary (Last 24 hours) at 03/20/13 1707 Last data filed at 03/20/13 1000  Gross per 24 hour  Intake    130 ml  Output    500 ml  Net   -370 ml   Filed Weights   03/15/13 0342  Weight: 114 kg (251 lb 5.2 oz)    Exam: Alert afebrile comfortable.  CHEST: Normal respiration, clear to auscultation bilaterally.  HEART: Regular rate and rhythm. There are no murmur, rub, or gallops.  BACK:  no CVA tenderness  ABDOMEN: soft and non-tender; no masses, no organomegaly, normal abdominal bowel sounds; no pannus; no intertriginous candida. There is no rebound and no distention.  EXTREMITIES:trace edema.   Data Reviewed: Basic Metabolic Panel:  Recent Labs Lab 03/14/13 2142 03/15/13 0500 03/16/13 0515  NA 139 137 136  K 4.8 4.4 4.4  CL 112 104 103  CO2  --  19 21  GLUCOSE 110* 182* 166*  BUN 38* 35* 36*  CREATININE 2.40* 2.13*  2.14* 1.72*  CALCIUM  --  8.9 9.0   Liver Function Tests: No results found for this basename: AST, ALT, ALKPHOS, BILITOT, PROT, ALBUMIN,  in the last 168  hours No results found for this basename: LIPASE, AMYLASE,  in the last 168 hours No results found for this basename: AMMONIA,  in the last 168 hours CBC:  Recent Labs Lab 03/14/13 2142 03/15/13 0006 03/15/13 0500 03/16/13 0515 03/18/13 0449  WBC  --  4.1 4.5 4.8 5.9  NEUTROABS  --  2.0  --   --   --   HGB 9.9* 9.3* 9.6* 9.4* 9.3*  HCT 29.0* 29.5* 30.8* 30.2* 30.4*  MCV  --  83.1 83.7 82.1 83.3  PLT  --  285 294 286 310   Cardiac Enzymes:  Recent Labs Lab 03/15/13 0006  TROPONINI <0.30   BNP (last 3  results)  Recent Labs  12/01/12 0943 03/06/13 0100  PROBNP 4046.0* 761.9*   CBG:  Recent Labs Lab 03/19/13 1122 03/19/13 1651 03/19/13 2154 03/20/13 0724 03/20/13 1110  GLUCAP 102* 108* 89 90 93    Recent Results (from the past 240 hour(s))  URINE CULTURE     Status: None   Collection Time    03/18/13  5:00 AM      Result Value Range Status   Specimen Description URINE, CLEAN CATCH   Final   Special Requests bactrim Immunocompromised   Final   Culture  Setup Time 03/18/2013 10:37   Final   Colony Count 20,OOO COLONIES/ML   Final   Culture     Final   Value: Multiple bacterial morphotypes present, none predominant. Suggest appropriate recollection if clinically indicated.   Report Status 03/20/2013 FINAL   Final     Studies: No results found.  Scheduled Meds: . amLODipine  5 mg Oral Daily  . aspirin EC  81 mg Oral q morning - 10a  . cycloSPORINE modified  100 mg Oral BID  . cycloSPORINE modified  50 mg Oral BID  . enoxaparin (LOVENOX) injection  115 mg Subcutaneous Q12H  . fluticasone  1 spray Each Nare Daily  . gabapentin  300 mg Oral BID  . insulin aspart  0-9 Units Subcutaneous TID WC  . insulin detemir  12 Units Subcutaneous QHS  . Liraglutide  1.2 mg Subcutaneous Daily  . mycophenolate  500 mg Oral BID  . pantoprazole  40 mg Oral Daily  . pravastatin  20 mg Oral QPC supper  . pyridostigmine  30 mg Oral TID PC  . sodium chloride  3 mL Intravenous Q12H  . sulfamethoxazole-trimethoprim  1 tablet Oral Q M,W,F   Continuous Infusions:   Principal Problem:   Syncope Active Problems:   Diabetetic peripheral neuropathy   History of renal transplant   Orthostatic syncope   Anemia of chronic disease   Diabetes mellitus with renal manifestations, controlled   Obesity (BMI 30-39.9)   Action tremor        Rykin Route  [facility] Pager [phone]. If 7PM-7AM, please contact night-coverage at [email or web address], password [facility] 03/20/2013, 5:07 PM  LOS:  6 days

## 2013-03-20 NOTE — Progress Notes (Addendum)
ANTICOAGULATION CONSULT NOTE - Follow Up  Pharmacy Consult for Enoxaparin Indication: rule out PE  Patient Measurements: Height: 6\' 1"  (185.4 cm) Weight: 251 lb 5.2 oz (114 kg) IBW/kg (Calculated) : 79.9  Labs:  Recent Labs  03/18/13 0449  HGB 9.3*  HCT 30.4*  PLT 310  LABPROT 13.3  INR 1.02    Estimated Creatinine Clearance: 58.1 ml/min (by C-G formula based on Cr of 1.72).   Assessment: 62 YO M with recurrent syncope, D-dimer is elevated and concern for PE. History of CKD with renal transplant  5/2 VQ scan shows low probability of PE  No bleeding reported in chart notes  Goal of Therapy:  Anti-Xa level 0.6-1.2 units/ml 4hrs after LMWH dose given Monitor platelets by anticoagulation protocol: Yes   Plan:   Is Lovenox 1mg /kg q12h still needed with VQ scan saying low probability of PE?  Darrol Angel, PharmD Pager: [phone] 03/20/2013,10:41 AM   ADDENDUM: Order to change Lovenox to DVT prophylaxis dosing. Since pt has a BMI >3, will use 0.5mg /kg Lovenox dosing (rounded down).  Lovenox 50mg  SQ q24h - next dose due tomorrow.  Darrol Angel, PharmD Pager: [phone] 03/20/2013 5:19 PM

## 2013-03-20 NOTE — Progress Notes (Signed)
Patient on home unit and did not require any assistance from RT.

## 2013-03-21 DIAGNOSIS — Z940 Kidney transplant status: Secondary | ICD-10-CM

## 2013-03-21 LAB — GLUCOSE, CAPILLARY: Glucose-Capillary: 82 mg/dL (ref 70–99)

## 2013-03-21 LAB — CBC
MCH: 25.7 pg — ABNORMAL LOW (ref 26.0–34.0)
MCHC: 30.6 g/dL (ref 30.0–36.0)
MCV: 83.8 fL (ref 78.0–100.0)
Platelets: 308 10*3/uL (ref 150–400)
RDW: 14 % (ref 11.5–15.5)

## 2013-03-21 MED ORDER — PYRIDOSTIGMINE BROMIDE 60 MG PO TABS
60.0000 mg | ORAL_TABLET | Freq: Three times a day (TID) | ORAL | Status: DC
  Administered 2013-03-22 – 2013-03-23 (×5): 60 mg via ORAL
  Filled 2013-03-21 (×7): qty 1

## 2013-03-21 MED ORDER — CAPTOPRIL 25 MG PO TABS
25.0000 mg | ORAL_TABLET | Freq: Two times a day (BID) | ORAL | Status: DC
  Administered 2013-03-22 – 2013-03-23 (×2): 25 mg via ORAL
  Filled 2013-03-21 (×6): qty 1

## 2013-03-21 MED ORDER — CAPTOPRIL 25 MG PO TABS
25.0000 mg | ORAL_TABLET | Freq: Every day | ORAL | Status: DC
  Administered 2013-03-21 – 2013-03-22 (×2): 25 mg via ORAL
  Filled 2013-03-21 (×3): qty 1

## 2013-03-21 MED ORDER — CAPTOPRIL 25 MG PO TABS
25.0000 mg | ORAL_TABLET | Freq: Every evening | ORAL | Status: DC | PRN
  Filled 2013-03-21: qty 1

## 2013-03-21 NOTE — Progress Notes (Deleted)
PHARMACY BRIEF NOTE:  METHADONE  Contacted [employer] to confirm patient's usual methadone dosage.    Patient's usual dosage was confirmed as 70mg  daily.  He is reportedly in a program that permits dispensing of a one week supply, and he last received a supply on 03/16/13.  Elie Goody, PharmD, BCPS Pager: [phone] 03/21/2013  10:17 AM

## 2013-03-21 NOTE — Progress Notes (Signed)
Physical Therapy Treatment Patient Details Name: [name] MRN: [id number] DOB: 1951-07-28 Today's Date: 03/21/2013 Time: 8295-6213 PT Time Calculation (min): 24 min  PT Assessment / Plan / Recommendation Comments on Treatment Session  Pt very motivated but cautious due to BP's and L knee buckling.  "If we walkw are you going to be able to hold me up?"  Ensured pt we would use 2 assist and have recliner chair follow closely behind. Amb limited distance due to BP dropping.     Follow Up Recommendations  Home health PT     Does the patient have the potential to tolerate intense rehabilitation     Barriers to Discharge        Equipment Recommendations  None recommended by PT    Recommendations for Other Services    Frequency Min 3X/week   Plan      Precautions / Restrictions Precautions Precautions: Fall Precaution Comments: hypotension/near syncope/ compression stockings...Marland KitchenMarland KitchenMarland Kitchenpt stated he has fallen about 6 times in the past 6 months  Restrictions Weight Bearing Restrictions: No   Pertinent Vitals/Pain No c/o pain C/o L knee weakness    Mobility  Bed Mobility Bed Mobility: Not assessed Details for Bed Mobility Assistance: Pt OOB in recliner Transfers Transfers: Sit to Stand;Stand to Sit Sit to Stand: 4: Min guard;With upper extremity assist;From chair/3-in-1 Stand to Sit: 4: Min guard;With upper extremity assist;To chair/3-in-1 Details for Transfer Assistance: used momentum for sit to stand, UEs to control descent Ambulation/Gait Ambulation/Gait Assistance: 1: +2 Total assist Ambulation/Gait: Patient Percentage: 80% Ambulation Distance (Feet): 18 Feet Ambulation/Gait Assistance Details: + 2 assist for safety as pt's BP did drop from 152/80 sitting to 104/66 standing plus pt states he has a bad L knee that gives out. Recliner following closely behind. Gait Pattern: Step-to pattern;Decreased step length - right;Decreased step length - left;Shuffle;Decreased stance  time - left;Wide base of support Gait velocity: slow General Gait Details: L knee buckling noted with increased distance      PT Goals                                           progressing    Visit Information  Last PT Received On: 03/21/13 Assistance Needed: +1    Subjective Data  Subjective: My left knee gives out.  Are you going to be able to hold me up? Patient Stated Goal: Have Dr Magnus Ivan to do TKR   Cognition    good   Balance   fair+  End of Session PT - End of Session Equipment Utilized During Treatment: Gait belt Activity Tolerance: Treatment limited secondary to medical complications (Comment) Patient left: in chair;with call bell/phone within reach   Felecia Shelling  PTA [facility]  Acute  Rehab Pager      [phone]

## 2013-03-21 NOTE — Progress Notes (Signed)
[facility] PROGRESS NOTE  [name] WUJ:[id number] DOB: 12-08-1950 DOA: 03/14/2013 PCP: Dr Rene Paci  Assessment/Plan:  . Orthostatic syncope: probably from orthostatic hypotension, but i do not see any documentation of orthostatic blood pressures on admission. He was given iv steroids, fluids, compression stockings. He had a recent stress test did not show any reversible ischemia. His LVEF is 53.%. On admission he had an EKG done which ventricular bigeminy. Repeat EKG last night showed NSR with LVH hypertrophy. His orthostatic blood pressures were positive yesterday evening and I had a lengthy discussion with the family that these recurrent syncope could be secondary to autonomic dysfunction from long standing Diabetes Mellitus. Family requested to see Dr [name] his cardiologist from prior hospitalization.  Of Note EEG was done to evaluate for seizures, showed generalized slowing and no seizure activity. Pt was started on mestinon on 5/1. His orthostatics were negative yesterday , but unfortunately he was symptomatic this am .  We have stopped the labetolol and started him on low dose norvasc.  Nephrology consultation requested from Dr Arlean Hopping for further recommendations.   . Diabetetic peripheral neuropathy; resume home medications.  . Anemia of chronic disease;s table . 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 We have stopped reglan and will watch him for recurrent tremors.  . 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   Diabetes mellitus:  CBG (last 3)   Recent Labs  03/20/13 2217 03/21/13 0727 03/21/13 1122  GLUCAP 92 90 92     Resume home medications and SSI .  Elevated d dimer: negative dopplers and NM scan show low probability for PE. Stop therapeutic dose of lovenox and start him on prophylactic dose.   Renal transplant: on chronic immunosuppression.   Obstructive sleep apnea: on CPAP .  DVT prophylaxis.    Code Status: full code Family Communication: none at bedside Disposition Plan: pending    Consultants:  None. HPI/Subjective: Comfortable. But appears  Frustrated.  Objective: Filed Vitals:   03/21/13 1020 03/21/13 1022 03/21/13 1309 03/21/13 1310  BP: 152/80 104/66 167/81 136/80  Pulse:   71 90  Temp:   98.5 F (36.9 C)   TempSrc:   Oral   Resp:   18 20  Height:      Weight:      SpO2:   100% 99%    Intake/Output Summary (Last 24 hours) at 03/21/13 1722 Last data filed at 03/21/13 1422  Gross per 24 hour  Intake    720 ml  Output    525 ml  Net    195 ml   Filed Weights   03/15/13 0342  Weight: 114 kg (251 lb 5.2 oz)    Exam: Alert afebrile comfortable.  CHEST: Normal respiration, clear to auscultation bilaterally.  HEART: Regular rate and rhythm. There are no murmur, rub, or gallops.  BACK:  no CVA tenderness  ABDOMEN: soft and non-tender; no masses, no organomegaly, normal abdominal bowel sounds; no pannus; no intertriginous candida. There is no rebound and no distention.  EXTREMITIES:trace edema.   Data Reviewed: Basic Metabolic Panel:  Recent Labs Lab 03/14/13 2142 03/15/13 0500 03/16/13 0515  NA 139 137 136  K 4.8 4.4 4.4  CL 112 104 103  CO2  --  19 21  GLUCOSE 110* 182* 166*  BUN 38* 35* 36*  CREATININE 2.40* 2.13*  2.14* 1.72*  CALCIUM  --  8.9 9.0   Liver Function Tests: No results found for this  basename: AST, ALT, ALKPHOS, BILITOT, PROT, ALBUMIN,  in the last 168 hours No results found for this basename: LIPASE, AMYLASE,  in the last 168 hours No results found for this basename: AMMONIA,  in the last 168 hours CBC:  Recent Labs Lab 03/15/13 0006 03/15/13 0500 03/16/13 0515 03/18/13 0449 03/21/13 0450  WBC 4.1 4.5 4.8 5.9 6.0  NEUTROABS 2.0  --   --   --   --   HGB 9.3* 9.6* 9.4* 9.3* 9.5*  HCT 29.5* 30.8* 30.2* 30.4* 31.0*  MCV 83.1 83.7 82.1 83.3 83.8  PLT 285 294 286 310 308   Cardiac Enzymes:  Recent Labs Lab  03/15/13 0006  TROPONINI <0.30   BNP (last 3 results)  Recent Labs  12/01/12 0943 03/06/13 0100  PROBNP 4046.0* 761.9*   CBG:  Recent Labs Lab 03/20/13 1110 03/20/13 1714 03/20/13 2217 03/21/13 0727 03/21/13 1122  GLUCAP 93 96 92 90 92    Recent Results (from the past 240 hour(s))  URINE CULTURE     Status: None   Collection Time    03/18/13  5:00 AM      Result Value Range Status   Specimen Description URINE, CLEAN CATCH   Final   Special Requests bactrim Immunocompromised   Final   Culture  Setup Time 03/18/2013 10:37   Final   Colony Count 20,OOO COLONIES/ML   Final   Culture     Final   Value: Multiple bacterial morphotypes present, none predominant. Suggest appropriate recollection if clinically indicated.   Report Status 03/20/2013 FINAL   Final     Studies: No results found.  Scheduled Meds: . amLODipine  5 mg Oral Daily  . aspirin EC  81 mg Oral q morning - 10a  . cycloSPORINE modified  100 mg Oral BID  . cycloSPORINE modified  50 mg Oral BID  . enoxaparin (LOVENOX) injection  50 mg Subcutaneous Q24H  . fluticasone  1 spray Each Nare Daily  . gabapentin  300 mg Oral BID  . insulin aspart  0-9 Units Subcutaneous TID WC  . insulin detemir  12 Units Subcutaneous QHS  . Liraglutide  1.2 mg Subcutaneous Daily  . mycophenolate  500 mg Oral BID  . pantoprazole  40 mg Oral Daily  . pravastatin  20 mg Oral QPC supper  . pyridostigmine  30 mg Oral TID PC  . sodium chloride  3 mL Intravenous Q12H  . sulfamethoxazole-trimethoprim  1 tablet Oral Q M,W,F   Continuous Infusions:   Principal Problem:   Syncope Active Problems:   Diabetetic peripheral neuropathy   History of renal transplant   Orthostatic syncope   Anemia of chronic disease   Diabetes mellitus with renal manifestations, controlled   Obesity (BMI 30-39.9)   Action tremor        [name]  [facility] Pager [phone]. If 7PM-7AM, please contact night-coverage at  [email or web address], password [facility] 03/21/2013, 5:22 PM  LOS: 7 days

## 2013-03-21 NOTE — Progress Notes (Signed)
Subjective:  Sitting up in chair earlier and less dizzy.  Still with orthostatic drop.  Not SOB.  Objective:  Vital Signs in the last 24 hours: BP 154/76  Pulse 71  Temp(Src) 98.4 F (36.9 C) (Oral)  Resp 18  Ht 6\' 1"  (1.854 m)  Wt 114 kg (251 lb 5.2 oz)  BMI 33.17 kg/m2  SpO2 100%  Physical Exam: Pleasant black male in no acute distress Lungs:  Clear Cardiac:  Regular rhythm, normal S1 and S2, no S3 Abdomen:  Soft, nontender, no masses Extremities: Trace edema  Intake/Output from previous day: 05/04 0701 - 05/05 0700 In: 250 [P.O.:240; I.V.:10] Out: -  Weight Filed Weights   03/15/13 0342  Weight: 114 kg (251 lb 5.2 oz)    Lab Results:  CBC:  Recent Labs  03/21/13 0450  WBC 6.0  HGB 9.5*  HCT 31.0*  MCV 83.8  PLT 308    BNP    Component Value Date/Time   PROBNP 761.9* 03/06/2013 0100    Telemetry: Sinus rhythm with PVC's  Assessment/Plan:  1. Significant orthostatic hypotension-likely due to autonomic diabetic neuropathy  2. Stage 3-4 chronic kidney disease 3. Hypertensive heart disease 4. Diabetes mellitus with multiple complications  Recommendations:  Discussed with Dr. Blake Divine.  I recommended nephrology consult and see that Dr. Arta Silence has seen.   1. Patient needs to be fitted with waist high support stockings perhaps this can be done at the hospital 2. Sleep with HOB elevated 3. Increase mestinon to 60 mg 4. Agree with Dr. Arta Silence comments re use  florinef and would defer to renal re florinef 5. Fluid load first thing in am 6. BP recommendations per renal avoid diuretics, beta blockers, labetolol, clonidine and alpha blockers.  Plan is to use captopril which they will monitor 7.  IF significant orthostasis occurs during day, then could use midodrine during daylight hours with fluid loading prior to each dose    W. Ashley Royalty  MD [facility] Cardiology  03/21/2013, 10:15 PM

## 2013-03-21 NOTE — Progress Notes (Signed)
Previous note deleted because this information was entered on the incorrect patient.  Elie Goody, PharmD, BCPS Pager: [phone] 03/21/2013  10:21 AM

## 2013-03-21 NOTE — Consult Note (Signed)
[name] 03/21/2013 [name] D Requesting Physician:  Dr Blake Divine  Reason for Consult:   HPI: The patient is a 62 y.o. year-old with hx of long-standing DM, HTN and ESRD. Has gastroparesis on Reglan for years, takes CyA, Cellcept and pred for transplant (LRD from niece in 2004). Tx function stable with baseline creatinine 1.7-2.3. Admitted with syncopal episode in April, lasix was stopped. Back now with recurrent syncope.  Was taking labetalol 150 bid for BP at home and Imdur which have been stopped.   Has had 3 admissions since Jan 2014 for enterobacter UTI, once with assoc bacteremia. Had one admission for syncope and norvasc and flomax were stopped (in Feb 2014). He denies urinary symptoms.   ROS  no CP or SOB  no LE edema     Past Medical History:  Past Medical History  Diagnosis Date  . Diabetes mellitus with renal manifestations, controlled   . Hypertensive heart disease   . Kidney transplant as cause of abnormal reaction or later complication     In [name]-Dr. Colodonato-Dr. Lawerance Cruel is the Transplant dodctor at [facility]  . Hyperlipidemia   . Chronic kidney disease   . Neuromuscular disorder   . Obesity (BMI 30-39.9)   . Anemia of chronic disease 02/05/2013  . Orthostatic syncope 01/10/2013  . Diabetic gastroparesis associated with type 1 diabetes mellitus 03/06/2013  . Retinopathy due to secondary diabetes   . Sleep apnea 02/13/2009    Past Surgical History:  Past Surgical History  Procedure Laterality Date  . Kidney transplant      Family History:  Family History  Problem Relation Age of Onset  . Diabetes Mother   . Diabetes Father   . Kidney failure Brother   . Heart failure Sister    Social History:  reports that he has quit smoking. He has never used smokeless tobacco. He reports that he does not drink alcohol or use illicit drugs.  Allergies:  Allergies  Allergen Reactions  . Iodine Anaphylaxis    Iv dye  . Shellfish Allergy Anaphylaxis and  Swelling    Has throat swelling, tongue swelling, and entire body swells up.     Home medications: Prior to Admission medications   Medication Sig Start Date End Date Taking? Authorizing Provider  aspirin EC 81 MG tablet Take 81 mg by mouth every morning.    Yes Historical Provider, MD  capsaicin (ZOSTRIX) 0.025 % cream Apply 1 application topically 4 (four) times daily - after meals and at bedtime.   Yes Historical Provider, MD  cycloSPORINE modified (NEORAL) 100 MG capsule Take 100 mg by mouth 2 (two) times daily. In addition to 50mg  twice daily for total 150 mg twice daily.   Yes Historical Provider, MD  cycloSPORINE modified (NEORAL) 25 MG capsule Take 50 mg by mouth 2 (two) times daily. In addition to 100mg  po twice daily for total dose of 150mg  twice daily.   Yes Historical Provider, MD  fluticasone (FLONASE) 50 MCG/ACT nasal spray Place 1 spray into the nose daily as needed. Allergies or sinus congestion.   Yes Historical Provider, MD  gabapentin (NEURONTIN) 300 MG capsule Take 1 capsule (300 mg total) by mouth 2 (two) times daily. 01/10/13  Yes [name], MD  HYDROcodone-acetaminophen (NORCO/VICODIN) 5-325 MG per tablet Take 1 tablet by mouth every 6 (six) hours as needed.  02/07/13  Yes Historical Provider, MD  insulin detemir (LEVEMIR) 100 UNIT/ML injection Inject 0.12 mLs (12 Units total) into the skin at bedtime. 03/08/13  Yes Shanker Levora Dredge, MD  labetalol (NORMODYNE) 300 MG tablet Take 150 mg by mouth 2 (two) times daily.   Yes Historical Provider, MD  Liraglutide (VICTOZA) 18 MG/3ML SOLN injection Inject 0.2 mLs (1.2 mg total) into the skin daily. 03/10/13  Yes [name], MD  metoCLOPramide (REGLAN) 10 MG tablet Take 1 tablet (10 mg total) by mouth daily. 01/10/13  Yes [name], MD  mycophenolate (CELLCEPT) 250 MG capsule Take 500 mg by mouth 2 (two) times daily.    Yes Historical Provider, MD  omeprazole (PRILOSEC) 20 MG capsule Take 20 mg by mouth daily.     Yes  Historical Provider, MD  pravastatin (PRAVACHOL) 20 MG tablet Take 1 tablet (20 mg total) by mouth daily after supper. 03/08/13  Yes Shanker Levora Dredge, MD  predniSONE (DELTASONE) 5 MG tablet Take 5 mg by mouth daily.   Yes Historical Provider, MD  sulfamethoxazole-trimethoprim (BACTRIM,SEPTRA) 400-80 MG per tablet Take 1 tablet by mouth every Monday, Wednesday, and Friday.   Yes Historical Provider, MD    Labs: Basic Metabolic Panel:  Recent Labs Lab 03/14/13 2142 03/15/13 0500 03/16/13 0515  NA 139 137 136  K 4.8 4.4 4.4  CL 112 104 103  CO2  --  19 21  GLUCOSE 110* 182* 166*  BUN 38* 35* 36*  CREATININE 2.40* 2.13*  2.14* 1.72*  CALCIUM  --  8.9 9.0   Liver Function Tests: No results found for this basename: AST, ALT, ALKPHOS, BILITOT, PROT, ALBUMIN,  in the last 168 hours No results found for this basename: LIPASE, AMYLASE,  in the last 168 hours No results found for this basename: AMMONIA,  in the last 168 hours CBC:  Recent Labs Lab 03/15/13 0006 03/15/13 0500 03/16/13 0515 03/18/13 0449 03/21/13 0450  WBC 4.1 4.5 4.8 5.9 6.0  NEUTROABS 2.0  --   --   --   --   HGB 9.3* 9.6* 9.4* 9.3* 9.5*  HCT 29.5* 30.8* 30.2* 30.4* 31.0*  MCV 83.1 83.7 82.1 83.3 83.8  PLT 285 294 286 310 308   PT/INR: @LABRCNTIP (inr:5) Cardiac Enzymes: ) Recent Labs Lab 03/15/13 0006  TROPONINI <0.30   CBG:  Recent Labs Lab 03/20/13 1714 03/20/13 2217 03/21/13 0727 03/21/13 1122 03/21/13 1636  GLUCAP 96 92 90 92 82     Physical Exam:  Blood pressure 136/80, pulse 90, temperature 98.5 F (36.9 C), temperature source Oral, resp. rate 20, height 6\' 1"  (1.854 m), weight 114 kg (251 lb 5.2 oz), SpO2 99.00%.  Skin: no rash, cyanosis  HEENT: EOMI, sclera anicteric, throat moist  Neck: no JVD, no LAN  Chest: clear bilat no rales  CV: regular, no rub or gallop, no carotid or femoral bruits, pedal pulses intact  Abdomen: soft, obese, nontender, no HSM, no ascites  Ext: trace  ankle edema bilat, no joint effusion or deformity, no gangrene or ulceration  Neuro: alert, Ox3, no focal deficit  Impression/Plan 1 Syncope- suspect orthostatic hypotension due to autonomic neuropathy from longstanding DM.  Recommendations:  - avoid diuretics, nitrates, B-blockers, clonidine, alpha-blockers if possible. Imdur, labetalol already stopped  - use ACEI/ARB, CCB for BP control, but treat standing BP only during day and Rx supine BP at night with short-acting medication as needed (hydralazine, captopril). Will d/c norvasc for now and use captopril tid prn for SBP > 140; standing BP should be used during the day and supine BP should be used at bedtime   - agree with EPO since pt anemic,  but pt concerned about cost of the drug so will hold off for now and look into that  - would not use fluorinef with CKD at risk for vol overload; not candidate for NSAID's due to CKD  - midodrine a consideration but supine HTN at night is problematic side effect  - condition may or may not respond well to treatment, discussed with pt  2 Renal transplant with stage III CKD. Stable creat 1.8 3 DM, longstanding 4 Hx recurrent UTI's   Vinson Moselle  MD [facility] [phone] pgr     [phone] cell 03/21/2013, 5:56 PM

## 2013-03-22 LAB — IRON AND TIBC
Iron: 24 ug/dL — ABNORMAL LOW (ref 42–135)
Saturation Ratios: 15 % — ABNORMAL LOW (ref 20–55)
TIBC: 159 ug/dL — ABNORMAL LOW (ref 215–435)
UIBC: 135 ug/dL (ref 125–400)

## 2013-03-22 LAB — GLUCOSE, CAPILLARY
Glucose-Capillary: 97 mg/dL (ref 70–99)
Glucose-Capillary: 106 mg/dL — ABNORMAL HIGH (ref 70–99)

## 2013-03-22 NOTE — Progress Notes (Signed)
[facility] PROGRESS NOTE  [name] OZH:[id number] DOB: [birth year] DOA: 03/14/2013 PCP: Dr Rene Paci Brief summary:  [name] is an 62 y.o. male with hx of DM2 and autonomic dysfx, hyperlipidemia, neuromuscular disorder, HTN heart disease, recently admitted for syncope with negative work up including negative ECHO and negative stress test, presents to the ER again with another syncopal episode. As per the pt he has been in and out of the hospital for orthostatic syncope over the last few months. Over teh last one week, we have been adjusting his medications to prevent orthostatics. Cardiology and Nephrology on board.    Assessment/Plan:  . Orthostatic syncope: probably from orthostatic hypotension, but i do not see any documentation of orthostatic blood pressures on admission. He was given iv steroids, fluids, compression stockings on admission. He had a recent stress test did not show any reversible ischemia. His LVEF is 53.%. On admission he had an EKG done which showed ventricular bigeminy. Repeat EKG  showed NSR with LVH hypertrophy. His orthostatic blood pressures were positive tpday and I had a lengthy discussion with the family that these recurrent syncope could be secondary to autonomic dysfunction from long standing Diabetes Mellitus. Family requested to see Dr Donnie Aho his cardiologist from prior hospitalization.  Of Note EEG was done to evaluate for seizures, showed generalized slowing and no seizure activity. Pt was started on mestinon on 5/1.    Nephrology consultation requested from Dr Arlean Hopping for further recommendations. He recommended to treat the standing BP in the morning and treat the lying BP in the evening with captopril and not to treat his BP if its less than 140/80 mmhg. Florinef is not indicated.   . Diabetetic peripheral neuropathy; resume home medications.  . Anemia of chronic disease;s table . 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 We have stopped reglan and will watch him for recurrent tremors.  . 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   Diabetes mellitus:  CBG (last 3)   Recent Labs  03/22/13 0740 03/22/13 1224 03/22/13 1637  GLUCAP 97 100* 98     Resume home medications and SSI .  Elevated d dimer: negative dopplers and NM scan show low probability for PE. Stop therapeutic dose of lovenox and start him on prophylactic dose.   Renal transplant: on chronic immunosuppression.   Obstructive sleep apnea: on CPAP .  DVT prophylaxis.   Code Status: full code Family Communication: none at bedside Disposition Plan: pending    Consultants:  None. HPI/Subjective: Comfortable. But appears  Frustrated.  Objective: Filed Vitals:   03/22/13 0845 03/22/13 1351 03/22/13 1627 03/22/13 1628  BP: 115/75 151/77 177/79 147/82  Pulse:  81    Temp:  98.5 F (36.9 C)    TempSrc:  Oral    Resp:  18    Height:      Weight:      SpO2:  99%      Intake/Output Summary (Last 24 hours) at 03/22/13 1833 Last data filed at 03/22/13 1813  Gross per 24 hour  Intake   1200 ml  Output   1675 ml  Net   -475 ml   Filed Weights   03/15/13 0342  Weight: 114 kg (251 lb 5.2 oz)    Exam: Alert afebrile comfortable.  CHEST: Normal respiration, clear to auscultation bilaterally.  HEART: Regular rate and rhythm. There are no murmur, rub, or gallops.  BACK:  no CVA tenderness  ABDOMEN:  soft and non-tender; no masses, no organomegaly, normal abdominal bowel sounds; no pannus; no intertriginous candida. There is no rebound and no distention.  EXTREMITIES:trace edema.   Data Reviewed: Basic Metabolic Panel:  Recent Labs Lab 03/16/13 0515  NA 136  K 4.4  CL 103  CO2 21  GLUCOSE 166*  BUN 36*  CREATININE 1.72*  CALCIUM 9.0   Liver Function Tests: No results found for this basename: AST, ALT, ALKPHOS, BILITOT, PROT, ALBUMIN,  in the last 168 hours No results found for this  basename: LIPASE, AMYLASE,  in the last 168 hours No results found for this basename: AMMONIA,  in the last 168 hours CBC:  Recent Labs Lab 03/16/13 0515 03/18/13 0449 03/21/13 0450  WBC 4.8 5.9 6.0  HGB 9.4* 9.3* 9.5*  HCT 30.2* 30.4* 31.0*  MCV 82.1 83.3 83.8  PLT 286 310 308   Cardiac Enzymes: No results found for this basename: CKTOTAL, CKMB, CKMBINDEX, TROPONINI,  in the last 168 hours BNP (last 3 results)  Recent Labs  12/01/12 0943 03/06/13 0100  PROBNP 4046.0* 761.9*   CBG:  Recent Labs Lab 03/21/13 1636 03/21/13 2119 03/22/13 0740 03/22/13 1224 03/22/13 1637  GLUCAP 82 111* 97 100* 98    Recent Results (from the past 240 hour(s))  URINE CULTURE     Status: None   Collection Time    03/18/13  5:00 AM      Result Value Range Status   Specimen Description URINE, CLEAN CATCH   Final   Special Requests bactrim Immunocompromised   Final   Culture  Setup Time 03/18/2013 10:37   Final   Colony Count 20,OOO COLONIES/ML   Final   Culture     Final   Value: Multiple bacterial morphotypes present, none predominant. Suggest appropriate recollection if clinically indicated.   Report Status 03/20/2013 FINAL   Final     Studies: No results found.  Scheduled Meds: . aspirin EC  81 mg Oral q morning - 10a  . captopril  25 mg Oral BID  . captopril  25 mg Oral QHS  . cycloSPORINE modified  100 mg Oral BID  . cycloSPORINE modified  50 mg Oral BID  . enoxaparin (LOVENOX) injection  50 mg Subcutaneous Q24H  . fluticasone  1 spray Each Nare Daily  . gabapentin  300 mg Oral BID  . insulin aspart  0-9 Units Subcutaneous TID WC  . insulin detemir  12 Units Subcutaneous QHS  . Liraglutide  1.2 mg Subcutaneous Daily  . mycophenolate  500 mg Oral BID  . pantoprazole  40 mg Oral Daily  . pravastatin  20 mg Oral QPC supper  . pyridostigmine  60 mg Oral TID PC  . sodium chloride  3 mL Intravenous Q12H  . sulfamethoxazole-trimethoprim  1 tablet Oral Q M,W,F    Continuous Infusions:   Principal Problem:   Syncope Active Problems:   Diabetetic peripheral neuropathy   History of renal transplant   Orthostatic syncope   Anemia of chronic disease   Diabetes mellitus with renal manifestations, controlled   Obesity (BMI 30-39.9)   Action tremor        [name]  [facility] Pager [phone]. If 7PM-7AM, please contact night-coverage at [email or web address], password [facility] 03/22/2013, 6:33 PM  LOS: 8 days

## 2013-03-22 NOTE — Progress Notes (Signed)
Consult received for waist high ted hose, per nurse secretary none available in the hospital. I called around and found that [facility] on [facility] has some but pt has to be present to be measured. Pt made aware of this.

## 2013-03-22 NOTE — Progress Notes (Signed)
Subjective:  Sitting up in chair at present.  No c/o SOB.  No orthostatics done this am.  Objective:  Vital Signs in the last 24 hours: BP 153/71  Pulse 73  Temp(Src) 98.6 F (37 C) (Oral)  Resp 18  Ht 6\' 1"  (1.854 m)  Wt 114 kg (251 lb 5.2 oz)  BMI 33.17 kg/m2  SpO2 98%  Physical Exam: Pleasant black male in no acute distress Lungs:  Clear Cardiac:  Regular rhythm, normal S1 and S2, no S3 Abdomen:  Soft, nontender, no masses Extremities: Trace edema  Intake/Output from previous day: 05/05 0701 - 05/06 0700 In: 720 [P.O.:720] Out: 1225 [Urine:1225] Weight Filed Weights   03/15/13 0342  Weight: 114 kg (251 lb 5.2 oz)    Lab Results:  CBC:  Recent Labs  03/21/13 0450  WBC 6.0  HGB 9.5*  HCT 31.0*  MCV 83.8  PLT 308    BNP    Component Value Date/Time   PROBNP 761.9* 03/06/2013 0100    Telemetry: Sinus rhythm with PVC's  Assessment/Plan:  1. Significant orthostatic hypotension-likely due to autonomic diabetic neuropathy  2. Stage 3-4 chronic kidney disease 3. Hypertensive heart disease 4. Diabetes mellitus with multiple complications  Recommendations:  See below as written yesterday.   I would ask case maanger to help with fitting for waist high Jobst stockings. Needs orthostatic BP measurements each shift.  1. Patient needs to be fitted with waist high support stockings perhaps this can be done at the hospital 2. Sleep with HOB elevated 3. Increase mestinon to 60 mg 4. Agree with Dr. Arta Silence comments re use  florinef and would defer to renal re florinef 5. Fluid load first thing in am 6. BP recommendations per renal avoid diuretics, beta blockers, labetolol, clonidine and alpha blockers.  Plan is to use captopril which they will monitor 7.  IF significant orthostasis occurs during day, then could use midodrine during daylight hours with fluid loading prior to each dose    W. Ashley Royalty  MD [facility] Cardiology  03/22/2013, 8:22 AM

## 2013-03-22 NOTE — Progress Notes (Signed)
Patient ID: [name], male   DOB: [birth year], 62 y.o.   MRN: [id number] [name] asked that I come by to see him in the hospital and place a steroid injection in his knee due to his severe knee pain that is limiting his mobility.  He is well-known to me and has significant OA in his left knee.  Until is medical condition has improved, surgery will be held.  I did clean his left knee and place an injection of lidocaine and depomedrol into it.  He will follow-up with me in the office.

## 2013-03-22 NOTE — Progress Notes (Signed)
Subjective: No further syncopal episodes. Got one dose of captopril today for standing BP > 140.  AM dose held with standing SBP of 116.    Objective Vital signs in last 24 hours: Filed Vitals:   03/22/13 0845 03/22/13 1351 03/22/13 1627 03/22/13 1628  BP: 115/75 151/77 177/79 147/82  Pulse:  81    Temp:  98.5 F (36.9 C)    TempSrc:  Oral    Resp:  18    Height:      Weight:      SpO2:  99%     Weight change:   Intake/Output Summary (Last 24 hours) at 03/22/13 1717 Last data filed at 03/22/13 1351  Gross per 24 hour  Intake    600 ml  Output   1675 ml  Net  -1075 ml   Labs: Basic Metabolic Panel:  Recent Labs Lab 03/16/13 0515  NA 136  K 4.4  CL 103  CO2 21  GLUCOSE 166*  BUN 36*  CREATININE 1.72*  CALCIUM 9.0   Liver Function Tests: No results found for this basename: AST, ALT, ALKPHOS, BILITOT, PROT, ALBUMIN,  in the last 168 hours No results found for this basename: LIPASE, AMYLASE,  in the last 168 hours No results found for this basename: AMMONIA,  in the last 168 hours CBC:  Recent Labs Lab 03/16/13 0515 03/18/13 0449 03/21/13 0450  WBC 4.8 5.9 6.0  HGB 9.4* 9.3* 9.5*  HCT 30.2* 30.4* 31.0*  MCV 82.1 83.3 83.8  PLT 286 310 308   PT/INR: @LABRCNTIP (inr:5)   Scheduled Meds ) . aspirin EC  81 mg Oral q morning - 10a  . captopril  25 mg Oral BID  . captopril  25 mg Oral QHS  . cycloSPORINE modified  100 mg Oral BID  . cycloSPORINE modified  50 mg Oral BID  . enoxaparin (LOVENOX) injection  50 mg Subcutaneous Q24H  . fluticasone  1 spray Each Nare Daily  . gabapentin  300 mg Oral BID  . insulin aspart  0-9 Units Subcutaneous TID WC  . insulin detemir  12 Units Subcutaneous QHS  . Liraglutide  1.2 mg Subcutaneous Daily  . mycophenolate  500 mg Oral BID  . pantoprazole  40 mg Oral Daily  . pravastatin  20 mg Oral QPC supper  . pyridostigmine  60 mg Oral TID PC  . sodium chloride  3 mL Intravenous Q12H  . sulfamethoxazole-trimethoprim  1  tablet Oral Q M,W,F    Physical Exam:  Blood pressure 147/82, pulse 81, temperature 98.5 F (36.9 C), temperature source Oral, resp. rate 18, height 6\' 1"  (1.854 m), weight 114 kg (251 lb 5.2 oz), SpO2 99.00%.  Skin: no rash, cyanosis  HEENT: EOMI, sclera anicteric, throat moist  Neck: no JVD, no LAN  Chest: clear bilat no rales  CV: regular, no rub or gallop, no carotid or femoral bruits, pedal pulses intact  Abdomen: soft, obese, nontender, no HSM, no ascites  Ext: trace ankle edema bilat, no joint effusion or deformity, no gangrene or ulceration  Neuro: alert, Ox3, no focal deficit   Impression/Plan  1 Syncope- suspect orthostatic hypotension due to autonomic neuropathy from longstanding DM exacerbated by overtreatment of HTN with medication. Avoid BB, clonidine, alpha-blockers, nitrates and diuretics. Labetalol and Imdur stopped. Use ACEI/ARB or CCB's for HTN.  Started captopril (short-acting ACEI) to be taken PRN according to standing BP during the day and supine BP before bedtime. Raise HOB at night 10-20deg, leg stockings if will  tolerate. Wouldn't use flourinef or fluid loading with late stage III CKD. Midodrine a possibility if these measures don't work. Patient will f/u at [facility] after discharge.  Will sign off, call as needed.  2 Renal transplant with stage III CKD. Stable creat 1.8  3 DM, longstanding  4 Hx recurrent Arnoldo Morale  MD [phone] pgr    [phone] cell 03/22/2013, 5:17 PM

## 2013-03-22 NOTE — Progress Notes (Addendum)
Pt seen, already wearing his cpap from home and appears to be tolerating well.  No assistance needed by RT at this time.

## 2013-03-22 NOTE — Progress Notes (Deleted)
Patient discharged home in stable condition.  No change from morning assessment.  Discharge instructions and scripts given to pt with verbal understanding and feedback.  No further questions and concerns at this time.  MyChart active.

## 2013-03-22 NOTE — Progress Notes (Signed)
[facility]  Patient Status: Active (receiving services up to time of hospitalization)  [facility] is providing the following services: RN and PT  If patient discharges after hours, please call [phone].   [name] 03/22/2013, 4:08 PM

## 2013-03-22 NOTE — Progress Notes (Signed)
Pt c/o tremor, states has had them previously but hasnt in a while.  Pt also states doesn't last long at all. Will continue to monitor

## 2013-03-23 ENCOUNTER — Ambulatory Visit: Payer: Medicare HMO | Admitting: Internal Medicine

## 2013-03-23 DIAGNOSIS — G909 Disorder of the autonomic nervous system, unspecified: Secondary | ICD-10-CM

## 2013-03-23 DIAGNOSIS — E1149 Type 2 diabetes mellitus with other diabetic neurological complication: Principal | ICD-10-CM

## 2013-03-23 LAB — BASIC METABOLIC PANEL
CO2: 20 mEq/L (ref 19–32)
Calcium: 8.7 mg/dL (ref 8.4–10.5)
Potassium: 4 mEq/L (ref 3.5–5.1)
Sodium: 134 mEq/L — ABNORMAL LOW (ref 135–145)

## 2013-03-23 LAB — GLUCOSE, CAPILLARY
Glucose-Capillary: 88 mg/dL (ref 70–99)
Glucose-Capillary: 96 mg/dL (ref 70–99)

## 2013-03-23 MED ORDER — CAPTOPRIL 25 MG PO TABS: 25.0000 mg | ORAL_TABLET | Freq: Two times a day (BID) | ORAL | Status: DC

## 2013-03-23 MED ORDER — HYDROCODONE-ACETAMINOPHEN 5-325 MG PO TABS
1.0000 | ORAL_TABLET | Freq: Four times a day (QID) | ORAL | Status: DC | PRN
  Filled 2013-03-23: qty 1

## 2013-03-23 MED ORDER — PYRIDOSTIGMINE BROMIDE 60 MG PO TABS: 60.0000 mg | ORAL_TABLET | Freq: Three times a day (TID) | ORAL | Status: DC

## 2013-03-23 MED ORDER — SULFAMETHOXAZOLE-TRIMETHOPRIM 400-80 MG PO TABS: 1.0000 | ORAL_TABLET | ORAL | Status: AC

## 2013-03-23 NOTE — Discharge Summary (Signed)
Physician Discharge Summary  Patient ID: [name] MRN: [id number] DOB/AGE: 1951-05-12 62 y.o.  Admit date: 03/14/2013 Discharge date: 03/23/2013  Primary Care Physician:  No primary provider on file.   Discharge Diagnoses:    Principal Problem:   Syncope Active Problems:   Diabetic autonomic neuropathy   History of renal transplant   Orthostatic syncope   Anemia of chronic disease   Diabetes mellitus with renal manifestations, controlled   Obesity (BMI 30-39.9)   Action tremor      Medication List    STOP taking these medications       labetalol 300 MG tablet  Commonly known as:  NORMODYNE     sulfamethoxazole-trimethoprim 400-80 MG per tablet  Commonly known as:  BACTRIM,SEPTRA      TAKE these medications       aspirin EC 81 MG tablet  Take 81 mg by mouth every morning.     capsaicin 0.025 % cream  Commonly known as:  ZOSTRIX  Apply 1 application topically 4 (four) times daily - after meals and at bedtime.     captopril 25 MG tablet  Commonly known as:  CAPOTEN  Take 1 tablet (25 mg total) by mouth 2 (two) times daily.     cycloSPORINE modified 25 MG capsule  Commonly known as:  NEORAL  Take 50 mg by mouth 2 (two) times daily. In addition to 100mg  po twice daily for total dose of 150mg  twice daily.     cycloSPORINE modified 100 MG capsule  Commonly known as:  NEORAL  Take 100 mg by mouth 2 (two) times daily. In addition to 50mg  twice daily for total 150 mg twice daily.     fluticasone 50 MCG/ACT nasal spray  Commonly known as:  FLONASE  Place 1 spray into the nose daily as needed. Allergies or sinus congestion.     gabapentin 300 MG capsule  Commonly known as:  NEURONTIN  Take 1 capsule (300 mg total) by mouth 2 (two) times daily.     HYDROcodone-acetaminophen 5-325 MG per tablet  Commonly known as:  NORCO/VICODIN  Take 1 tablet by mouth every 6 (six) hours as needed.     insulin detemir 100 UNIT/ML injection  Commonly known as:  LEVEMIR   Inject 0.12 mLs (12 Units total) into the skin at bedtime.     Liraglutide 18 MG/3ML Soln injection  Commonly known as:  VICTOZA  Inject 0.2 mLs (1.2 mg total) into the skin daily.     metoCLOPramide 10 MG tablet  Commonly known as:  REGLAN  Take 1 tablet (10 mg total) by mouth daily.     mycophenolate 250 MG capsule  Commonly known as:  CELLCEPT  Take 500 mg by mouth 2 (two) times daily.     omeprazole 20 MG capsule  Commonly known as:  PRILOSEC  Take 20 mg by mouth daily.     pravastatin 20 MG tablet  Commonly known as:  PRAVACHOL  Take 1 tablet (20 mg total) by mouth daily after supper.     predniSONE 5 MG tablet  Commonly known as:  DELTASONE  Take 5 mg by mouth daily.     pyridostigmine 60 MG tablet  Commonly known as:  MESTINON  Take 1 tablet (60 mg total) by mouth 3 (three) times daily after meals.         Disposition and Follow-up:  Patient will be discharged home today in stable condition. He has followup appointments scheduled with Dr. Arlean Hopping as  well as with Dr. Donnie Aho.  Consults: Cardiology, Dr. Donnie Aho Nephrology, Dr. Arlean Hopping   Significant Diagnostic Studies:  No results found.  Brief H and P: For complete details please refer to admission H and P, but in brief patient is a 62 y.o. male with hx of DM2 and autonomic dysfx, hyperlipidemia, neuromuscular disorder, HTN heart disease, recently admitted for syncope with negative work up including negative ECHO and negative stress test, presents to the ER again with another syncopal episode. Wife stated that she was feeling lightheaded and passed out. There were no seizure activities, but she noted he had some "tremors" in his left hand and arm, and subsequently had weakness of his left arm. There were no chest pain, palpitation, vertigo, HA, or any other neurological symptoms. He did feel lightheadedness upon standing. It has been more significant this year. He had been on blood pressure meds, along with a  diuretics, but the diuretic was discontinued. He is still on labetelol. The other important piece of information is that he has had renal transplant and has been on suppressive therapy including Prednisone at 5mg  per days for years. Last admission, it was not increased. In the ER, he has frequent bigeminy rhythms. Hospitalist was asked to admit him for recurrent syncopes. His wife has been very concerned about these events.     Hospital Course:  Principal Problem:   Syncope Active Problems:   Diabetic autonomic neuropathy   History of renal transplant   Orthostatic syncope   Anemia of chronic disease   Diabetes mellitus with renal manifestations, controlled   Obesity (BMI 30-39.9)   Action tremor    Recurrent syncope -Presumed secondary to diabetic autonomic neuropathy in addition to over treatment of his hypertension.  Autonomic diabetic neuropathy -Presumed cause of syncope. -He has been fitted with TED hose, however we have recommended waist high stockings which he will need to obtain at the medical supply store following discharge. -Has also been started on Mestinon 60 mg 3 times a day. -We have decided to not use Florinef secondary to concerns for fluid overload in a patient with stage III chronic kidney disease. -Have also discussed lifestyle and environmental changes. Have advised him to avoid circumstances that may result in excess harm to himself or others were he to have a syncopal episode such as driving. -I would give serious consideration to readmitting him to the hospital were he to have recurrent syncopal episodes as I do not see how any further inpatient workup would be of any benefit to him.  Rest of chronic medical conditions have been stable this hospitalization.  Time spent on Discharge: Greater than 30 minutes  Signed: Chaya Jan [facility] Pager: [phone] 03/23/2013, 1:15 PM

## 2013-03-23 NOTE — Progress Notes (Signed)
Voices understanding of d/c instructions no changes noted since am assessment.

## 2013-03-23 NOTE — Progress Notes (Signed)
Physical Therapy Treatment Patient Details Name: [name] MRN: [id number] DOB: 1951/10/02 Today's Date: 03/23/2013 Time: 4098-1191 PT Time Calculation (min): 25 min  PT Assessment / Plan / Recommendation Comments on Treatment Session  pt continues to be very motivated; fatigued after amb, required rest between gait distances; further amb/ther ex not performed due to elevated BP  200/82;    Follow Up Recommendations  Home health PT     Does the patient have the potential to tolerate intense rehabilitation     Barriers to Discharge        Equipment Recommendations  None recommended by PT    Recommendations for Other Services    Frequency Min 3X/week   Plan Discharge plan remains appropriate;Frequency remains appropriate    Precautions / Restrictions Precautions Precautions: Fall Precaution Comments: monitor BP   Pertinent Vitals/Pain     Mobility  Bed Mobility Bed Mobility: Not assessed Transfers Transfers: Sit to Stand;Stand to Sit Sit to Stand: 5: Supervision;With upper extremity assist;From chair/3-in-1 Stand to Sit: 5: Supervision;To chair/3-in-1 Details for Transfer Assistance: cues for  hand placement and wt shift  Ambulation/Gait Ambulation/Gait Assistance: 4: Min guard Ambulation Distance (Feet): 50 Feet (times 2 ) Assistive device: Rolling walker Ambulation/Gait Assistance Details: cues for posture, sequence, position from RW  Gait Pattern: Step-to pattern;Antalgic Gait velocity: slow General Gait Details: pt still wtih L knee pain at 7/10; had lidocaine injection per Dr. Magnus Ivan during this adm    Exercises     PT Diagnosis:    PT Problem List:   PT Treatment Interventions:     PT Goals Acute Rehab PT Goals Time For Goal Achievement: 03/30/13 Potential to Achieve Goals: Good Pt will go Sit to Stand: with modified independence PT Goal: Sit to Stand - Progress: Updated due to goal met Pt will go Stand to Sit: with modified independence Pt  will Ambulate: 51 - 150 feet;with supervision;with rolling walker PT Goal: Ambulate - Progress: Progressing toward goal  Visit Information  Last PT Received On: 03/23/13 Assistance Needed: +1    Subjective Data  Subjective: i am not sure if my knee is better   Cognition  Cognition Arousal/Alertness: Awake/alert Behavior During Therapy: WFL for tasks assessed/performed Overall Cognitive Status: Within Functional Limits for tasks assessed    Balance  Static Standing Balance Static Standing - Balance Support: No upper extremity supported Static Standing - Level of Assistance: 5: Stand by assistance  End of Session PT - End of Session Equipment Utilized During Treatment: Gait belt Activity Tolerance: Treatment limited secondary to medical complications (Comment) Patient left: in chair;with call bell/phone within reach Nurse Communication: Mobility status   GP     [facility] 03/23/2013, 1:09 PM

## 2013-03-23 NOTE — Progress Notes (Signed)
Subjective:  Sitting up in chair at present.  No c/o SOB. BP is better this am.  Objective:  Vital Signs in the last 24 hours: BP 138/78  Pulse 89  Temp(Src) 98.6 F (37 C) (Oral)  Resp 18  Ht 6\' 1"  (1.854 m)  Wt 114 kg (251 lb 5.2 oz)  BMI 33.17 kg/m2  SpO2 100%  Physical Exam: Pleasant black male in no acute distress Lungs:  Clear Cardiac:  Regular rhythm, normal S1 and S2, no S3 Abdomen:  Soft, nontender, no masses Extremities: Trace edema  Intake/Output from previous day: 05/06 0701 - 05/07 0700 In: 960 [P.O.:960] Out: 1575 [Urine:1575] Weight Filed Weights   03/15/13 0342  Weight: 114 kg (251 lb 5.2 oz)    Lab Results:  CBC:  Recent Labs  03/21/13 0450  WBC 6.0  HGB 9.5*  HCT 31.0*  MCV 83.8  PLT 308    BNP    Component Value Date/Time   PROBNP 761.9* 03/06/2013 0100    Telemetry: Sinus rhythm with PVC's  Assessment/Plan:  1. Significant orthostatic hypotension-likely due to autonomic diabetic neuropathy  2. Stage 3-4 chronic kidney disease 3. Hypertensive heart disease 4. Diabetes mellitus with multiple complications  Recommendations:  OK for d/c from cardiac viewpoint.    See below as written for home recommendations. 1. Patient needs to be fitted with waist high support stockings will need to be fitted at [facility] as outpatient 2. Sleep with HOB elevated 3. Increase mestinon to 60 mg 4. Agree with Dr. Arta Silence comments re use  florinef 5. Fluid load first thing in am 16 ounces of liquid first thing in am 6. BP recommendations per renal avoid diuretics, beta blockers, labetolol, clonidine and alpha blockers.  Plan is to use captopril at night for BP greater than 140 and not to use in daytime 7.  IF significant orthostasis occurs during day, then could use midodrine during daylight hours with fluid loading prior to each dose but this will be handled by renal or myself   W. Ashley Royalty  MD  [facility] Cardiology  03/23/2013, 9:08 AM

## 2013-03-23 NOTE — Progress Notes (Signed)
ANTICOAGULATION CONSULT NOTE - Follow Up  Pharmacy Consult for Enoxaparin Indication: VTE prophylaxis  Patient Measurements: Height: 6\' 1"  (185.4 cm) Weight: 251 lb 5.2 oz (114 kg) IBW/kg (Calculated) : 79.9  Labs:  Recent Labs  03/21/13 0450 03/23/13 0510  HGB 9.5*  --   HCT 31.0*  --   PLT 308  --   CREATININE  --  1.60*    Estimated Creatinine Clearance: 62.5 ml/min (by C-G formula based on Cr of 1.6).   Assessment: 62 YO M with recurrent syncope, D-dimer was elevated and concern for PE. History of CKD with renal transplant  5/2 VQ scan shows low probability of PE, therefore lovenox changed to prophylactic dosing: 50mg  q24h (0.5mg /kg q24h for BMI >30) on 5/4.  SCr remains elevated but improved at 1.6, CrCl ~60 ml/min.  No bleeding reported in chart notes, CBC has been stable.  Goal of Therapy:  Monitor platelets by anticoagulation protocol: Yes   Plan:   Continue lovenox 50mg  sq q24h  Pharmacy will sign off as dosage not expected to require adjustment  Loralee Pacas, PharmD, BCPS Pager: [phone] 03/23/2013 7:08 AM

## 2013-03-23 NOTE — Progress Notes (Signed)
Pt asking about a shower chair and bedside commode.  Windell Moulding called and gave me a number to fax info to.  Info faxed to [phone].  Pt also asking about a bp cuff Script given per pt request for bp cuff.  Pt d/c via wheelchair.

## 2013-03-31 ENCOUNTER — Encounter: Payer: Self-pay | Admitting: Internal Medicine

## 2013-03-31 ENCOUNTER — Ambulatory Visit (INDEPENDENT_AMBULATORY_CARE_PROVIDER_SITE_OTHER): Payer: Medicare HMO | Admitting: Internal Medicine

## 2013-03-31 VITALS — BP 154/72 | HR 81 | Temp 98.3°F

## 2013-03-31 DIAGNOSIS — J302 Other seasonal allergic rhinitis: Secondary | ICD-10-CM

## 2013-03-31 DIAGNOSIS — E785 Hyperlipidemia, unspecified: Secondary | ICD-10-CM

## 2013-03-31 DIAGNOSIS — M25569 Pain in unspecified knee: Secondary | ICD-10-CM

## 2013-03-31 DIAGNOSIS — J309 Allergic rhinitis, unspecified: Secondary | ICD-10-CM

## 2013-03-31 DIAGNOSIS — E1129 Type 2 diabetes mellitus with other diabetic kidney complication: Secondary | ICD-10-CM

## 2013-03-31 DIAGNOSIS — E669 Obesity, unspecified: Secondary | ICD-10-CM

## 2013-03-31 DIAGNOSIS — N058 Unspecified nephritic syndrome with other morphologic changes: Secondary | ICD-10-CM

## 2013-03-31 DIAGNOSIS — I119 Hypertensive heart disease without heart failure: Secondary | ICD-10-CM

## 2013-03-31 DIAGNOSIS — M25562 Pain in left knee: Secondary | ICD-10-CM

## 2013-03-31 MED ORDER — HYDROCODONE-ACETAMINOPHEN 10-325 MG PO TABS: 1.0000 | ORAL_TABLET | Freq: Three times a day (TID) | ORAL | Status: AC | PRN

## 2013-03-31 MED ORDER — OLOPATADINE HCL 0.2 % OP SOLN: 1.0000 [drp] | Freq: Every day | OPHTHALMIC | Status: AC

## 2013-03-31 NOTE — Progress Notes (Signed)
HPI  Pt presents to the clinic today to establish care. He is transferring care from Dr. Vonda Antigua office. He does have multiple chronic medical conditions. His blood pressure is elevated. At his most recent hospital visit for orthostatic hypotension, he was seen by Dr. Donnie Aho. The pt prefers to follow up with [facility] as he was a patient there some years ago. He would like a referral back to them today. He also has DM1 with renal, opthalmic and GI issues. He is being followed by Dr. Lafe Garin for this. He does have some c/o the pain in his left knee. This is a chronic problem for him. His orthopedist Dr. Magnus Ivan is preparing to do surgery on his knee but needs medical clearance for his blood pressure and diabetes first. His pain is not well controlled. He takes Norco 5-325 and pops them like they are aspirin without much relief. Dr. Magnus Ivan is not providing his pain medication. He will need a refill of that today.  Flu: 2013 Pneumovax: 2013 Zostavax: never Eye doctor: yearly Dentist: no (no teeth) Colonoscopy: 2008 Foot exam: 2014  Past Medical History  Diagnosis Date  . Diabetes mellitus with renal manifestations, controlled   . Hypertensive heart disease   . Kidney transplant as cause of abnormal reaction or later complication     In [name]-Dr. Colodonato-Dr. Lawerance Cruel is the Transplant dodctor at [facility]  . Hyperlipidemia   . Chronic kidney disease   . Neuromuscular disorder   . Obesity (BMI 30-39.9)   . Anemia of chronic disease 02/05/2013  . Orthostatic syncope 01/10/2013  . Diabetic gastroparesis associated with type 1 diabetes mellitus 03/06/2013  . Retinopathy due to secondary diabetes   . Sleep apnea 02/13/2009  . Arthritis   . History of chicken pox     Current Outpatient Prescriptions  Medication Sig Dispense Refill  . aspirin EC 81 MG tablet Take 81 mg by mouth every morning.       . BD PEN NEEDLE NANO U/F 32G X 4 MM MISC       . capsaicin (ZOSTRIX) 0.025 % cream  Apply 1 application topically 4 (four) times daily - after meals and at bedtime.      . captopril (CAPOTEN) 25 MG tablet Take 1 tablet (25 mg total) by mouth 2 (two) times daily.  60 tablet  1  . fluticasone (FLONASE) 50 MCG/ACT nasal spray Place 1 spray into the nose daily as needed. Allergies or sinus congestion.      . gabapentin (NEURONTIN) 300 MG capsule Take 1 capsule (300 mg total) by mouth 2 (two) times daily.  180 capsule  3  . HYDROcodone-acetaminophen (NORCO/VICODIN) 5-325 MG per tablet Take 1 tablet by mouth every 6 (six) hours as needed.       . insulin detemir (LEVEMIR) 100 UNIT/ML injection Inject 0.12 mLs (12 Units total) into the skin at bedtime.  10 mL    . Liraglutide (VICTOZA) 18 MG/3ML SOLN injection Inject 0.2 mLs (1.2 mg total) into the skin daily.  6 mL  2  . mycophenolate (CELLCEPT) 250 MG capsule Take 500 mg by mouth 2 (two) times daily.       Marland Kitchen omeprazole (PRILOSEC) 20 MG capsule Take 20 mg by mouth daily.        . pravastatin (PRAVACHOL) 20 MG tablet Take 1 tablet (20 mg total) by mouth daily after supper.  30 tablet  0  . predniSONE (DELTASONE) 5 MG tablet Take 5 mg by mouth daily.      Marland Kitchen  pyridostigmine (MESTINON) 60 MG tablet Take 1 tablet (60 mg total) by mouth 3 (three) times daily after meals.  90 tablet  2  . sulfamethoxazole-trimethoprim (BACTRIM,SEPTRA) 400-80 MG per tablet Take 1 tablet by mouth every Monday, Wednesday, and Friday.      . cycloSPORINE modified (NEORAL) 100 MG capsule Take 100 mg by mouth 2 (two) times daily. In addition to 50mg  twice daily for total 150 mg twice daily.      . cycloSPORINE modified (NEORAL) 25 MG capsule Take 50 mg by mouth 2 (two) times daily. In addition to 100mg  po twice daily for total dose of 150mg  twice daily.       No current facility-administered medications for this visit.    Allergies  Allergen Reactions  . Iodine Anaphylaxis    Iv dye  . Shellfish Allergy Anaphylaxis and Swelling    Has throat swelling, tongue  swelling, and entire body swells up.     Family History  Problem Relation Age of Onset  . Diabetes Mother   . Diabetes Father   . Kidney failure Brother   . Heart disease Brother   . Heart failure Sister   . Kidney failure Sister   . Heart disease Sister   . Prostate cancer Paternal Uncle   . Cancer Neg Hx   . Kidney failure Brother   . Kidney failure Brother   . Hyperlipidemia Other     Family History  . Hypertension Other     Family History  . Diabetes Other     Family History    History   Social History  . Marital Status: Married    Spouse Name: N/A    Number of Children: N/A  . Years of Education: 16   Occupational History  . Retired     Art gallery manager   Social History Main Topics  . Smoking status: Former Games developer  . Smokeless tobacco: Never Used  . Alcohol Use: No  . Drug Use: No  . Sexually Active: No   Other Topics Concern  . Not on file   Social History Narrative   Retired and on disability-since 1999 for Renal failure   Is originally from [place] for 38 yrs-came back here in 2002   Use dto smoke and drink only until age of 23 or 85 yrs of age   Past Marijuana user       Regular exercise-no   Caffeine Use-yes    ROS:  Constitutional: Pt reports fatigue. Denies fever, malaise, headache or abrupt weight changes.  HEENT: Pt reports eye redness. Denies eye pain, ear pain, ringing in the ears, wax buildup, runny nose, nasal congestion, bloody nose, or sore throat. Respiratory: Denies difficulty breathing, shortness of breath, cough or sputum production.   Cardiovascular: Pt reports swelling in the feet. Denies chest pain, chest tightness, palpitations or swelling in the hands.  Gastrointestinal: Denies abdominal pain, bloating, constipation, diarrhea or blood in the stool.  GU: Denies frequency, urgency, pain with urination, blood in urine, odor or discharge. Musculoskeletal: Pt reports pain in left knee and right elbow swelling. Denies muscle pain.  Skin: Pt  reports acne. Denies redness, rashes, lesions or ulcercations.  Neurological: Denies dizziness, difficulty with memory, difficulty with speech.   No other specific complaints in a complete review of systems (except as listed in HPI above).  PE:  BP 154/72  Pulse 81  Temp(Src) 98.3 F (36.8 C) (Oral)  SpO2 97% Wt Readings from Last 3 Encounters:  03/15/13 251 lb 5.2  oz (114 kg)  03/05/13 257 lb (116.574 kg)  03/01/13 257 lb (116.574 kg)    General: Appears his stated age, obese but well developed, well nourished in NAD. Skin: folliculitis noted on chest, skin tags noted on nape of neck, otherwise clean, dry and intact. HEENT: Head: normal shape and size; Eyes: sclera injected, no icterus, conjunctiva pink, PERRLA and EOMs intact; Ears: Tm's gray and intact, normal light reflex; Nose: mucosa pink and moist, septum midline; Throat/Mouth: Teeth absent, mucosa pink and moist, no lesions or ulcerations noted.  Neck: Normal range of motion. Neck supple, trachea midline. No massses, lumps. Mild thyromegaly but no nodules noted.  Cardiovascular: Normal rate and rhythm. S1,S2 noted.  No murmur, rubs or gallops noted. No JVD or BLE edema. No carotid bruits noted. Pulmonary/Chest: Normal effort and positive vesicular breath sounds. No respiratory distress. No wheezes, rales or ronchi noted.  Abdomen: Soft and nontender. Normal bowel sounds, no bruits noted. No distention or masses noted. Liver, spleen and kidneys non palpable. Musculoskeletal: Decreased flexion of the left knee. Small right elbow effusion. Using wheelchair for assistance d/t pain in left knee.  Neurological: Alert and oriented. Cranial nerves II-XII intact. Coordination normal. +DTRs bilaterally. Sensation in feet decreased bilaterally Psychiatric: Mood and affect normal. Behavior is normal. Judgment and thought content normal.     BMET    Component Value Date/Time   NA 134* 03/23/2013 0510   K 4.0 03/23/2013 0510   CL 104  03/23/2013 0510   CO2 20 03/23/2013 0510   GLUCOSE 90 03/23/2013 0510   BUN 28* 03/23/2013 0510   CREATININE 1.60* 03/23/2013 0510   CALCIUM 8.7 03/23/2013 0510   GFRNONAA 44* 03/23/2013 0510   GFRAA 51* 03/23/2013 0510    Lipid Panel  No results found for this basename: chol, trig, hdl, cholhdl, vldl, ldlcalc    CBC    Component Value Date/Time   WBC 6.0 03/21/2013 0450   RBC 3.70* 03/21/2013 0450   HGB 9.5* 03/21/2013 0450   HCT 31.0* 03/21/2013 0450   PLT 308 03/21/2013 0450   MCV 83.8 03/21/2013 0450   MCH 25.7* 03/21/2013 0450   MCHC 30.6 03/21/2013 0450   RDW 14.0 03/21/2013 0450   LYMPHSABS 1.4 03/15/2013 0006   MONOABS 0.5 03/15/2013 0006   EOSABS 0.1 03/15/2013 0006   BASOSABS 0.0 03/15/2013 0006    Hgb A1C Lab Results  Component Value Date   HGBA1C 6.7* 03/06/2013     Assessment and Plan:

## 2013-03-31 NOTE — Patient Instructions (Signed)
Health Maintenance, Males A healthy lifestyle and preventative care can promote health and wellness.  Maintain regular health, dental, and eye exams.  Eat a healthy diet. Foods like vegetables, fruits, whole grains, low-fat dairy products, and lean protein foods contain the nutrients you need without too many calories. Decrease your intake of foods high in solid fats, added sugars, and salt. Get information about a proper diet from your caregiver, if necessary.  Regular physical exercise is one of the most important things you can do for your health. Most adults should get at least 150 minutes of moderate-intensity exercise (any activity that increases your heart rate and causes you to sweat) each week. In addition, most adults need muscle-strengthening exercises on 2 or more days a week.   Maintain a healthy weight. The body mass index (BMI) is a screening tool to identify possible weight problems. It provides an estimate of body fat based on height and weight. Your caregiver can help determine your BMI, and can help you achieve or maintain a healthy weight. For adults 20 years and older:  A BMI below 18.5 is considered underweight.  A BMI of 18.5 to 24.9 is normal.  A BMI of 25 to 29.9 is considered overweight.  A BMI of 30 and above is considered obese.  Maintain normal blood lipids and cholesterol by exercising and minimizing your intake of saturated fat. Eat a balanced diet with plenty of fruits and vegetables. Blood tests for lipids and cholesterol should begin at age 20 and be repeated every 5 years. If your lipid or cholesterol levels are high, you are over 50, or you are a high risk for heart disease, you may need your cholesterol levels checked more frequently.Ongoing high lipid and cholesterol levels should be treated with medicines, if diet and exercise are not effective.  If you smoke, find out from your caregiver how to quit. If you do not use tobacco, do not start.  If you  choose to drink alcohol, do not exceed 2 drinks per day. One drink is considered to be 12 ounces (355 mL) of beer, 5 ounces (148 mL) of wine, or 1.5 ounces (44 mL) of liquor.  Avoid use of street drugs. Do not share needles with anyone. Ask for help if you need support or instructions about stopping the use of drugs.  High blood pressure causes heart disease and increases the risk of stroke. Blood pressure should be checked at least every 1 to 2 years. Ongoing high blood pressure should be treated with medicines if weight loss and exercise are not effective.  If you are 45 to 63 years old, ask your caregiver if you should take aspirin to prevent heart disease.  Diabetes screening involves taking a blood sample to check your fasting blood sugar level. This should be done once every 3 years, after age 45, if you are within normal weight and without risk factors for diabetes. Testing should be considered at a younger age or be carried out more frequently if you are overweight and have at least 1 risk factor for diabetes.  Colorectal cancer can be detected and often prevented. Most routine colorectal cancer screening begins at the age of 50 and continues through age 75. However, your caregiver may recommend screening at an earlier age if you have risk factors for colon cancer. On a yearly basis, your caregiver may provide home test kits to check for hidden blood in the stool. Use of a small camera at the end of a tube,   to directly examine the colon (sigmoidoscopy or colonoscopy), can detect the earliest forms of colorectal cancer. Talk to your caregiver about this at age 50, when routine screening begins. Direct examination of the colon should be repeated every 5 to 10 years through age 75, unless early forms of pre-cancerous polyps or small growths are found.  Hepatitis C blood testing is recommended for all people born from 1945 through 1965 and any individual with known risks for hepatitis C.  Healthy  men should no longer receive prostate-specific antigen (PSA) blood tests as part of routine cancer screening. Consult with your caregiver about prostate cancer screening.  Testicular cancer screening is not recommended for adolescents or adult males who have no symptoms. Screening includes self-exam, caregiver exam, and other screening tests. Consult with your caregiver about any symptoms you have or any concerns you have about testicular cancer.  Practice safe sex. Use condoms and avoid high-risk sexual practices to reduce the spread of sexually transmitted infections (STIs).  Use sunscreen with a sun protection factor (SPF) of 30 or greater. Apply sunscreen liberally and repeatedly throughout the day. You should seek shade when your shadow is shorter than you. Protect yourself by wearing long sleeves, pants, a wide-brimmed hat, and sunglasses year round, whenever you are outdoors.  Notify your caregiver of new moles or changes in moles, especially if there is a change in shape or color. Also notify your caregiver if a mole is larger than the size of a pencil eraser.  A one-time screening for abdominal aortic aneurysm (AAA) and surgical repair of large AAAs by sound wave imaging (ultrasonography) is recommended for ages 65 to 75 years who are current or former smokers.  Stay current with your immunizations. Document Released: 05/01/2008 Document Revised: 01/26/2012 Document Reviewed: 03/31/2011 [employer] Patient Information 2013 [employer].  

## 2013-04-01 DIAGNOSIS — M25562 Pain in left knee: Secondary | ICD-10-CM | POA: Insufficient documentation

## 2013-04-01 DIAGNOSIS — J302 Other seasonal allergic rhinitis: Secondary | ICD-10-CM | POA: Insufficient documentation

## 2013-04-01 NOTE — Assessment & Plan Note (Signed)
Will obtain labs from previous provider Continue to work on diet changes Continue current therapy at this time

## 2013-04-01 NOTE — Assessment & Plan Note (Signed)
Most bothersome symptoms is the eyes Start pataday for red, itchy eyes Continue flonase

## 2013-04-01 NOTE — Assessment & Plan Note (Signed)
Will defer to Dr. Dan Europe for management

## 2013-04-01 NOTE — Assessment & Plan Note (Signed)
Pending surgery and medical clearance Pain not well controlled Increase RX to 10-325, TID prn, # 90, no early refills

## 2013-04-01 NOTE — Assessment & Plan Note (Signed)
Pt unable to exercise secondary to left knee pain Encouraged pt to focus on diet changes and portion control

## 2013-04-01 NOTE — Assessment & Plan Note (Signed)
Moderately controlled Will not add another medication at this time secondary to orthostatic hypotension and syncope Record BP over next month and RTC in 1 month for f/u

## 2013-04-04 ENCOUNTER — Encounter: Payer: Self-pay | Admitting: Cardiology

## 2013-04-28 ENCOUNTER — Other Ambulatory Visit (HOSPITAL_COMMUNITY): Payer: Self-pay | Admitting: *Deleted

## 2013-04-29 ENCOUNTER — Encounter (HOSPITAL_COMMUNITY)
Admission: RE | Admit: 2013-04-29 | Discharge: 2013-04-29 | Disposition: A | Discharge status: Home / Self Care | Payer: Medicare HMO | Source: Ambulatory Visit | Attending: Nephrology | Admitting: Nephrology

## 2013-04-29 DIAGNOSIS — E1129 Type 2 diabetes mellitus with other diabetic kidney complication: Secondary | ICD-10-CM | POA: Insufficient documentation

## 2013-04-29 DIAGNOSIS — N058 Unspecified nephritic syndrome with other morphologic changes: Secondary | ICD-10-CM | POA: Insufficient documentation

## 2013-04-29 DIAGNOSIS — D638 Anemia in other chronic diseases classified elsewhere: Secondary | ICD-10-CM | POA: Insufficient documentation

## 2013-04-29 MED ORDER — FERUMOXYTOL INJECTION 510 MG/17 ML
1020.0000 mg | Freq: Once | INTRAVENOUS | Status: AC
  Administered 2013-04-29: 1020 mg via INTRAVENOUS
  Filled 2013-04-29: qty 34

## 2013-05-02 ENCOUNTER — Ambulatory Visit (INDEPENDENT_AMBULATORY_CARE_PROVIDER_SITE_OTHER): Payer: Medicare HMO | Admitting: Internal Medicine

## 2013-05-02 ENCOUNTER — Encounter: Payer: Self-pay | Admitting: Internal Medicine

## 2013-05-02 VITALS — BP 148/72 | HR 83 | Temp 97.7°F | Ht 73.0 in | Wt 245.0 lb

## 2013-05-02 DIAGNOSIS — I951 Orthostatic hypotension: Secondary | ICD-10-CM

## 2013-05-02 DIAGNOSIS — I119 Hypertensive heart disease without heart failure: Secondary | ICD-10-CM

## 2013-05-02 NOTE — Assessment & Plan Note (Addendum)
Better controlled since starting pyridostigmine Continue current medication at this time

## 2013-05-02 NOTE — Assessment & Plan Note (Signed)
Better, No episodes since last visit Continue current meds

## 2013-05-02 NOTE — Patient Instructions (Signed)

## 2013-05-02 NOTE — Progress Notes (Signed)
Subjective:    Patient ID: [name], male    DOB: Mar 10, 1951, 62 y.o.   MRN: [id number]  HPI  Pt presents to the clinic today for 1 month f/u HTN. He has not been monitoring his blood pressure because he does not have a machine. He did call Dr. Arlyn Leak who put him on pyridostigmine for his orthostatic hypotensive episodes. He has not had any further episodes since starting this medication. He feels good. He is working on diet but is not able to exercise secondary to the left knee pain. He hopes to be having surgery on it soon. He denies blurred vision, headache, chest pain or chest tightness.   Review of Systems  Past Medical History  Diagnosis Date  . Diabetes mellitus with renal manifestations, controlled   . Hypertensive heart disease   . Kidney transplant as cause of abnormal reaction or later complication     In [name]-Dr. Colodonato-Dr. Lawerance Cruel is the Transplant dodctor at [facility]  . Hyperlipidemia   . Chronic kidney disease   . Obesity (BMI 30-39.9)   . Anemia of chronic disease 02/05/2013  . Orthostatic syncope 01/10/2013  . Diabetic gastroparesis associated with type 1 diabetes mellitus 03/06/2013  . Retinopathy due to secondary diabetes   . Sleep apnea 02/13/2009  . Arthritis   . History of chicken pox     Current Outpatient Prescriptions  Medication Sig Dispense Refill  . aspirin EC 81 MG tablet Take 81 mg by mouth every morning.       . BD PEN NEEDLE NANO U/F 32G X 4 MM MISC       . capsaicin (ZOSTRIX) 0.025 % cream Apply 1 application topically 4 (four) times daily - after meals and at bedtime.      . captopril (CAPOTEN) 25 MG tablet Take 1 tablet (25 mg total) by mouth 2 (two) times daily.  60 tablet  1  . cycloSPORINE modified (NEORAL) 100 MG capsule Take 100 mg by mouth 2 (two) times daily. In addition to 50mg  twice daily for total 150 mg twice daily.      . cycloSPORINE modified (NEORAL) 25 MG capsule Take 50 mg by mouth 2 (two) times daily. In addition to  100mg  po twice daily for total dose of 150mg  twice daily.      . fluticasone (FLONASE) 50 MCG/ACT nasal spray Place 1 spray into the nose daily as needed. Allergies or sinus congestion.      . gabapentin (NEURONTIN) 300 MG capsule Take 1 capsule (300 mg total) by mouth 2 (two) times daily.  180 capsule  3  . HYDROcodone-acetaminophen (NORCO) 10-325 MG per tablet Take 1 tablet by mouth every 8 (eight) hours as needed for pain.  90 tablet  0  . insulin detemir (LEVEMIR) 100 UNIT/ML injection Inject 0.12 mLs (12 Units total) into the skin at bedtime.  10 mL    . Liraglutide (VICTOZA) 18 MG/3ML SOLN injection Inject 0.2 mLs (1.2 mg total) into the skin daily.  6 mL  2  . mycophenolate (CELLCEPT) 250 MG capsule Take 500 mg by mouth 2 (two) times daily.       . Olopatadine HCl 0.2 % SOLN Apply 1 drop to eye daily.  2.5 mL  0  . omeprazole (PRILOSEC) 20 MG capsule Take 20 mg by mouth daily.        . pravastatin (PRAVACHOL) 20 MG tablet Take 1 tablet (20 mg total) by mouth daily after supper.  30 tablet  0  .  predniSONE (DELTASONE) 5 MG tablet Take 5 mg by mouth daily.      Marland Kitchen pyridostigmine (MESTINON) 60 MG tablet Take 1 tablet (60 mg total) by mouth 3 (three) times daily after meals.  90 tablet  2  . sulfamethoxazole-trimethoprim (BACTRIM,SEPTRA) 400-80 MG per tablet Take 1 tablet by mouth every Monday, Wednesday, and Friday.       No current facility-administered medications for this visit.    Allergies  Allergen Reactions  . Iodine Anaphylaxis    Iv dye  . Shellfish Allergy Anaphylaxis and Swelling    Has throat swelling, tongue swelling, and entire body swells up.     Family History  Problem Relation Age of Onset  . Diabetes Mother   . Diabetes Father   . Kidney failure Brother   . Heart disease Brother   . Heart failure Sister   . Kidney failure Sister   . Heart disease Sister   . Prostate cancer Paternal Uncle   . Cancer Neg Hx   . Kidney failure Brother   . Kidney failure Brother    . Hyperlipidemia Other     Family History  . Hypertension Other     Family History  . Diabetes Other     Family History    History   Social History  . Marital Status: Married    Spouse Name: N/A    Number of Children: N/A  . Years of Education: 16   Occupational History  . Retired     Art gallery manager   Social History Main Topics  . Smoking status: Former Games developer  . Smokeless tobacco: Never Used  . Alcohol Use: No  . Drug Use: No  . Sexually Active: No   Other Topics Concern  . Not on file   Social History Narrative   Retired and on disability-since 1999 for Renal failure   Is originally from [place] for 38 yrs-came back here in 2002   Use dto smoke and drink only until age of 31 or 17 yrs of age   Past Marijuana user       Regular exercise-no   Caffeine Use-yes     Constitutional: Denies fever, malaise, fatigue, headache or abrupt weight changes.  HEENT: Denies eye pain, eye redness, ear pain, ringing in the ears, wax buildup, runny nose, nasal congestion, bloody nose, or sore throat. Respiratory: Denies difficulty breathing, shortness of breath, cough or sputum production.   Cardiovascular: Denies chest pain, chest tightness, palpitations or swelling in the hands or feet.  Neurological: Denies dizziness, difficulty with memory, difficulty with speech or problems with balance and coordination.   No other specific complaints in a complete review of systems (except as listed in HPI above).     Objective:   Physical Exam  BP 148/72  Pulse 83  Temp(Src) 97.7 F (36.5 C) (Oral)  Ht 6\' 1"  (1.854 m)  Wt 245 lb (111.131 kg)  BMI 32.33 kg/m2  SpO2 98% Wt Readings from Last 3 Encounters:  05/02/13 245 lb (111.131 kg)  03/15/13 251 lb 5.2 oz (114 kg)  03/05/13 257 lb (116.574 kg)    General: Appears his stated age, obese but well developed, well nourished in NAD. Skin: Warm, dry and intact. No rashes, lesions or ulcerations noted. HEENT: Head: normal shape and size;  Eyes: sclera white, no icterus, conjunctiva pink, PERRLA and EOMs intact; Ears: Tm's gray and intact, normal light reflex; Nose: mucosa pink and moist, septum midline; Throat/Mouth: Teeth present, mucosa pink and moist, no exudate,  lesions or ulcerations noted.  Cardiovascular: Normal rate and rhythm. S1,S2 noted.  No murmur, rubs or gallops noted. No JVD or BLE edema. No carotid bruits noted. Pulmonary/Chest: Normal effort and positive vesicular breath sounds. No respiratory distress. No wheezes, rales or ronchi noted.   Neurological: Alert and oriented. Cranial nerves II-XII intact. Coordination normal. +DTRs bilaterally.   BMET    Component Value Date/Time   NA 134* 03/23/2013 0510   K 4.0 03/23/2013 0510   CL 104 03/23/2013 0510   CO2 20 03/23/2013 0510   GLUCOSE 90 03/23/2013 0510   BUN 28* 03/23/2013 0510   CREATININE 1.60* 03/23/2013 0510   CALCIUM 8.7 03/23/2013 0510   GFRNONAA 44* 03/23/2013 0510   GFRAA 51* 03/23/2013 0510    Lipid Panel  No results found for this basename: chol, trig, hdl, cholhdl, vldl, ldlcalc    CBC    Component Value Date/Time   WBC 6.0 03/21/2013 0450   RBC 3.70* 03/21/2013 0450   HGB 9.5* 03/21/2013 0450   HCT 31.0* 03/21/2013 0450   PLT 308 03/21/2013 0450   MCV 83.8 03/21/2013 0450   MCH 25.7* 03/21/2013 0450   MCHC 30.6 03/21/2013 0450   RDW 14.0 03/21/2013 0450   LYMPHSABS 1.4 03/15/2013 0006   MONOABS 0.5 03/15/2013 0006   EOSABS 0.1 03/15/2013 0006   BASOSABS 0.0 03/15/2013 0006    Hgb A1C Lab Results  Component Value Date   HGBA1C 6.7* 03/06/2013         Assessment & Plan:

## 2013-05-03 ENCOUNTER — Encounter: Payer: Self-pay | Admitting: Internal Medicine

## 2013-05-03 ENCOUNTER — Ambulatory Visit (INDEPENDENT_AMBULATORY_CARE_PROVIDER_SITE_OTHER): Payer: Medicare HMO | Admitting: Internal Medicine

## 2013-05-03 ENCOUNTER — Ambulatory Visit: Payer: Medicare HMO | Admitting: Internal Medicine

## 2013-05-03 ENCOUNTER — Encounter: Payer: Self-pay | Admitting: Cardiology

## 2013-05-03 VITALS — BP 138/72 | HR 85 | Temp 98.3°F | Resp 12 | Ht 73.0 in | Wt 248.0 lb

## 2013-05-03 DIAGNOSIS — E1129 Type 2 diabetes mellitus with other diabetic kidney complication: Secondary | ICD-10-CM

## 2013-05-03 DIAGNOSIS — N058 Unspecified nephritic syndrome with other morphologic changes: Secondary | ICD-10-CM

## 2013-05-03 NOTE — Patient Instructions (Addendum)
Please stay on the same diabetic regimen. Please return in 3 months.

## 2013-05-03 NOTE — Progress Notes (Signed)
Subjective:     Patient ID: [name], male   DOB: 1951/03/09, 62 y.o.   MRN: [id number]  HPI [name] is a 62 year old African American man returning for management of DM2, dx 1970's, insulin-dependent, uncontrolled, with complications (peripheral neuropathy, CKD with history of renal transplant in 2003, gastroparesis, and likely diabetic retinopathy, status post intraocular hemorrhage status post laser surgery). Last visit 2 months ago.  Pt has been on insulin from diagnosis. He developed chronic kidney disease, and was on dialysis for about 3 years before having a kidney transplant 10 years ago: he now takes Prednisone 5, Cellcept, Cyclosporine. He sees Dr. Arrie Aran with nephrology.   Last hemoglobin A1c: Lab Results  Component Value Date   HGBA1C 6.7* 03/06/2013  prior to this 7.2%.  He is on: - Levemir 12 units qhs (decreased from 15 at last visit) - Victoza 1.8 mg (increased from 1.2 mg at last visit (difficult to afford: 300$ per pen (???)) - gets samples mostly  He checks his sugars 2 times a day - am: 78-131, but mostly 80-100 - bedtime: 99-151, but mostly 100-120s  No lows.  He was admitted twice since our last visit for syncope - BP meds have been adjusted and he was started on Pyridostigmine (Mestinon). Feeling beter now and no more syncopes  He has gastroparesis (on Reglan), CKD, peripheral neuropathy (on Gabapentin - 300 mg bid), he has DR and had laser surgery for bleeding. He has R knee pain and was seen by ortho today at [facility] >> explained the higher risk for infection if has TKR, since he is on Immune suppression after his transplant.   He tells me his wife will move back to [place] - he does not want to go back (lived there for 38 years), so they will need to get a divorce.   Review of Systems Constitutional:No weight gain/loss, no fatigue, no subjective hyperthermia/hypothermia Eyes: no blurry vision, no xerophthalmia ENT: no sore throat, no nodules palpated  in throat, no dysphagia/odynophagia, no hoarseness Cardiovascular: no CP/SOB/palpitations/leg swelling Respiratory: no cough/SOB Gastrointestinal: no N/V/D/C Musculoskeletal: no muscle/joint aches Skin: no rashes Neurological: no tremors/numbness/tingling/dizziness Psychiatric: no depression/anxiety  I reviewed pt's medications, allergies, PMH, social hx, family hx and no changes required, except as above.   Insurance prefers [name] testing supplies.  Objective:   Physical Exam BP 138/72  Pulse 85  Temp(Src) 98.3 F (36.8 C) (Oral)  Resp 12  Ht 6\' 1"  (1.854 m)  Wt 248 lb (112.492 kg)  BMI 32.73 kg/m2  SpO2 97% Wt Readings from Last 3 Encounters:  05/03/13 248 lb (112.492 kg)  05/02/13 245 lb (111.131 kg)  03/15/13 251 lb 5.2 oz (114 kg)   Constitutional: overweight, in NAD Eyes: PERRLA, EOMI, no exophthalmos ENT: moist mucous membranes, no thyromegaly, no cervical lymphadenopathy Cardiovascular: RRR, No MRG, + bilateral leg swelling +2 pitting edema, mostly around ankles Respiratory: CTA B Gastrointestinal: abdomen soft, NT, ND, BS+ Musculoskeletal: no deformities, strength intact in all 4 Skin: moist, warm, no rashes Neurological: no tremor with outstretched hands, DTR normal in all 4    Assessment:     1. DM2, insulin-dependent, uncontrolled, with complications  - peripheral neuropathy - CKD with history of renal transplant in 2003 - gastroparesis - likely diabetic retinopathy, status post intraocular hemorrhage status post laser surgery    Plan:     Patient with a very long history of diabetes, doing excellent on the current dose of Levemir 12 units each bedtime, with a.m. sugars  at goal, around 100. Will continue this dose. - His bedtime sugars are also great, at goal  - given discount card for Victoza (to pay 25$ for 1 month supply) - did not try to use it yet. If it does not work with his insurance, will need to change tx, likely to mealtime insulin - for  now, continue Victoza, we might be able to decrease the dose to 1.2 mg at next visit if sugars stay low and he loses more weight.  - Given 5 Victoza pen samples  - I will see the patient back in 3 months with his sugar log - No labs for today, will need to check a hemoglobin A1c at next visit

## 2013-05-03 NOTE — Progress Notes (Signed)
Patient ID: [name], male   DOB: August 08, 1951, 62 y.o.   MRN: [id number]  [name]  Date of visit:  05/03/2013 DOB:  Jul 22, 1951    Age:  62 yrs. Medical record number:  [id number]     Account number:  [id number] Primary Care Provider: Rene Paci ANN ____________________________ CURRENT DIAGNOSES  1. Orthostatic hypotension  2. Hypertensive Heart Disease-Benign without CHF  3. Diabetes Mellitus-with Renal Manifestations IDD  4. Hyperlipidemia  5. Obesity(BMI30-40)  6. Anemia,other specified  7. Sleep Apnea  8. History of kidney transplant ____________________________ ALLERGIES  IVP Dye, Iodine Containing, Intolerance-unknown  Shellfish Derived, Intolerance-unknown ____________________________ MEDICATIONS  1. aspirin 81 mg tablet,chewable, 1 p.o. daily  2. capsaicin 0.025 % cream, qid  3. captopril 25 mg tablet, BID  4. cyclosporine modified 25 mg capsule, 2 bid  5. cyclosporine 100 mg capsule, 2 p.o. b.i.d.  6. fluticasone 50 mcg/actuation spray,suspension, PRN  7. gabapentin 300 mg capsule, BID  8. hydrocodone-acetaminophen 5-325 mg tablet, PRN  9. Levemir Flexpen 100 unit/mL (3 mL) insulin pen, .12 mls q hs  10. Victoza 2-Pak 0.6 mg/0.1 mL (18 mg/3 mL) pen injector, 0.66mls qd  11. metoclopramide HCl 10 mg tablet, 1 p.o. daily  12. mycophenolate mofetil 250 mg capsule, 2 bid  13. omeprazole 20 mg capsule,delayed release(DR/EC), 1 p.o. daily  14. pravastatin 20 mg tablet, 1 p.o. daily  15. prednisone 5 mg tablet, 1 p.o. daily  16. Mestinon 60 mg tablet, TID  17. Allegra 180 mg tablet, PRN  18. Bactrim 400-80 mg tablet, 1 m-w-f ____________________________ CHIEF COMPLAINTS  Followup of Hypertensive Heart Disease-Benign without CHF ____________________________ HISTORY OF PRESENT ILLNESS  Patient seen for cardiac followup. Since he was previously here he has had no recurrent syncope and the current management program for his severe orthostatic hypotension  appears to be keeping his blood pressure as well as his orthostasis under control. He saw the orthopedic [name] at [facility] today and is discouraged because he was told that he was high risk for surgery and complications. He doesn't have any chest pain and denies significant shortness of breath. His sitting blood pressure was 140/80 by me today. He continues to walk with a walker. ____________________________ PAST HISTORY  Past Medical Illnesses:  hypertension, DM-insulin dependent, hyperlipidemia, chronic kidney disease Stage 3, osteoarthritis, sleep apnea, obesity;  Cardiovascular Illnesses:  severe orthostatic hypotension;  Surgical Procedures:  renal transplant 2004;  Cardiology Procedures-Invasive:  no history of prior cardiac procedures;  Cardiology Procedures-Noninvasive:  lexiscan cardiolite April 2014, echocardiogram April 2014;  LVEF of 53% documented via nuclear study on 03/08/2013,   ____________________________ Jacqulyn Bath TEST DATES EKG Date:  04/05/2013;  Nuclear Study Date:  03/08/2013;  Echocardiography Date: 03/06/2013;  Chest Xray Date: 03/18/2013;   ____________________________ FAMILY HISTORY Brother -- Renal failure syndrome, Diabetes mellitus Brother -- Myocardial infarction, Deceased Brother -- Renal failure syndrome, Diabetes mellitus, Hypertension Brother -- Diabetes mellitus, Myocardial infarction, Deceased Father -- Diabetes mellitus, Pulmonary Embolism, Deceased Mother -- Diabetes mellitus, Deceased Sister -- Diabetes mellitus, Hypertension Sister -- Diabetes mellitus, Renal failure syndrome, Deceased Sister -- Coronary Artery Disease, Diabetes mellitus, Hypertension Sister -- Diabetes mellitus, Hypertension Sister -- Carcinoma of breast, Diabetes mellitus, Hypertension Sister -- Diabetes mellitus, Hypertension ____________________________ SOCIAL HISTORY Alcohol Use:  does not use alcohol;  Smoking:  used to smoke but quit;  Diet:  regular diet;   Lifestyle:  married;  Exercise:  exercise is limited due to physical disability;  Occupation:  retired and bus Hospital doctor  landscaping;  Residence:  lives with wife;   ____________________________ REVIEW OF SYSTEMS General:  obesity, malaise and fatigue Eyes: denies diplopia, history of glaucoma or visual problems. Respiratory: denies dyspnea, cough, wheezing or hemoptysis. Cardiovascular:  please review HPI Abdominal: gastroparesis Genitourinary-Male: frequency, nocturia  Musculoskeletal:  severe knee pain Neurological:  denies headaches, stroke, or TIA  ____________________________ PHYSICAL EXAMINATION VITAL SIGNS  Blood Pressure:  170/80 Sitting, Right arm, regular cuff   Pulse:  82/min. Weight:  247.00 lbs. Height:  73"BMI: 32  Constitutional:  pleasant African Americian male in no acute distress, moderately obese walks with walker Skin:  warm and dry to touch, no apparent skin lesions, or masses noted. Head:  normocephalic, normal hair pattern, no masses or tenderness ENT:  ears, nose and throat reveal no gross abnormalities.  Dentition good. Neck:  supple, without massess. No JVD, thyromegaly or carotid bruits. Carotid upstroke normal. Chest:  normal symmetry, clear to auscultation and percussion. Cardiac:  regular rhythm, normal S1 and S2, No S3 or S4, no murmurs, gallops or rubs detected. Peripheral Pulses:  the femoral,dorsalis pedis, and posterior tibial pulses are full and equal bilaterally with no bruits auscultated. Extremities & Back:  1+ edema Neurological:  no gross motor or sensory deficits noted, affect appropriate, oriented x3. ____________________________ IMPRESSIONS/PLAN  1. Severe orthostatic hypotension 2. Hypertensive heart disease 3. Prior renal transplant  Recommendations:  His orthostasis is under reasonable control at this time. Blood pressure appears to be fairly well-controlled at this time. From a cardiac viewpoint he would be reasonable to proceed with his  knee replacement if the surgeons felt that he would be a low-risk candidate from an infection viewpoint. I will plan to see him in 3 months. ____________________________ TODAYS ORDERS  1. Return Visit: 3 months                       ____________________________ Cardiology Physician:  Darden Palmer MD [facility]

## 2013-05-04 ENCOUNTER — Telehealth: Payer: Self-pay | Admitting: Internal Medicine

## 2013-05-04 NOTE — Telephone Encounter (Signed)
rec'd from [email or web address] forward 4 pages Dr.Leschber

## 2013-07-04 ENCOUNTER — Telehealth: Payer: Self-pay | Admitting: Internal Medicine

## 2013-07-15 ENCOUNTER — Encounter: Payer: Self-pay | Admitting: Internal Medicine

## 2013-07-19 ENCOUNTER — Telehealth: Payer: Self-pay | Admitting: *Deleted

## 2013-07-19 NOTE — Telephone Encounter (Signed)
Pt called states he was prescribed Polyethylene Glycol 3350 MF upon discharge from hospital, requesting a refill of same or something similar.  Please advise

## 2013-07-20 ENCOUNTER — Other Ambulatory Visit: Payer: Self-pay | Admitting: *Deleted

## 2013-07-20 MED ORDER — POLYETHYLENE GLYCOL 3350 17 GM/SCOOP PO POWD: 17.0000 g | Freq: Two times a day (BID) | ORAL | Status: AC | PRN

## 2013-07-20 NOTE — Telephone Encounter (Signed)
Spoke with pt advised Rx sent to pharmacy. 

## 2013-07-20 NOTE — Telephone Encounter (Signed)
Ol to refill

## 2013-08-05 ENCOUNTER — Ambulatory Visit: Payer: Medicare HMO | Admitting: Internal Medicine

## 2013-09-06 ENCOUNTER — Telehealth: Payer: Self-pay | Admitting: Internal Medicine

## 2013-09-06 NOTE — Telephone Encounter (Signed)
Pt called requesting samples of Victoza and Levemir. CB# [phone] / Sherri S.

## 2013-09-06 NOTE — Telephone Encounter (Signed)
Returned call to pt and told him the samples will be in the fridge for him to pick up. Pt understood.

## 2013-09-22 ENCOUNTER — Other Ambulatory Visit: Payer: Self-pay

## 2013-09-29 NOTE — Telephone Encounter (Signed)
Phone note completed ° °

## 2013-11-22 ENCOUNTER — Ambulatory Visit: Payer: Medicare HMO | Admitting: Internal Medicine

## 2014-01-25 ENCOUNTER — Telehealth: Payer: Self-pay | Admitting: Internal Medicine

## 2014-01-25 NOTE — Telephone Encounter (Signed)
Pt states would like to know if he could have levemir and victoza samples  3 of each   Call back: [phone]  Thank You :)

## 2014-01-25 NOTE — Telephone Encounter (Signed)
Returned pt's call and advised him that he could get samples of victoza and levemir. Pt to pick up today.

## 2014-01-27 ENCOUNTER — Ambulatory Visit: Payer: Medicare HMO | Admitting: Internal Medicine

## 2014-03-17 ENCOUNTER — Telehealth: Payer: Self-pay | Admitting: Internal Medicine

## 2014-03-17 NOTE — Telephone Encounter (Signed)
Referral # [id number] start date 03/20/14 exp 06/18/14 good for 4 visits

## 2014-03-17 NOTE — Telephone Encounter (Signed)
Referral for Dr Vilinda BlanksWilliam  S. Donnie Ahoilley faxed to [employer] @ [phone], awaiting approval

## 2014-03-21 ENCOUNTER — Ambulatory Visit: Payer: Medicare HMO | Admitting: Internal Medicine

## 2014-03-22 ENCOUNTER — Telehealth: Payer: Self-pay | Admitting: Internal Medicine

## 2014-03-22 NOTE — Telephone Encounter (Signed)
Rec'd from W. Viann FishSpencer Tilley forward 4 pages to Barnes & NobleDr.Leschber

## 2014-04-21 ENCOUNTER — Encounter: Payer: Self-pay | Admitting: Internal Medicine

## 2014-04-21 ENCOUNTER — Ambulatory Visit (INDEPENDENT_AMBULATORY_CARE_PROVIDER_SITE_OTHER): Payer: Commercial Managed Care - HMO | Admitting: Internal Medicine

## 2014-04-21 VITALS — BP 122/82 | HR 68 | Temp 97.5°F | Resp 12 | Wt 244.0 lb

## 2014-04-21 DIAGNOSIS — E1129 Type 2 diabetes mellitus with other diabetic kidney complication: Secondary | ICD-10-CM

## 2014-04-21 LAB — HEMOGLOBIN A1C: HEMOGLOBIN A1C: 6.4 % (ref 4.6–6.5)

## 2014-04-21 MED ORDER — INSULIN DETEMIR 100 UNIT/ML ~~LOC~~ SOLN: 8.0000 [IU] | Freq: Every day | SUBCUTANEOUS | Status: AC

## 2014-04-21 MED ORDER — LIRAGLUTIDE 18 MG/3ML ~~LOC~~ SOPN: PEN_INJECTOR | SUBCUTANEOUS | Status: AC

## 2014-04-21 NOTE — Patient Instructions (Addendum)
Continue Levemir 8-10 units at bedtime. Continue Victoza, but increase to 1.2 mg daily if sugars at bedtime >170 consistently  Please come back for a follow-up appointment in 3 months.  Please check with your pharmacist or the insurance whether Bydureon, Trulicity, Tanzeum are cheaper than Victoza.

## 2014-04-21 NOTE — Progress Notes (Signed)
Subjective:     Patient ID: [name], male   DOB: 1951/05/22, 63 y.o.   MRN: [id number]  HPI Mr Klecka is a 63 y.o. African American man returning for management of DM2, dx 1970's, insulin-dependent, uncontrolled, with complications (peripheral neuropathy, CKD with history of renal transplant in 2003, gastroparesis, and likely diabetic retinopathy, status post intraocular hemorrhage status post laser surgery). Last visit 1 year ago!  Pt has been on insulin from diagnosis. He developed chronic kidney disease, and was on dialysis for about 3 years before having a kidney transplant 11 years ago: he now takes Prednisone 5, Cellcept, Cyclosporine. He sees Dr. Arrie Aran with nephrology.   Last hemoglobin A1c: Lab Results  Component Value Date   HGBA1C 6.7* 03/06/2013  prior to this 7.2%.  He is on: - Levemir pen 8 mostly-10 units qhs.  Uses 2 pens a mo. - Victoza 0.6-0.8 mg (difficult to afford: 300$ per pen (???)) - gets samples mostly. Uses 2 pens a mo.  He checks his sugars 2 times a day: - am: 78-131, but mostly 80-100 >> 79-126 - bedtime: 99-151, but mostly 100-120s >> 84-170, mostly <140  No lows.  - He has gastroparesis (on Reglan) - He has peripheral neuropathy (on Gabapentin - 300 mg bid) - He has DR and had laser surgery for bleeding. Dr Melene Muller [facility] [place]). - He has CKD. He sees Dr Arrie Aran >> labs checked yesterday >> will ask for records Last BUN/Cr: Lab Results  Component Value Date   BUN 28* 03/23/2013   Lab Results  Component Value Date   CREATININE 1.60* 03/23/2013   He has R knee pain and was seen by ortho today at [facility] >> explained the higher risk for infection if has TKR, since he is on Immune suppression after his transplant.   He tells me his wife moved back to [place] - he does not want to go back (lived there for 38 years).   Review of Systems Constitutional:No weight gain/loss, no fatigue, no subjective hyperthermia/hypothermia Eyes: no blurry  vision, no xerophthalmia ENT: no sore throat, no nodules palpated in throat, no dysphagia/odynophagia, no hoarseness Cardiovascular: no CP/SOB/palpitations/leg swelling Respiratory: no cough/SOB Gastrointestinal: no N/V/D/C Musculoskeletal: no muscle/joint aches Skin: no rashes Neurological: no tremors/numbness/tingling/dizziness Psychiatric: no depression/anxiety  I reviewed pt's medications, allergies, PMH, social hx, family hx and no changes required, except as above.   Insurance prefers [employer] testing supplies.  Objective:   Physical Exam BP 122/82  Pulse 68  Temp(Src) 97.5 F (36.4 C) (Oral)  Resp 12  Wt 244 lb (110.678 kg)  SpO2 97% Wt Readings from Last 3 Encounters:  04/21/14 244 lb (110.678 kg)  05/03/13 248 lb (112.492 kg)  05/02/13 245 lb (111.131 kg)   Constitutional: overweight, in NAD Eyes: PERRLA, EOMI, no exophthalmos ENT: moist mucous membranes, no thyromegaly, no cervical lymphadenopathy Cardiovascular: RRR, No MRG, + bilateral leg swelling +2 pitting edema, mostly around ankles Respiratory: CTA B Gastrointestinal: abdomen soft, NT, ND, BS+ Musculoskeletal: no deformities, strength intact in all 4 Skin: moist, warm, no rashes Neurological: no tremor with outstretched hands, DTR normal in all 4    Assessment:     1. DM2, insulin-dependent, uncontrolled, with complications  - peripheral neuropathy - CKD with history of renal transplant in 2003 - gastroparesis - likely diabetic retinopathy, status post intraocular hemorrhage status post laser surgery    Plan:     Patient with a very long history of diabetes, returning after a long absence. - He  isdoing excellent on the current dose of Levemir 8-10 units at bedtime, with a.m. sugars at goal, around 100. Will continue this dose.  - His bedtime sugars are also great, but higher than before >> I advised him to increase the dose of Victoza to 1.2 mg daily if they stay mostly in the >170 range - given  discount card for Victoza (to pay 25$ for 1 month supply) - given Levemir 2 saple pens - will check HbA1c today - I will see the patient back in 3 months with his sugar log  Office Visit on 04/21/2014  Component Date Value Ref Range Status  . Hemoglobin A1C 04/21/2014 6.4  4.6 - 6.5 % Final   Glycemic Control Guidelines for People with Diabetes:Non Diabetic:  <6%Goal of Therapy: <7%Additional Action Suggested:  >8%   Excellent A1c!

## 2014-06-02 ENCOUNTER — Telehealth: Payer: Self-pay

## 2014-06-02 NOTE — Telephone Encounter (Signed)
LVM for pt to call back and schedule 3 mo follow up with Elvera LennoxGherghe, MD.  Trula Ore/Diabetic Bundle pt.

## 2014-09-11 ENCOUNTER — Encounter: Payer: Self-pay | Admitting: Neurology

## 2014-09-11 ENCOUNTER — Ambulatory Visit (INDEPENDENT_AMBULATORY_CARE_PROVIDER_SITE_OTHER): Payer: Commercial Managed Care - HMO | Admitting: Neurology

## 2014-09-11 VITALS — Temp 96.9°F | Ht 71.0 in | Wt 244.0 lb

## 2014-09-11 DIAGNOSIS — N186 End stage renal disease: Secondary | ICD-10-CM

## 2014-09-11 DIAGNOSIS — G4730 Sleep apnea, unspecified: Secondary | ICD-10-CM

## 2014-09-11 DIAGNOSIS — R0683 Snoring: Secondary | ICD-10-CM

## 2014-09-11 DIAGNOSIS — Z940 Kidney transplant status: Secondary | ICD-10-CM

## 2014-09-11 DIAGNOSIS — Z7189 Other specified counseling: Secondary | ICD-10-CM

## 2014-09-11 DIAGNOSIS — G4710 Hypersomnia, unspecified: Secondary | ICD-10-CM

## 2014-09-11 NOTE — Patient Instructions (Signed)
Sleep Apnea  Sleep apnea is a sleep disorder characterized by abnormal pauses in breathing while you sleep. When your breathing pauses, the level of oxygen in your blood decreases. This causes you to move out of deep sleep and into light sleep. As a result, your quality of sleep is poor, and the system that carries your blood throughout your body (cardiovascular system) experiences stress. If sleep apnea remains untreated, the following conditions can develop:  High blood pressure (hypertension).  Coronary artery disease.  Inability to achieve or maintain an erection (impotence).  Impairment of your thought process (cognitive dysfunction). There are three types of sleep apnea: 1. Obstructive sleep apnea--Pauses in breathing during sleep because of a blocked airway. 2. Central sleep apnea--Pauses in breathing during sleep because the area of the brain that controls your breathing does not send the correct signals to the muscles that control breathing. 3. Mixed sleep apnea--A combination of both obstructive and central sleep apnea. RISK FACTORS The following risk factors can increase your risk of developing sleep apnea:  Being overweight.  Smoking.  Having narrow passages in your nose and throat.  Being of older age.  Being male.  Alcohol use.  Sedative and tranquilizer use.  Ethnicity. Among individuals younger than 35 years, African Americans are at increased risk of sleep apnea. SYMPTOMS   Difficulty staying asleep.  Daytime sleepiness and fatigue.  Loss of energy.  Irritability.  Loud, heavy snoring.  Morning headaches.  Trouble concentrating.  Forgetfulness.  Decreased interest in sex. DIAGNOSIS  In order to diagnose sleep apnea, your caregiver will perform a physical examination. Your caregiver may suggest that you take a home sleep test. Your caregiver may also recommend that you spend the night in a sleep lab. In the sleep lab, several monitors record  information about your heart, lungs, and brain while you sleep. Your leg and arm movements and blood oxygen level are also recorded. TREATMENT The following actions may help to resolve mild sleep apnea:  Sleeping on your side.   Using a decongestant if you have nasal congestion.   Avoiding the use of depressants, including alcohol, sedatives, and narcotics.   Losing weight and modifying your diet if you are overweight. There also are devices and treatments to help open your airway:  Oral appliances. These are custom-made mouthpieces that shift your lower jaw forward and slightly open your bite. This opens your airway.  Devices that create positive airway pressure. This positive pressure "splints" your airway open to help you breathe better during sleep. The following devices create positive airway pressure:  Continuous positive airway pressure (CPAP) device. The CPAP device creates a continuous level of air pressure with an air pump. The air is delivered to your airway through a mask while you sleep. This continuous pressure keeps your airway open.  Nasal expiratory positive airway pressure (EPAP) device. The EPAP device creates positive air pressure as you exhale. The device consists of single-use valves, which are inserted into each nostril and held in place by adhesive. The valves create very little resistance when you inhale but create much more resistance when you exhale. That increased resistance creates the positive airway pressure. This positive pressure while you exhale keeps your airway open, making it easier to breath when you inhale again.  Bilevel positive airway pressure (BPAP) device. The BPAP device is used mainly in patients with central sleep apnea. This device is similar to the CPAP device because it also uses an air pump to deliver continuous air pressure   through a mask. However, with the BPAP machine, the pressure is set at two different levels. The pressure when you  exhale is lower than the pressure when you inhale.  Surgery. Typically, surgery is only done if you cannot comply with less invasive treatments or if the less invasive treatments do not improve your condition. Surgery involves removing excess tissue in your airway to create a wider passage way. Document Released: 10/24/2002 Document Revised: 02/28/2013 Document Reviewed: 03/11/2012 [employer] Patient Information 2015 [employer]. This information is not intended to replace advice given to you by your health care provider. Make sure you discuss any questions you have with your health care provider.  

## 2014-09-11 NOTE — Progress Notes (Signed)
SLEEP MEDICINE CLINIC   Provider:  Melvyn Novas, M D  Referring Provider: [name], MD  Primary Care Physician:  Nicki Reaper, NP  Chief Complaint  Patient presents with  . NP Sleep    Rm 10, alone    HPI:  [name] is a 63 y.o. male  Is seen here as a referral/ revisit  from [facility] for a sleep consultation,    [name] was first diagnosed with obstructive sleep apnea about 15 years ago in [place] and he was afterwards placed on a CPAP machine. He is still using CPAP but  other healthcare concerns half arisen: He is treated for diabetes mellitus by Dr. Rene Paci,  he has degenerative joint disease and is followed by Dr. Magnus Ivan orthopedist.  His hypertension and immune therapy are provided through Dr. Amanda Pea his kidney specialist.  The patient received a kidney transplant from a relative, living donor in 19-2 06-2003. The transplant was performed at [facility]. He regained good creatinine clearance. Baseline creatinine was 1.4. He has tight glucose control.  He has avoided taking nonsteroidal pain medicines or Cox 2 inhibitors.  His kidney specialist, Dr.  [name] would like him to be evaluated , the question is that if he still needs his CPAP.  [name] undergone a split night polysomnography on 10-26-2006 about 8 years ago under my direct are sharp. The patient presented with central apneas and an AHI of 30.8 hypoxemia but responded well to plain CPAP at the time titrated to 8 cm of water and his AHI was 0 as well as his snoring.  I was also able to see his most recent lab results his CBC and differential are impressive "normal creatinine was 1.31 which is a very good result, glomerular filtration rate is 57 BUN/creatinine ratio was 19. Normal electrolytes and protein content AST and ALT and normal range. Bilirubin was normal. Normal urine sample recently evaluated.   The sleep habits are as  follows; he goes ot bed at 11 Pm and sleeps mostly until 5 AM and prays. He will promptly go to sleep ( 20 minutes) asleep except for 3-4 nocturia breaks.  He has not used CPA for the last 3 nights and felt no decremental effect.  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 He lost weight  ( 80 pounds in 6 years ) and his diabetes is well controlled.   He may nap again for 90 minues afterwards. He will have an afternoon naps of an hour as well.   Review of Systems: Out of a complete 14 system review, the patient complains of only the following symptoms, and all other reviewed systems are negative.  nap times needed. But nocturia is main sleep interruption. Not snoring. Not longer having a  dry mouth.    Epworth score  15 , Fatigue severity score 9  , geriatric  depression score ;zero.   History   Social History  . Marital Status: Married    Spouse Name: N/A    Number of Children: N/A  . Years of Education: 16   Occupational History  . Retired     Art gallery manager   Social History Main Topics  . Smoking status: Former Games developer  . Smokeless tobacco: Never Used  . Alcohol Use: No  . Drug Use: No  . Sexual Activity: No   Other Topics Concern  .  Not on file   Social History Narrative   Retired and on disability-since 1999 for Renal failure   Is originally from [place] for [place]rs-came back here in 2002   Use dto smoke and drink only until age of 63 or [age] yrs of age   Past Marijuana user       Regular exercise-no   Caffeine Use-yes    Family History  Problem Relation Age of Onset  . Diabetes Mother   . Diabetes Father   . Kidney failure Brother   . Heart disease Brother   . Heart failure Sister   . Kidney failure Sister   . Heart disease Sister   . Prostate cancer Paternal Uncle   . Cancer Neg Hx   . Kidney failure Brother   . Kidney failure Brother   . Hyperlipidemia Other     Family History  . Hypertension Other     Family  History  . Diabetes Other     Family History    Past Medical History  Diagnosis Date  . Diabetes mellitus with renal manifestations, controlled   . Hypertensive heart disease   . Kidney transplant as cause of abnormal reaction or later complication     In [name]-Dr. Colodonato-Dr. Lawerance CruelFarning is the Trans[facility]  . Hyperlipidemia   . Chronic kidney disease   . Obesity (BMI 30-39.9)   . Anemia of chronic disease 02/05/2013  . Orthostatic syncope 01/10/2013  . Diabetic gastroparesis associated with type 1 diabetes mellitus 03/06/2013  . Retinopathy due to secondary diabetes   . Sleep apnea 02/13/2009  . Arthritis   . History of chicken pox   . Weight loss     Past Surgical History  Procedure Laterality Date  . Kidney transplant  2004    Current Outpatient Prescriptions  Medication Sig Dispense Refill  . aspirin EC 81 MG tablet Take 81 mg by mouth every morning.       . BD PEN NEEDLE NANO U/F 32G X 4 MM MISC       . cycloSPORINE modified (NEORAL) 100 MG capsule Take 100 mg by mouth 2 (two) times daily. In addition to 50mg  twice daily for total 150 mg twice daily.      . cycloSPORINE modified (NEORAL) 25 MG capsule Take 50 mg by mouth 2 (two) times daily. In addition to 100mg  po twice daily for total dose of 150mg  twice daily.      . fluticasone (FLONASE) 50 MCG/ACT nasal spray Place 1 spray into the nose daily as needed. Allergies or sinus congestion.      . gabapentin (NEURONTIN) 300 MG capsule Take 1 capsule (300 mg total) by mouth 2 (two) times daily.  180 capsule  3  . HYDROcodone-acetaminophen (NORCO) 10-325 MG per tablet Take 1 tablet by mouth every 8 (eight) hours as needed for pain.  90 tablet  0  . insulin detemir (LEVEMIR) 100 UNIT/ML injection Inject 0.08-0.1 mLs (8-10 Units total) into the skin at bedtime.  6 mL  3  . Liraglutide 18 MG/3ML SOPN Inject up to 1.2 mg daily  6 mL  3  . mycophenolate (CELLCEPT) 250 MG capsule Take 500 mg by mouth 2 (two) times  daily.       . Olopatadine HCl 0.2 % SOLN Apply 1 drop to eye daily.  2.5 mL  0  . omeprazole (PRILOSEC) 20 MG capsule Take 20 mg by mouth daily.        . polyethylene  glycol powder (GLYCOLAX/MIRALAX) powder Take 17 g by mouth 2 (two) times daily as needed.  3350 g  1  . predniSONE (DELTASONE) 5 MG tablet Take 5 mg by mouth daily.      Marland Kitchen sulfamethoxazole-trimethoprim (BACTRIM,SEPTRA) 400-80 MG per tablet Take 1 tablet by mouth every Monday, Wednesday, and Friday.       No current facility-administered medications for this visit.    Allergies as of 09/11/2014 - Review Complete 09/11/2014  Allergen Reaction Noted  . Iodine Anaphylaxis 10/13/2011  . Shellfish allergy Anaphylaxis and Swelling 10/13/2011    Vitals: Temp(Src) 96.9 F (36.1 C) (Oral)  Ht 5\' 11"  (1.803 m)  Wt 244 lb (110.678 kg)  BMI 34.05 kg/m2 Last Weight:  Wt Readings from Last 1 Encounters:  09/11/14 244 lb (110.678 kg)       Last Height:   Ht Readings from Last 1 Encounters:  09/11/14 5\' 11"  (1.803 m)    Physical exam:  General: The patient is awake, alert and appears not in acute distress. The patient is well groomed. Head: Normocephalic, atraumatic. Neck is supple. Mallampati 4 , uvula not seen, macroglossia.  neck circumference: 17.5 . Nasal airflow unrestricted , TMJ is  Not evident . Retrognathia is mild .  Cardiovascular:  Regular rate and rhythm , without  murmurs or carotid bruit, and without distended neck veins. Respiratory: Lungs are clear to auscultation. Skin:  Without evidence of edema, or rash Trunk: BMI is still  Elevated but  patient  has normal posture.  Neurologic exam : The patient is awake and alert, oriented to place and time.   Memory subjective  described as intact. There is a normal attention span & concentration ability.  Speech is fluent without  dysarthria, dysphonia or aphasia. Mood and affect are appropriate.  Cranial nerves: Pupils are equal and briskly reactive to light.    Funduscopic exam without  evidence of pallor or edema. Extraocular movements  in vertical and horizontal planes intact and without nystagmus.  Visual fields by finger perimetry are intact. Hearing to finger rub intact. Facial sensation intact to fine touch. Facial motor strength is symmetric and tongue and uvula move midline.  Motor exam:   Normal tone ,muscle bulk and strength in all extremities.  No grip strength loss, he used a graft shunt on the left arm while on haemodialysis for four year of treatment .   Sensory:  Fine touch, pinprick and vibration were tested in all extremities. Proprioception is  normal.  Coordination: Rapid alternating movements in the fingers/hands is normal.  Finger-to-nose maneuver  normal without evidence of ataxia, dysmetria or tremor.  Gait and station: Patient walks without assistive device and is able unassisted to climb up to the exam table.  Strength within normal limits. Stance is stable and normal.   Deep tendon reflexes: in the  upper and lower extremities are symmetric and intact. Babinski maneuver response is downgoing.   Assessment:  After physical and neurologic examination, review of laboratory studies, imaging, neurophysiology testing and pre-existing records, assessment is;  1)  This patient still has risk factors , high Mallampati, neck size and BMI. Hypertension. 2)  His Epworth is high, 15 points. He is not fatigued but naps frequently, the Epworth reflects this.  3)  CPAP investigated here. Download here    The patient was advised of the nature of the diagnosed sleep disorder , the treatment options and risks for general a health and wellness arising from not treating the condition. Visit duration  was 35  minutes.  Answered questions about auto-titrator use, treatment of mild apnea with dental devices .I ordered a Split study to verify how much apnea is present. Rv after study and results.    Porfirio Mylararmen Havilah Topor  MD  09/11/2014

## 2014-09-11 NOTE — Addendum Note (Signed)
Addended by: Melvyn NovasHMEIER, Niko Jakel on: 09/11/2014 12:42 PM   Modules accepted: Orders

## 2014-10-17 ENCOUNTER — Telehealth: Payer: Self-pay | Admitting: Internal Medicine

## 2014-10-18 NOTE — Telephone Encounter (Signed)
Please read note below

## 2014-10-18 NOTE — Telephone Encounter (Signed)
Sorry to hear that.

## 2014-10-23 ENCOUNTER — Ambulatory Visit: Payer: Medicare HMO | Admitting: Internal Medicine

## 2014-11-17 NOTE — Telephone Encounter (Signed)
Pt is deceased he passed away today

## 2014-11-17 DEATH — deceased
